# Patient Record
Sex: Male | Born: 1950 | Race: White | Hispanic: No | Marital: Married | Smoking: Never smoker
Health system: Southern US, Community
[De-identification: ages and names within clinical notes are randomized; demographics above are authoritative.]

## PROBLEM LIST (undated history)

## (undated) DIAGNOSIS — G894 Chronic pain syndrome: Secondary | ICD-10-CM

## (undated) DIAGNOSIS — M5430 Sciatica, unspecified side: Secondary | ICD-10-CM

## (undated) DIAGNOSIS — I10 Essential (primary) hypertension: Secondary | ICD-10-CM

## (undated) DIAGNOSIS — N509 Disorder of male genital organs, unspecified: Secondary | ICD-10-CM

## (undated) DIAGNOSIS — M545 Low back pain: Secondary | ICD-10-CM

## (undated) HISTORY — PX: CYSTOSCOPY WITH INSERTION OF UROLIFT: SHX6678

## (undated) HISTORY — DX: Sciatica, unspecified side: M54.30

## (undated) HISTORY — DX: Low back pain: M54.5

## (undated) HISTORY — DX: Chronic pain syndrome: G89.4

## (undated) HISTORY — DX: Disorder of male genital organs, unspecified: N50.9

---

## 2004-01-06 ENCOUNTER — Ambulatory Visit: Payer: Self-pay | Admitting: Pain Medicine

## 2005-01-04 ENCOUNTER — Ambulatory Visit: Payer: Self-pay | Admitting: Pain Medicine

## 2005-08-16 DIAGNOSIS — N509 Disorder of male genital organs, unspecified: Secondary | ICD-10-CM | POA: Insufficient documentation

## 2005-08-16 HISTORY — DX: Disorder of male genital organs, unspecified: N50.9

## 2006-01-03 ENCOUNTER — Ambulatory Visit: Payer: Self-pay | Admitting: Pain Medicine

## 2006-01-27 ENCOUNTER — Other Ambulatory Visit: Payer: Self-pay

## 2006-01-27 ENCOUNTER — Emergency Department: Payer: Self-pay | Admitting: Emergency Medicine

## 2006-10-21 ENCOUNTER — Emergency Department: Payer: Self-pay

## 2006-12-05 ENCOUNTER — Ambulatory Visit: Payer: Self-pay | Admitting: Pain Medicine

## 2007-11-13 ENCOUNTER — Ambulatory Visit: Payer: Self-pay | Admitting: Pain Medicine

## 2007-11-15 ENCOUNTER — Ambulatory Visit: Payer: Self-pay | Admitting: Pain Medicine

## 2008-03-23 ENCOUNTER — Inpatient Hospital Stay: Payer: Self-pay | Admitting: Internal Medicine

## 2008-03-24 ENCOUNTER — Encounter: Payer: Self-pay | Admitting: Internal Medicine

## 2008-11-18 ENCOUNTER — Ambulatory Visit: Payer: Self-pay | Admitting: Pain Medicine

## 2009-01-17 DIAGNOSIS — M545 Low back pain, unspecified: Secondary | ICD-10-CM

## 2009-01-17 DIAGNOSIS — G8929 Other chronic pain: Secondary | ICD-10-CM | POA: Insufficient documentation

## 2009-01-17 HISTORY — DX: Low back pain, unspecified: M54.50

## 2009-02-01 HISTORY — PX: BACK SURGERY: SHX140

## 2009-02-04 ENCOUNTER — Ambulatory Visit: Payer: Self-pay

## 2009-02-26 ENCOUNTER — Inpatient Hospital Stay (HOSPITAL_COMMUNITY): Admission: AD | Admit: 2009-02-26 | Discharge: 2009-03-01 | Payer: Self-pay | Admitting: Neurosurgery

## 2009-11-05 ENCOUNTER — Ambulatory Visit: Payer: Self-pay | Admitting: Pain Medicine

## 2009-11-11 ENCOUNTER — Other Ambulatory Visit: Payer: Self-pay | Admitting: Pain Medicine

## 2010-01-19 LAB — HM COLONOSCOPY

## 2010-04-20 LAB — COMPREHENSIVE METABOLIC PANEL
ALT: 85 U/L — ABNORMAL HIGH (ref 0–53)
AST: 31 U/L (ref 0–37)
AST: 63 U/L — ABNORMAL HIGH (ref 0–37)
BUN: 13 mg/dL (ref 6–23)
CO2: 24 mEq/L (ref 19–32)
CO2: 25 mEq/L (ref 19–32)
CO2: 25 mEq/L (ref 19–32)
Calcium: 8.2 mg/dL — ABNORMAL LOW (ref 8.4–10.5)
Calcium: 8.4 mg/dL (ref 8.4–10.5)
Chloride: 109 mEq/L (ref 96–112)
Creatinine, Ser: 0.68 mg/dL (ref 0.4–1.5)
Creatinine, Ser: 0.76 mg/dL (ref 0.4–1.5)
Creatinine, Ser: 0.9 mg/dL (ref 0.4–1.5)
GFR calc Af Amer: >60 mL/min (ref >60)
GFR calc non Af Amer: >60 mL/min (ref >60)
GFR calc non Af Amer: >60 mL/min (ref >60)
GFR calc non Af Amer: >60 mL/min (ref >60)
Glucose, Bld: 121 mg/dL — ABNORMAL HIGH (ref 70–99)
Total Bilirubin: 0.4 mg/dL (ref 0.3–1.2)
Total Protein: 6.4 g/dL (ref 6.0–8.3)

## 2010-04-20 LAB — CBC
HCT: 37.1 % — ABNORMAL LOW (ref 39.0–52.0)
Hemoglobin: 12.4 g/dL — ABNORMAL LOW (ref 13.0–17.0)
Hemoglobin: 13.5 g/dL (ref 13.0–17.0)
MCHC: 33.4 g/dL (ref 30.0–36.0)
MCHC: 34.1 g/dL (ref 30.0–36.0)
MCHC: 35.5 g/dL (ref 30.0–36.0)
MCV: 89.2 fL (ref 78.0–100.0)
MCV: 90.7 fL (ref 78.0–100.0)
Platelets: 263 10*3/uL (ref 150–400)
Platelets: 267 10*3/uL (ref 150–400)
RBC: 4.35 MIL/uL (ref 4.22–5.81)
RDW: 13.7 % (ref 11.5–15.5)
WBC: 12.8 10*3/uL — ABNORMAL HIGH (ref 4.0–10.5)
WBC: 5.2 10*3/uL (ref 4.0–10.5)

## 2010-04-20 LAB — CULTURE, BLOOD (ROUTINE X 2)

## 2010-04-20 LAB — URINALYSIS, ROUTINE W REFLEX MICROSCOPIC
Bilirubin Urine: NEGATIVE
Hgb urine dipstick: NEGATIVE
Ketones, ur: 15 mg/dL — AB
Nitrite: NEGATIVE
Protein, ur: NEGATIVE mg/dL
Specific Gravity, Urine: 1.014 (ref 1.005–1.030)
Urobilinogen, UA: 1 mg/dL (ref 0.0–1.0)

## 2010-04-20 LAB — DIFFERENTIAL
Eosinophils Absolute: 0 10*3/uL (ref 0.0–0.7)
Eosinophils Absolute: 0.1 10*3/uL (ref 0.0–0.7)
Eosinophils Relative: 0 % (ref 0–5)
Eosinophils Relative: 2 % (ref 0–5)
Lymphs Abs: 0.9 10*3/uL (ref 0.7–4.0)
Lymphs Abs: 1.5 10*3/uL (ref 0.7–4.0)
Monocytes Relative: 14 % — ABNORMAL HIGH (ref 3–12)
Monocytes Relative: 6 % (ref 3–12)

## 2010-04-20 LAB — HEPATITIS PANEL, ACUTE: Hepatitis B Surface Ag: NEGATIVE

## 2010-04-20 LAB — SEDIMENTATION RATE: Sed Rate: 32 mm/hr — ABNORMAL HIGH (ref 0–16)

## 2010-10-12 ENCOUNTER — Ambulatory Visit: Payer: Self-pay | Admitting: Pain Medicine

## 2010-10-13 ENCOUNTER — Other Ambulatory Visit: Payer: Self-pay | Admitting: Pain Medicine

## 2011-10-12 ENCOUNTER — Ambulatory Visit: Payer: Self-pay | Admitting: Pain Medicine

## 2011-10-14 ENCOUNTER — Other Ambulatory Visit: Payer: Self-pay | Admitting: Pain Medicine

## 2011-10-14 LAB — HEPATIC FUNCTION PANEL A (ARMC)
Alkaline Phosphatase: 61 U/L (ref 50–136)
SGOT(AST): 26 U/L (ref 15–37)
SGPT (ALT): 25 U/L (ref 12–78)
Total Protein: 7 g/dL (ref 6.4–8.2)

## 2011-10-14 LAB — RENAL FUNCTION PANEL
Albumin: 3.9 g/dL (ref 3.4–5.0)
BUN: 16 mg/dL (ref 7–18)
Chloride: 105 mmol/L (ref 98–107)
Co2: 27 mmol/L (ref 21–32)
Phosphorus: 3.3 mg/dL (ref 2.5–4.9)

## 2012-10-09 ENCOUNTER — Ambulatory Visit: Payer: Self-pay | Admitting: Pain Medicine

## 2013-04-06 ENCOUNTER — Ambulatory Visit: Payer: Self-pay | Admitting: Pain Medicine

## 2013-10-05 ENCOUNTER — Ambulatory Visit: Payer: Self-pay | Admitting: Pain Medicine

## 2014-08-26 ENCOUNTER — Ambulatory Visit (INDEPENDENT_AMBULATORY_CARE_PROVIDER_SITE_OTHER): Payer: Managed Care, Other (non HMO) | Admitting: Family Medicine

## 2014-08-26 ENCOUNTER — Encounter: Payer: Self-pay | Admitting: Family Medicine

## 2014-08-26 VITALS — BP 134/74 | HR 68 | Temp 98.9°F | Resp 16 | Wt 199.2 lb

## 2014-08-26 DIAGNOSIS — K648 Other hemorrhoids: Secondary | ICD-10-CM | POA: Diagnosis not present

## 2014-08-26 DIAGNOSIS — G894 Chronic pain syndrome: Secondary | ICD-10-CM | POA: Insufficient documentation

## 2014-08-26 DIAGNOSIS — Z7189 Other specified counseling: Secondary | ICD-10-CM

## 2014-08-26 DIAGNOSIS — M5430 Sciatica, unspecified side: Secondary | ICD-10-CM | POA: Insufficient documentation

## 2014-08-26 DIAGNOSIS — K644 Residual hemorrhoidal skin tags: Secondary | ICD-10-CM

## 2014-08-26 MED ORDER — HYDROCORTISONE ACETATE 25 MG RE SUPP: 25.0000 mg | Freq: Two times a day (BID) | RECTAL | Status: DC

## 2014-08-26 NOTE — Patient Instructions (Signed)

## 2014-08-26 NOTE — Progress Notes (Signed)
Patient ID: Dylan [name], male   DOB: July 22, 1950, 64 y.o.   MRN: [id number]       Patient: Dylan [name] Male    DOB: 1950/10/03   64 y.o.   MRN: [id number] Visit Date: 08/26/2014  Today's Provider: Dortha Kern, PA   Chief Complaint  Patient presents with  . Hemorrhoids    noticed one week ago. slight discomfort.  . Hypertension    last six months.   Subjective:    HPI  This 64 year old male noticed some stool soiling with passing gas and irritation to external hemorrhoids the past week. No bleeding. Also, worried about BP elevation to 140-145 systolic occasionally. Diastolic readings usually in the 70-80 range. No family history of hypertension. Does not smoke or use excessive salt or caffeine.  Past Medical History  Diagnosis Date  . 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 Pain syndrome, chronic   . Sciatica   . Lumbago 01/17/2009   Past Surgical History  Procedure Laterality Date  . Back surgery  02/2009    ruptured disk   No family history on file. History  Substance Use Topics  . Smoking status: Never Smoker   . Smokeless tobacco: Not on file  . Alcohol Use: Yes     Comment: rarely alcohol   No Known Allergies    Previous Medications   DUTASTERIDE-TAMSULOSIN HCL (JALYN) 0.5-0.4 MG CAPS    Take 1 capsule by mouth daily.   GABAPENTIN, ONCE-DAILY, 300 MG TABS    Take 1 tablet by mouth 3 (three) times daily.   LATANOPROST (XALATAN) 0.005 % OPHTHALMIC SOLUTION    Apply to eye.   TIMOLOL MALEATE OP    Apply to eye.   TRAMADOL (ULTRAM) 50 MG TABLET    Take 1 tablet by mouth QID.   Review of Systems  Constitutional: Negative.   Respiratory: Negative.   Cardiovascular: Negative.   Gastrointestinal: Positive for rectal pain. Negative for constipation and anal bleeding.   Objective:   BP 134/74 mmHg  Pulse 68  Temp(Src) 98.9 F (37.2 C) (Oral)  Resp 16  Wt 199 lb 3.2 oz (90.357 kg)  Physical Exam  Constitutional: He is oriented to person, place, and  time. He appears well-developed and well-nourished. No distress.  HENT:  Head: Normocephalic and atraumatic.  Right Ear: Hearing normal.  Left Ear: Hearing normal.  Nose: Nose normal.  Eyes: Conjunctivae and lids are normal. Right eye exhibits no discharge. Left eye exhibits no discharge. No scleral icterus.  Pulmonary/Chest: Effort normal. No respiratory distress.  Abdominal: Soft. Bowel sounds are normal.  Genitourinary: Rectal exam shows external hemorrhoid.     Musculoskeletal: Normal range of motion.  Neurological: He is alert and oriented to person, place, and time.  Skin: Skin is intact. No lesion and no rash noted.  Psychiatric: He has a normal mood and affect. His speech is normal and behavior is normal. Thought content normal.      Assessment & Plan:     1. External hemorrhoid Some irritation without bleeding to external hemorrhoids. Has been trying Tuck's Pads for cleaning area and it helps. No sign of clots and will treat with hot sitz baths and hydrocortisone suppositories at bedtime. Recheck in 5-7 days if no better. Should use Probiotic and Fiber to maintain normal soft stools. - hydrocortisone (ANUSOL-HC) 25 MG suppository; Place 1 suppository (25 mg total) rectally 2 (two) times daily.  Dispense: 12 suppository; Refill: 0  2. Counseling on health promotion and disease  prevention Has notice some variability to blood pressure at different offices (pain management, ophthalmologist, etc.) and concerned about possible hypertension. Normal reading in the office today. Counseled regarding sodium restriction, no tobacco use and no decongestant use. Monitor for further changes. Denies family history of hypertension. (Counseling 15-20 minutes).      Dortha Kern, PA  [facility]

## 2015-01-20 ENCOUNTER — Other Ambulatory Visit: Payer: Self-pay | Admitting: Pain Medicine

## 2015-01-20 ENCOUNTER — Encounter: Payer: Self-pay | Admitting: Pain Medicine

## 2015-01-20 ENCOUNTER — Ambulatory Visit: Payer: Managed Care, Other (non HMO) | Attending: Pain Medicine | Admitting: Pain Medicine

## 2015-01-20 VITALS — BP 140/76 | HR 77 | Temp 98.4°F | Resp 16 | Ht 70.0 in | Wt 200.0 lb

## 2015-01-20 DIAGNOSIS — N509 Disorder of male genital organs, unspecified: Secondary | ICD-10-CM | POA: Diagnosis not present

## 2015-01-20 DIAGNOSIS — R52 Pain, unspecified: Secondary | ICD-10-CM

## 2015-01-20 DIAGNOSIS — Z79899 Other long term (current) drug therapy: Secondary | ICD-10-CM

## 2015-01-20 DIAGNOSIS — R1032 Left lower quadrant pain: Secondary | ICD-10-CM

## 2015-01-20 DIAGNOSIS — N5082 Scrotal pain: Secondary | ICD-10-CM | POA: Diagnosis present

## 2015-01-20 DIAGNOSIS — Z79891 Long term (current) use of opiate analgesic: Secondary | ICD-10-CM | POA: Diagnosis not present

## 2015-01-20 DIAGNOSIS — M792 Neuralgia and neuritis, unspecified: Secondary | ICD-10-CM

## 2015-01-20 DIAGNOSIS — M545 Low back pain: Secondary | ICD-10-CM | POA: Insufficient documentation

## 2015-01-20 DIAGNOSIS — F1190 Opioid use, unspecified, uncomplicated: Secondary | ICD-10-CM | POA: Insufficient documentation

## 2015-01-20 DIAGNOSIS — G8929 Other chronic pain: Secondary | ICD-10-CM | POA: Diagnosis not present

## 2015-01-20 DIAGNOSIS — Z7189 Other specified counseling: Secondary | ICD-10-CM

## 2015-01-20 DIAGNOSIS — N50819 Testicular pain, unspecified: Secondary | ICD-10-CM | POA: Diagnosis not present

## 2015-01-20 DIAGNOSIS — R1030 Lower abdominal pain, unspecified: Secondary | ICD-10-CM

## 2015-01-20 MED ORDER — TRAMADOL HCL 50 MG PO TABS: 50.0000 mg | ORAL_TABLET | Freq: Four times a day (QID) | ORAL | Status: DC | PRN

## 2015-01-20 MED ORDER — GABAPENTIN (ONCE-DAILY) 300 MG PO TABS: 1.0000 | ORAL_TABLET | Freq: Three times a day (TID) | ORAL | Status: DC

## 2015-01-20 NOTE — Progress Notes (Signed)
Safety precautions to be maintained throughout the outpatient stay will include: orient to surroundings, keep bed in low position, maintain call bell within reach at all times, provide assistance with transfer out of bed and ambulation.  Tramadol tablets 50 mg  Count is 26 tablets.

## 2015-01-20 NOTE — Progress Notes (Signed)
Patient's Name: Dylan [name] MRN: [id number] DOB: 1950/02/18 DOS: 01/20/2015  Primary Reason(s) for Visit: Encounter for Medication Management CC: Testicle Pain   HPI:    Dylan [name] is a 64 y.o. year old, male patient, who returns today as an established patient. 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 His primarily concern today is the Testicle Pain     The patient returns to the clinic today for pharmacological management of his chronic pain. He indicates that therapy to be working well.  Today's Pain Score: 1  Reported level of pain is compatible with clinical observation Pain Type: Chronic pain Pain Location: Scrotum Pain Orientation: Left Pain Descriptors / Indicators: Aching, Sharp, Throbbing Pain Frequency: Intermittent  Last evaluation: 08/15/2014  Purpose: Pharmacological management of the pain.  Pharmacotherapy Review:   Side-effects or Adverse reactions: None reported Effectiveness: Described as relatively effective, allowing for increase in activities of daily living (ADL) Onset of action: Within expected pharmacological parameters Duration of action: Within normal limits for medication Peak effect: Timing and results are as within normal expected parameters Dylan [name] PMP: Compliant with practice rules and regulations UDS Results: No recent UDS. UDS Interpretation: No UDS available, at this time Medication Assessment Form: Reviewed. Patient indicates being compliant with therapy Treatment compliance: Compliant Substance Use Disorder (SUD) Risk Level:  Low Pharmacologic Plan: Continue therapy as is  Lab Work: Inflammation Markers Lab Results  Component Value Date   ESRSEDRATE * 02/26/2009    32 CORRECTED ON 01/27 AT 0134: PREVIOUSLY REPORTED AS 26    Renal Function Lab Results  Component Value Date   BUN 16 10/14/2011   CREATININE 0.80 10/14/2011   GFRAA >60 10/14/2011   GFRNONAA >60 10/14/2011    Hepatic Function Lab Results  Component Value Date   AST 26 10/14/2011   ALT 25 10/14/2011   ALBUMIN 3.9 10/14/2011    Electrolytes Lab Results  Component Value Date   NA 139 10/14/2011   K 4.1 10/14/2011   CL 105 10/14/2011   CALCIUM 8.5 10/14/2011    Illicit Drugs No results found for: THCU, COCAINSCRNUR, PCPSCRNUR, MDMA, AMPHETMU, METHADONE, ETOH   Allergies:  Dylan [name] has No Known Allergies.  Meds:  The patient has a current medication list which includes the following prescription(s): latanoprost, gabapentin (once-daily), and tramadol. Requested Prescriptions   Signed Prescriptions Disp Refills  . traMADol (ULTRAM) 50 MG tablet 120 tablet 5    Sig: Take 1 tablet (50 mg total) by mouth every 6 (six) hours as needed for moderate pain or severe pain.  . Gabapentin, Once-Daily, 300 MG TABS 90 tablet 5    Sig: Take 1 tablet by mouth 3 (three) times daily.    ROS:  Constitutional: Afebrile, no chills, well hydrated and well nourished Gastrointestinal: negative Musculoskeletal:negative Neurological: negative Behavioral/Psych: negative  PFSH:  Medical:  Dylan [name]  has a past medical history of Genital disorder, male (08/16/2005); Pain syndrome, chronic; Sciatica; and Lumbago (01/17/2009). Family: family history is not on file. Surgical:  has past surgical history that includes Back surgery (02/2009). Tobacco:  reports that he has never smoked. He does not have any smokeless tobacco history on file.  Alcohol:  reports that he drinks alcohol. Drug:  has no drug history on file.  Physical Exam:  Vitals:   Today's Vitals   01/20/15 1337 01/20/15 1340  BP: 140/76   Pulse: 77   Temp: 98.4 F (36.9 C)   TempSrc: Oral   Resp: 16   Height:  (1.778 m)   Weight: 200 lb (90.719 kg)   SpO2: 100%   PainSc: 1  1   PainLoc: Scrotum   Calculated BMI: Body mass index is 28.7 kg/(m^2). General appearance: alert, cooperative, appears stated age and no distress Eyes: PERLA Respiratory: No evidence respiratory distress, no audible rales or ronchi and no use of accessory muscles of respiration Neck: no adenopathy, no carotid bruit, no JVD, supple, symmetrical, trachea midline and thyroid not enlarged, symmetric, no tenderness/mass/nodules  Cervical Spine ROM: Adequate for flexion, extension, rotation, and lateral bending Palpation: No palpable trigger points  Upper Extremities ROM: Adequate bilaterally Strength: 5/5 for all flexors and extensors of the upper extremity, bilaterally Pulses: Palpable bilaterally Neurologic: No allodynia, No hyperesthesia, No hyperpathia and No sensory abnormalities  Lumbar Spine ROM: Adequate for flexion, extension, rotation, and lateral bending Palpation: No palpable trigger points Lumbar Hyperextension and rotation: Non-contributory Patrick's Maneuver: Non-contributory  Lower Extremities ROM: Adequate bilaterally Strength: 5/5 for all flexors and extensors of the lower extremity, bilaterally Pulses: Palpable bilaterally Neurologic: No allodynia, No hyperesthesia, No hyperpathia, No sensory abnormalities and No antalgic gait or posture  Assessment:  Encounter Diagnosis:  Primary Diagnosis: Chronic pain [G89.29]  Plan:  Interventional Therapies: None at this time.  Dylan [name] was seen today for testicle pain.  Diagnoses and all orders for this visit:  Chronic pain -     B12 and Folate Panel; Future -     COMPLETE METABOLIC PANEL WITH GFR; Future -     C-reactive protein; Future -     Magnesium; Future -     Sedimentation rate; Future -      Vitamin D2,D3 Panel; Future -     traMADol (ULTRAM) 50 MG tablet; Take 1 tablet (50 mg total) by mouth every 6 (six) hours as needed for moderate pain or severe pain. -     Gabapentin, Once-Daily, 300 MG TABS; Take 1 tablet by mouth 3 (three) times daily.  Orchialgia  Testicular pain  Disorder of male genital organs  Long term current use of opiate analgesic -     Drugs of abuse screen w/o alc, rtn urine-sln  Long term prescription opiate use  Opiate use  Encounter for long-term current use of medication -     Drugs of abuse screen w/o alc, rtn urine-sln  Encounter for chronic pain management  Neuropathic pain -     Gabapentin, Once-Daily, 300 MG TABS; Take 1 tablet by mouth 3 (three) times daily.  Neurogenic pain -     Gabapentin, Once-Daily, 300 MG TABS; Take 1 tablet by mouth 3 (three) times daily.  Visceral pain -     Gabapentin, Once-Daily, 300 MG TABS; Take 1 tablet by mouth 3 (three) times daily.  Chronic pain of left groin     There are no Patient Instructions on file for this visit. Medications discontinued today:  Medications Discontinued During This Encounter  Medication Reason  . traMADol (ULTRAM) 50 MG tablet Reorder  . Gabapentin, Once-Daily, 300 MG TABS Reorder  . Dutasteride-Tamsulosin HCl (JALYN) 0.5-0.4 MG CAPS Error  . hydrocortisone (ANUSOL-HC) 25 MG suppository Error  . TIMOLOL MALEATE OP Error   Medications  administered today:  Dylan [name] had no medications administered during this visit.  Primary Care Physician: Dortha Kern, PA Location: [facility] Note by: Sydnee Levans. Laban Emperor, M.D, DABA, DABAPM, DABPM, DABIPP, FIPP

## 2015-01-21 ENCOUNTER — Telehealth: Payer: Self-pay | Admitting: Pain Medicine

## 2015-01-21 NOTE — Telephone Encounter (Signed)
Script they received Gabapentin 1 day 300mg  tab extended release 3 times a day Did you mean Reg Gabapentin 300mg  3 times a day ? Please verify which med and instructions

## 2015-01-22 ENCOUNTER — Other Ambulatory Visit: Payer: Self-pay | Admitting: Pain Medicine

## 2015-01-22 DIAGNOSIS — M792 Neuralgia and neuritis, unspecified: Secondary | ICD-10-CM

## 2015-01-22 DIAGNOSIS — R52 Pain, unspecified: Secondary | ICD-10-CM

## 2015-01-22 DIAGNOSIS — G8929 Other chronic pain: Secondary | ICD-10-CM

## 2015-01-22 MED ORDER — GABAPENTIN 300 MG PO CAPS: 300.0000 mg | ORAL_CAPSULE | Freq: Three times a day (TID) | ORAL | Status: DC

## 2015-01-22 NOTE — Telephone Encounter (Signed)
Spoke with Dr. Laban EmperorNaveira, directions are 300mg  tid. Pharmacy notified.

## 2015-01-23 ENCOUNTER — Other Ambulatory Visit
Admission: RE | Admit: 2015-01-23 | Discharge: 2015-01-23 | Disposition: A | Discharge status: Home / Self Care | Payer: Managed Care, Other (non HMO) | Source: Ambulatory Visit | Attending: Pain Medicine | Admitting: Pain Medicine

## 2015-01-23 DIAGNOSIS — Z029 Encounter for administrative examinations, unspecified: Secondary | ICD-10-CM | POA: Insufficient documentation

## 2015-01-23 LAB — COMPREHENSIVE METABOLIC PANEL
ALBUMIN: 4.4 g/dL (ref 3.5–5.0)
ALT: 16 U/L — ABNORMAL LOW (ref 17–63)
ANION GAP: 5 (ref 5–15)
AST: 21 U/L (ref 15–41)
Alkaline Phosphatase: 66 U/L (ref 38–126)
BILIRUBIN TOTAL: 0.2 mg/dL — AB (ref 0.3–1.2)
BUN: 17 mg/dL (ref 6–20)
CHLORIDE: 105 mmol/L (ref 101–111)
CO2: 24 mmol/L (ref 22–32)
Calcium: 8.5 mg/dL — ABNORMAL LOW (ref 8.9–10.3)
Creatinine, Ser: 0.78 mg/dL (ref 0.61–1.24)
GFR calc Af Amer: >60 mL/min (ref >60)
GFR calc non Af Amer: >60 mL/min (ref >60)
GLUCOSE: 107 mg/dL — AB (ref 65–99)
POTASSIUM: 4 mmol/L (ref 3.5–5.1)
SODIUM: 134 mmol/L — AB (ref 135–145)
TOTAL PROTEIN: 7.1 g/dL (ref 6.5–8.1)

## 2015-01-23 LAB — MAGNESIUM: Magnesium: 2.2 mg/dL (ref 1.7–2.4)

## 2015-01-23 LAB — SEDIMENTATION RATE: Sed Rate: 9 mm/hr (ref 0–20)

## 2015-01-24 LAB — VITAMIN B12: VITAMIN B 12: 326 pg/mL (ref 180–914)

## 2015-01-24 LAB — VITAMIN D 25 HYDROXY (VIT D DEFICIENCY, FRACTURES): Vit D, 25-Hydroxy: 22.3 ng/mL — ABNORMAL LOW (ref 30.0–100.0)

## 2015-01-27 LAB — TOXASSURE SELECT 13 (MW), URINE: PDF: 0

## 2015-01-30 NOTE — Progress Notes (Signed)

## 2015-02-04 ENCOUNTER — Telehealth: Payer: Self-pay

## 2015-02-04 NOTE — Telephone Encounter (Signed)
LMTCB

## 2015-02-04 NOTE — Telephone Encounter (Signed)
-----   Message from Jodell CiproDennis E Council Hillhrismon, [place] sent at 01/31/2015  4:20 PM EST ----- Agree with recommendation by Dr. Laban EmperorNaveira regarding need for OTC Calcium 1200 mg plus Vitamin-D3 1000 IU daily and recheck levels as he suggests to check progress.

## 2015-02-06 NOTE — Telephone Encounter (Signed)
Pt returned call. Thanks TNP °

## 2015-02-07 NOTE — Telephone Encounter (Signed)
Patient advised as directed below. Patient verbalized understanding and agrees with plan of care.  

## 2015-02-07 NOTE — Telephone Encounter (Signed)
Pt returned your call. ° °Thanks, Teri  °

## 2015-07-21 ENCOUNTER — Ambulatory Visit: Payer: Managed Care, Other (non HMO) | Attending: Pain Medicine | Admitting: Pain Medicine

## 2015-07-21 ENCOUNTER — Encounter: Payer: Self-pay | Admitting: Pain Medicine

## 2015-07-21 VITALS — BP 147/78 | HR 66 | Temp 98.2°F | Resp 16 | Ht 71.0 in | Wt 200.0 lb

## 2015-07-21 DIAGNOSIS — M792 Neuralgia and neuritis, unspecified: Secondary | ICD-10-CM

## 2015-07-21 DIAGNOSIS — N50819 Testicular pain, unspecified: Secondary | ICD-10-CM

## 2015-07-21 DIAGNOSIS — Z6827 Body mass index (BMI) 27.0-27.9, adult: Secondary | ICD-10-CM | POA: Diagnosis not present

## 2015-07-21 DIAGNOSIS — M545 Low back pain: Secondary | ICD-10-CM | POA: Insufficient documentation

## 2015-07-21 DIAGNOSIS — Z79891 Long term (current) use of opiate analgesic: Secondary | ICD-10-CM | POA: Diagnosis not present

## 2015-07-21 DIAGNOSIS — R1032 Left lower quadrant pain: Secondary | ICD-10-CM | POA: Diagnosis not present

## 2015-07-21 DIAGNOSIS — R52 Pain, unspecified: Secondary | ICD-10-CM

## 2015-07-21 DIAGNOSIS — Z79899 Other long term (current) drug therapy: Secondary | ICD-10-CM | POA: Diagnosis not present

## 2015-07-21 DIAGNOSIS — G8929 Other chronic pain: Secondary | ICD-10-CM | POA: Diagnosis not present

## 2015-07-21 DIAGNOSIS — R1030 Lower abdominal pain, unspecified: Secondary | ICD-10-CM | POA: Diagnosis not present

## 2015-07-21 MED ORDER — TRAMADOL HCL 50 MG PO TABS: 50.0000 mg | ORAL_TABLET | Freq: Four times a day (QID) | ORAL | Status: DC | PRN

## 2015-07-21 MED ORDER — GABAPENTIN (ONCE-DAILY) 300 MG PO TABS: 1.0000 | ORAL_TABLET | Freq: Three times a day (TID) | ORAL | Status: DC

## 2015-07-21 NOTE — Progress Notes (Signed)
Medication count:   Tramadol   #55/120

## 2015-07-21 NOTE — Progress Notes (Signed)
Patient's Name: Dylan [name]  Patient type: Established  MRN: [id number]  Service setting: Ambulatory outpatient  DOB: Jul 07, 1950  Location: [facility]  DOS: 07/21/2015  Primary Care Physician: Dortha Kern, PA  Note by: Sydnee Levans. Laban Emperor, M.D, DABA, DABAPM, DABPM, DABIPP, FIPP  Referring Physician: Tamsen Roers, PA  Specialty: Board-Certified Interventional Pain Management  Last Visit to Pain Management: Visit date not found   Primary Reason(s) for Visit: Encounter for prescription drug management (Level of risk: moderate) CC: Testicle Pain   HPI  Dylan [name] is a 65 y.o. year old, male patient, who returns today as an established patient. 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 His primarily concern today is the Testicle Pain   Pain Assessment: Self-Reported Pain Score: 2  Reported level is compatible with observation       Pain Type: Chronic pain Pain Location: Scrotum Pain Orientation: Left Pain Descriptors / Indicators: Aching, Sharp, Sore Pain Frequency: Intermittent  The patient comes into the clinics today for pharmacological management of his chronic pain. I last saw this patient on Visit date not found. The patient  has no drug history on file. His body mass index is 27.91 kg/(m^2).  Date of Last Visit: 01/21/15 Service Provided on Last Visit: Med Refill  Controlled Substance Pharmacotherapy Assessment & REMS (Risk Evaluation and Mitigation Strategy)  Analgesic: Tramadol 50 mg every 6 hours (200 mg/day) Pill Count: Tramadol  #55/120 MME/day: 20 mg/day Pharmacokinetics: Onset of action (Liberation/Absorption): Within expected pharmacological parameters Time to Peak effect (Distribution): Timing and results are as within normal expected parameters Duration of action (Metabolism/Excretion): Within normal limits for medication Pharmacodynamics: Analgesic Effect: More than 50% Activity Facilitation: Medication(s) allow patient to sit, stand, walk, and do the basic ADLs Perceived Effectiveness: Described as relatively effective, allowing for increase in activities of daily living (ADL) Side-effects or Adverse reactions: None reported Monitoring: [place] PMP: Online review of the past 50-month period conducted. Compliant with practice rules and regulations Last UDS on record: TOXASSURE SELECT 13  Date Value Ref Range Status  01/20/2015 FINAL  Final    Comment:    ==================================================================== TOXASSURE SELECT 13 (MW) ==================================================================== Test                             Result       Flag       Units Drug Present and Declared for Prescription Verification   Tramadol                       PRESENT      EXPECTED   O-Desmethyltramadol            PRESENT      EXPECTED   N-Desmethyltramadol            PRESENT      EXPECTED    Source of tramadol is a prescription medication.    O-desmethyltramadol and N-desmethyltramadol are expected    metabolites of tramadol. ==================================================================== Test  Result    Flag   Units      Ref Range   Creatinine              76               mg/dL      >=16 ==================================================================== Declared Medications:  The flagging and interpretation on this report are based on the  following declared medications.  Unexpected results may arise from  inaccuracies in the declared medications.  **Note: The testing  scope of this panel includes these medications:  Tramadol  **Note: The testing scope of this panel does not include following  reported medications:  Gabapentin ==================================================================== For clinical consultation, please call [phone]. ====================================================================    UDS interpretation: Compliant          Medication Assessment Form: Reviewed. Patient indicates being compliant with therapy Treatment compliance: Compliant Risk Assessment: Aberrant Behavior: None observed today Substance Use Disorder (SUD) Risk Level: Low Risk of opioid abuse or dependence: 0.7-3.0% with doses ? 36 MME/day and 6.1-26% with doses ? 120 MME/day. Opioid Risk Tool (ORT) Score: Total Score: 0 Low Risk for SUD (Score <3) Depression Scale Score: PHQ-2: PHQ-2 Total Score: 0 No depression (0) PHQ-9: PHQ-9 Total Score: 0 No depression (0-4)  Pharmacologic Plan: No change in therapy, at this time  Laboratory Chemistry  Inflammation Markers Lab Results  Component Value Date   ESRSEDRATE 9 01/23/2015    Renal Function Lab Results  Component Value Date   BUN 17 01/23/2015   CREATININE 0.78 01/23/2015   GFRAA >60 01/23/2015   GFRNONAA >60 01/23/2015    Hepatic Function Lab Results  Component Value Date   AST 21 01/23/2015   ALT 16* 01/23/2015   ALBUMIN 4.4 01/23/2015    Electrolytes Lab Results  Component Value Date   NA 134* 01/23/2015   K 4.0 01/23/2015   CL 105 01/23/2015   CALCIUM 8.5* 01/23/2015   MG 2.2 01/23/2015    Pain Modulating Vitamins Lab Results  Component Value Date   VD25OH 22.3* 01/23/2015   VITAMINB12 326 01/23/2015    Coagulation Parameters Lab Results  Component Value Date   PLT 267 03/01/2009    Note: Labs Reviewed.  Recent Diagnostic Imaging  No results found.  Meds  The patient has a current medication list which includes the following prescription(s): gabapentin  (once-daily), latanoprost, and tramadol.  Current Outpatient Prescriptions on File Prior to Visit  Medication Sig  . latanoprost (XALATAN) 0.005 % ophthalmic solution Apply to eye.   No current facility-administered medications on file prior to visit.    ROS  Constitutional: Denies any fever or chills Gastrointestinal: No reported hemesis, hematochezia, vomiting, or acute GI distress Musculoskeletal: Denies any acute onset joint swelling, redness, loss of ROM, or weakness Neurological: No reported episodes of acute onset apraxia, aphasia, dysarthria, agnosia, amnesia, paralysis, loss of coordination, or loss of consciousness  Allergies  Mr. Germond has No Known Allergies.  PFSH  Medical:  Mr. Eastham  has a past medical history of Genital disorder, male (08/16/2005); Pain syndrome, chronic; Sciatica; and Lumbago (01/17/2009). Family: family history is not on file. Surgical:  has past surgical history that includes Back surgery (02/2009). Tobacco:  reports that he has never smoked. He does not have any smokeless tobacco history on file. Alcohol:  reports that he drinks alcohol. Drug:  has no drug history on file.  Constitutional Exam  Vitals: Blood pressure 147/78, pulse 66, temperature 98.2 F (36.8 C), temperature source  Oral, resp. rate 16, height 5\' 11"  (1.803 m), weight 200 lb (90.719 kg), SpO2 98 %. General appearance: Well nourished, well developed, and well hydrated. In no acute distress Calculated BMI/Body habitus: Body mass index is 27.91 kg/(m^2). (25-29.9 kg/m2) Overweight - 20% higher incidence of chronic pain Psych/Mental status: Alert and oriented x 3 (person, place, & time) Eyes: PERLA Respiratory: No evidence of acute respiratory distress  Cervical Spine Exam  Inspection: No masses, redness, or swelling Alignment: Symmetrical ROM: Functional: ROM is within functional limits [facility]) Stability: No instability detected Muscle strength & Tone: Functionally  intact Sensory: Unimpaired Palpation: No complaints of tenderness  Upper Extremity (UE) Exam    Side: Right upper extremity  Side: Left upper extremity  Inspection: No masses, redness, swelling, or asymmetry  Inspection: No masses, redness, swelling, or asymmetry  ROM:  ROM:  Functional: ROM is within functional limits [facility])  Functional: ROM is within functional limits [facility])  Muscle strength & Tone: Functionally intact  Muscle strength & Tone: Functionally intact  Sensory: Unimpaired  Sensory: Unimpaired  Palpation: Non-contributory  Palpation: Non-contributory   Thoracic Spine Exam  Inspection: No masses, redness, or swelling Alignment: Symmetrical ROM: Functional: ROM is within functional limits [facility]) Stability: No instability detected Sensory: Unimpaired Muscle strength & Tone: Functionally intact Palpation: No complaints of tenderness  Lumbar Spine Exam  Inspection: No masses, redness, or swelling Alignment: Symmetrical ROM: Functional: ROM is within functional limits [facility]) Stability: No instability detected Muscle strength & Tone: Functionally intact Sensory: Unimpaired Palpation: No complaints of tenderness Provocative Tests: Lumbar Hyperextension and rotation test: deferred Patrick's Maneuver: deferred  Gait & Posture Assessment  Ambulation: Unassisted Gait: Unaffected Posture: WNL  Lower Extremity Exam    Side: Right lower extremity  Side: Left lower extremity  Inspection: No masses, redness, swelling, or asymmetry ROM:  Inspection: No masses, redness, swelling, or asymmetry ROM:  Functional: ROM is within functional limits [facility])  Functional: ROM is within functional limits [facility])  Muscle strength & Tone: Functionally intact  Muscle strength & Tone: Functionally intact  Sensory: Unimpaired  Sensory: Unimpaired  Palpation: Non-contributory  Palpation: Non-contributory   Assessment & Plan  Primary Diagnosis & Pertinent Problem List: The primary encounter  diagnosis was Chronic pain. Diagnoses of Encounter for long-term current use of medication, Long term current use of opiate analgesic, Chronic groin pain (Left), Orchialgia (Left), Neuropathic pain, Neurogenic pain, and Visceral pain were also pertinent to this visit.  Visit Diagnosis: 1. Chronic pain   2. Encounter for long-term current use of medication   3. Long term current use of opiate analgesic   4. Chronic groin pain (Left)   5. Orchialgia (Left)   6. Neuropathic pain   7. Neurogenic pain   8. Visceral pain     Problems updated and reviewed during this visit: No problems updated.  Problem-specific Plan(s): No problem-specific assessment & plan notes found for this encounter.  No new assessment & plan notes have been filed under this hospital service since the last note was generated. Service: Pain Management   Plan of Care   Problem List Items Addressed This Visit      High   Chronic groin pain (Left) (Chronic)   Chronic pain - Primary (Chronic)   Relevant Medications   traMADol (ULTRAM) 50 MG tablet   Gabapentin, Once-Daily, 300 MG TABS   Neurogenic pain (Chronic)   Relevant Medications   Gabapentin, Once-Daily, 300 MG TABS   Neuropathic pain (Chronic)   Relevant Medications   Gabapentin,  Once-Daily, 300 MG TABS   Orchialgia (Left) (Chronic)   Visceral pain (Chronic)   Relevant Medications   Gabapentin, Once-Daily, 300 MG TABS     Medium   Encounter for long-term current use of medication (Chronic)   Long term current use of opiate analgesic (Chronic)       Pharmacotherapy (Medications Ordered): Meds ordered this encounter  Medications  . traMADol (ULTRAM) 50 MG tablet    Sig: Take 1 tablet (50 mg total) by mouth every 6 (six) hours as needed for moderate pain or severe pain.    Dispense:  120 tablet    Refill:  5    Do not place this medication, or any other prescription from our practice, on "Automatic Refill". Patient may have prescription filled  one day early if pharmacy is closed on scheduled refill date. Do not fill until: 07/21/15 To last until: 01/20/16  . Gabapentin, Once-Daily, 300 MG TABS    Sig: Take 1 tablet by mouth 3 (three) times daily.    Dispense:  90 tablet    Refill:  5    Do not place this medication, or any other prescription from our practice, on "Automatic Refill". Patient may have prescription filled one day early if pharmacy is closed on scheduled refill date.    Lab-work & Procedure Ordered: No orders of the defined types were placed in this encounter.    Imaging Ordered: None  Interventional Therapies: Scheduled:  None at this time.    Considering:  None at this time.    PRN Procedures:  None at this time.    Referral(s) or Consult(s): None at this time.  New Prescriptions   No medications on file    Medications administered during this visit: Mr. Mickeal Needymrick had no medications administered during this visit.  Requested PM Follow-up: Return in about 6 months (around 01/12/2016) for Medication Management (6-Mo).  Future Appointments Date Time Provider Department Center  01/12/2016 1:40 PM Delano MetzFrancisco Shanaia Sievers, MD [facility] None    Primary Care Physician: Dortha Kernennis Chrismon, PA [facility] Note by: Sydnee LevansFrancisco A. Laban EmperorNaveira, M.D, DABA, DABAPM, DABPM, DABIPP, FIPP  Pain Score Disclaimer: We use the NRS-11 scale. This is a self-reported, subjective measurement of pain severity with only modest accuracy. It is used primarily to identify changes within a particular patient. It must be understood that outpatient pain scales are significantly less accurate that those used for research, where they can be applied under ideal controlled circumstances with minimal exposure to variables. In reality, the score is likely to be a combination of pain intensity and pain affect, where pain affect describes the degree of emotional arousal or changes in action readiness caused by the  sensory experience of pain. Factors such as social and work situation, setting, emotional state, anxiety levels, expectation, and prior pain experience may influence pain perception and show large inter-individual differences that may also be affected by time variables.  Patient instructions provided during this appointment: There are no Patient Instructions on file for this visit.

## 2016-01-12 ENCOUNTER — Encounter: Payer: Managed Care, Other (non HMO) | Admitting: Pain Medicine

## 2016-01-15 ENCOUNTER — Ambulatory Visit: Payer: Managed Care, Other (non HMO) | Attending: Pain Medicine | Admitting: Pain Medicine

## 2016-01-15 ENCOUNTER — Encounter: Payer: Self-pay | Admitting: Pain Medicine

## 2016-01-15 VITALS — BP 137/66 | HR 74 | Temp 98.0°F | Resp 16 | Ht 71.0 in | Wt 200.0 lb

## 2016-01-15 DIAGNOSIS — Z9889 Other specified postprocedural states: Secondary | ICD-10-CM | POA: Diagnosis not present

## 2016-01-15 DIAGNOSIS — Z79899 Other long term (current) drug therapy: Secondary | ICD-10-CM | POA: Insufficient documentation

## 2016-01-15 DIAGNOSIS — R1032 Left lower quadrant pain: Secondary | ICD-10-CM | POA: Diagnosis not present

## 2016-01-15 DIAGNOSIS — R52 Pain, unspecified: Secondary | ICD-10-CM

## 2016-01-15 DIAGNOSIS — M545 Low back pain, unspecified: Secondary | ICD-10-CM

## 2016-01-15 DIAGNOSIS — G8929 Other chronic pain: Secondary | ICD-10-CM

## 2016-01-15 DIAGNOSIS — G894 Chronic pain syndrome: Secondary | ICD-10-CM

## 2016-01-15 DIAGNOSIS — Z79891 Long term (current) use of opiate analgesic: Secondary | ICD-10-CM

## 2016-01-15 DIAGNOSIS — M792 Neuralgia and neuritis, unspecified: Secondary | ICD-10-CM | POA: Diagnosis not present

## 2016-01-15 DIAGNOSIS — F1190 Opioid use, unspecified, uncomplicated: Secondary | ICD-10-CM | POA: Diagnosis not present

## 2016-01-15 MED ORDER — GABAPENTIN (ONCE-DAILY) 300 MG PO TABS: 1.0000 | ORAL_TABLET | Freq: Three times a day (TID) | ORAL | 5 refills | Status: DC

## 2016-01-15 MED ORDER — TRAMADOL HCL 50 MG PO TABS: 50.0000 mg | ORAL_TABLET | Freq: Four times a day (QID) | ORAL | 5 refills | Status: DC | PRN

## 2016-01-15 MED ORDER — GABAPENTIN 300 MG PO CAPS: 300.0000 mg | ORAL_CAPSULE | Freq: Three times a day (TID) | ORAL | 5 refills | Status: DC

## 2016-01-15 NOTE — Patient Instructions (Signed)

## 2016-01-15 NOTE — Progress Notes (Signed)
Nursing Pain Medication Assessment:  Safety precautions to be maintained throughout the outpatient stay will include: orient to surroundings, keep bed in low position, maintain call bell within reach at all times, provide assistance with transfer out of bed and ambulation.  Medication Inspection Compliance: Pill count conducted under aseptic conditions, in front of the patient. Neither the pills nor the bottle was removed from the patient's sight at any time. Once count was completed pills were immediately returned to the patient in their original bottle.  Medication: Tramadol (Ultram) Pill Count: 26 of 120 pills remain Bottle Appearance: Standard pharmacy container. Clearly labeled. Filled Date: 6211 / 15/ 2017 Medication last intake: 01/15/2016

## 2016-01-15 NOTE — Progress Notes (Signed)
Patient's Name: Dylan [name]  MRN: [id number]  Referring Provider: Margo Common, [place]  DOB: [birth year]  PCP: Margo Common, PA  DOS: 01/15/2016  Note by: Kathlen Brunswick. Dossie Arbour, MD  Service setting: Ambulatory outpatient  Specialty: Interventional Pain Management  Location: [facility] (AMB) Pain Management Facility    Patient type: Established   Primary Reason(s) for Visit: Encounter for prescription drug management (Level of risk: moderate) CC: Testicle Pain (left)  HPI  [name] is a 65 y.o. year old, male patient, who comes today for a medication management evaluation. 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 His primarily concern today is the Testicle Pain (left)  Pain Assessment: Self-Reported Pain Score: 1 /10             Reported level is compatible with observation.       Pain Type: Chronic pain Pain Location: Other (Comment) (left testicle) Pain Orientation: Left Pain Descriptors / Indicators: Aching, Sharp (sharp at times) Pain Frequency: Intermittent  Mr. Torosyan was last seen on 07/21/2015 for medication management. During today's appointment we reviewed [name]'s chronic pain status, as well as his outpatient medication regimen.  The patient  has no drug history on file. His body mass index is 27.89 kg/m.  Further details on both, my assessment(s), as well as the proposed treatment plan, please see below.  Controlled Substance Pharmacotherapy Assessment REMS (Risk Evaluation and Mitigation Strategy)  Analgesic: Tramadol 50 mg every 6 hours (200 mg/day) MME/day: 20  mg/day Evon Slack, RN  01/15/2016  2:09 PM  Sign at close encounter Nursing Pain Medication Assessment:  Safety precautions to be maintained throughout the outpatient stay will include: orient to surroundings, keep bed in low position, maintain call bell within reach at all times, provide assistance with transfer out of bed and ambulation.  Medication Inspection Compliance: Pill count conducted under aseptic conditions, in front of the patient. Neither the pills nor the bottle was removed from the patient's sight at any time. Once count was completed pills were immediately returned to the patient in their original bottle.  Medication: Tramadol (Ultram) Pill Count: 26 of 120 pills remain Bottle Appearance: Standard pharmacy container. Clearly labeled. Filled Date: 42 / 15/ 2017 Medication last intake: 01/15/2016   Pharmacokinetics: Liberation and absorption (onset of action): WNL Distribution (time to peak effect): WNL Metabolism and excretion (duration of action): WNL         Pharmacodynamics: Desired effects: Analgesia: [name] reports >50% benefit. Functional ability: Patient reports that medication allows him to accomplish basic ADLs Clinically meaningful improvement in function (CMIF): Sustained CMIF goals met Perceived effectiveness: Described as relatively effective, allowing for increase in activities of daily living (ADL) Undesirable effects: Side-effects or Adverse reactions: None reported Monitoring: [place] PMP: Online review of the past 72-monthperiod conducted. Compliant with practice rules and regulations List of all UDS test(s) done:  Lab Results  Component Value Date   TOXASSSELUR FINAL 01/20/2015   Last UDS on record: ToxAssure Select 13  Date Value Ref Range Status  01/20/2015 FINAL  Final    Comment:    ==================================================================== TOXASSURE SELECT 13  (MW) ==================================================================== Test  Result       Flag       Units Drug Present and Declared for Prescription Verification   Tramadol                       PRESENT      EXPECTED   O-Desmethyltramadol            PRESENT      EXPECTED   N-Desmethyltramadol            PRESENT      EXPECTED    Source of tramadol is a prescription medication.    O-desmethyltramadol and N-desmethyltramadol are expected    metabolites of tramadol. ==================================================================== Test                      Result    Flag   Units      Ref Range   Creatinine              76               mg/dL      >=20 ==================================================================== Declared Medications:  The flagging and interpretation on this report are based on the  following declared medications.  Unexpected results may arise from  inaccuracies in the declared medications.  **Note: The testing scope of this panel includes these medications:  Tramadol  **Note: The testing scope of this panel does not include following  reported medications:  Gabapentin ==================================================================== For clinical consultation, please call [phone]. ====================================================================    UDS interpretation: Compliant          Medication Assessment Form: Reviewed. Patient indicates being compliant with therapy Treatment compliance: Compliant Risk Assessment Profile: Aberrant behavior: See prior evaluations. None observed or detected today Comorbid factors increasing risk of overdose: See prior notes. No additional risks detected today Risk of substance use disorder (SUD): Low Opioid Risk Tool (ORT) Total Score: 0  Interpretation Table:  Score <3 = Low Risk for SUD  Score between 4-7 = Moderate Risk for SUD  Score >8 = High Risk for Opioid Abuse    Risk Mitigation Strategies:  Patient Counseling: Covered Patient-Prescriber Agreement (PPA): Present and active  Notification to other healthcare providers: Done  Pharmacologic Plan: No change in therapy, at this time  Laboratory Chemistry  Inflammation Markers Lab Results  Component Value Date   ESRSEDRATE 9 01/23/2015   Renal Function Lab Results  Component Value Date   BUN 17 01/23/2015   CREATININE 0.78 01/23/2015   GFRAA >60 01/23/2015   GFRNONAA >60 01/23/2015   Hepatic Function Lab Results  Component Value Date   AST 21 01/23/2015   ALT 16 (L) 01/23/2015   ALBUMIN 4.4 01/23/2015   Electrolytes Lab Results  Component Value Date   NA 134 (L) 01/23/2015   K 4.0 01/23/2015   CL 105 01/23/2015   CALCIUM 8.5 (L) 01/23/2015   MG 2.2 01/23/2015   Pain Modulating Vitamins Lab Results  Component Value Date   VD25OH 22.3 (L) 01/23/2015   VITAMINB12 326 01/23/2015   Coagulation Parameters Lab Results  Component Value Date   PLT 267 03/01/2009   Cardiovascular Lab Results  Component Value Date   HGB 12.1 (L) 03/01/2009   HCT 35.5 (L) 03/01/2009   Note: Lab results reviewed.  Recent Diagnostic Imaging Review  No results found. Note: Imaging results reviewed.          Meds  The patient has a current medication  list which includes the following prescription(s): gabapentin, latanoprost, and tramadol.  Current Outpatient Prescriptions on File Prior to Visit  Medication Sig  . latanoprost (XALATAN) 0.005 % ophthalmic solution Place into both eyes daily.    No current facility-administered medications on file prior to visit.    ROS  Constitutional: Denies any fever or chills Gastrointestinal: No reported hemesis, hematochezia, vomiting, or acute GI distress Musculoskeletal: Denies any acute onset joint swelling, redness, loss of ROM, or weakness Neurological: No reported episodes of acute onset apraxia, aphasia, dysarthria, agnosia, amnesia, paralysis,  loss of coordination, or loss of consciousness  Allergies  Mr. Creswell has No Known Allergies.  PFSH  Drug: Mr. Evilsizer  has no drug history on file. Alcohol:  reports that he drinks alcohol. Tobacco:  reports that he has never smoked. He does not have any smokeless tobacco history on file. Medical:  has a past medical history of Genital disorder, male (08/16/2005); Lumbago (01/17/2009); Pain syndrome, chronic; and Sciatica. Family: family history is not on file.  Past Surgical History:  Procedure Laterality Date  . BACK SURGERY  02/2009   ruptured disk   Constitutional Exam  General appearance: Well nourished, well developed, and well hydrated. In no apparent acute distress Vitals:   01/15/16 1357  BP: 137/66  Pulse: 74  Resp: 16  Temp: 98 F (36.7 C)  TempSrc: Oral  SpO2: 99%  Weight: 200 lb (90.7 kg)  Height: _0  (1.803 m)   BMI Assessment: Estimated body mass index is 27.89 kg/m as calculated from the following:   Height as of this encounter: _1  (1.803 m).   Weight as of this encounter: 200 lb (90.7 kg).  BMI interpretation table: BMI level Category Range association with higher incidence of chronic pain  <18 kg/m2 Underweight   18.5-24.9 kg/m2 Ideal body weight   25-29.9 kg/m2 Overweight Increased incidence by 20%  30-34.9 kg/m2 Obese (Class I) Increased incidence by 68%  35-39.9 kg/m2 Severe obesity (Class II) Increased incidence by 136%  >40 kg/m2 Extreme obesity (Class III) Increased incidence by 254%   BMI Readings from Last 4 Encounters:  01/15/16 27.89 kg/m  07/21/15 27.89 kg/m  01/20/15 28.70 kg/m  08/26/14 27.78 kg/m   Wt Readings from Last 4 Encounters:  01/15/16 200 lb (90.7 kg)  07/21/15 200 lb (90.7 kg)  01/20/15 200 lb (90.7 kg)  08/26/14 199 lb 3.2 oz (90.4 kg)  Psych/Mental status: Alert, oriented x 3 (person, place, & time) Eyes: PERLA Respiratory: No evidence of acute respiratory distress  Cervical Spine Exam  Inspection: No  masses, redness, or swelling Alignment: Symmetrical Functional ROM: Unrestricted ROM Stability: No instability detected Muscle strength & Tone: Functionally intact Sensory: Unimpaired Palpation: Non-contributory  Upper Extremity (UE) Exam    Side: Right upper extremity  Side: Left upper extremity  Inspection: No masses, redness, swelling, or asymmetry  Inspection: No masses, redness, swelling, or asymmetry  Functional ROM: Unrestricted ROM          Functional ROM: Unrestricted ROM          Muscle strength & Tone: Functionally intact  Muscle strength & Tone: Functionally intact  Sensory: Unimpaired  Sensory: Unimpaired  Palpation: Non-contributory  Palpation: Non-contributory   Thoracic Spine Exam  Inspection: No masses, redness, or swelling Alignment: Symmetrical Functional ROM: Unrestricted ROM Stability: No instability detected Sensory: Unimpaired Muscle strength & Tone: Functionally intact Palpation: Non-contributory  Lumbar Spine Exam  Inspection: No masses, redness, or swelling Alignment: Symmetrical Functional ROM: Unrestricted ROM Stability:  No instability detected Muscle strength & Tone: Functionally intact Sensory: Unimpaired Palpation: Non-contributory Provocative Tests: Lumbar Hyperextension and rotation test: evaluation deferred today       Patrick's Maneuver: evaluation deferred today              Gait & Posture Assessment  Ambulation: Unassisted Gait: Relatively normal for age and body habitus Posture: WNL   Lower Extremity Exam    Side: Right lower extremity  Side: Left lower extremity  Inspection: No masses, redness, swelling, or asymmetry  Inspection: No masses, redness, swelling, or asymmetry  Functional ROM: Unrestricted ROM          Functional ROM: Unrestricted ROM          Muscle strength & Tone: Functionally intact  Muscle strength & Tone: Functionally intact  Sensory: Unimpaired  Sensory: Unimpaired  Palpation: Non-contributory  Palpation:  Non-contributory   Assessment  Primary Diagnosis & Pertinent Problem List: The primary encounter diagnosis was Chronic pain syndrome. Diagnoses of Long term current use of opiate analgesic, Opiate use, Chronic groin pain (Left), Chronic low back pain, Neuropathic pain, Neurogenic pain, and Visceral pain were also pertinent to this visit.  Status Diagnosis   Stable  Stable  Stable 1. Chronic pain syndrome   2. Long term current use of opiate analgesic   3. Opiate use   4. Chronic groin pain (Left)   5. Chronic low back pain   6. Neuropathic pain   7. Neurogenic pain   8. Visceral pain      Plan of Care  Pharmacotherapy (Medications Ordered): Meds ordered this encounter  Medications  . traMADol (ULTRAM) 50 MG tablet    Sig: Take 1 tablet (50 mg total) by mouth every 6 (six) hours as needed for severe pain.    Dispense:  120 tablet    Refill:  5    Do not place this medication, or any other prescription from our practice, on "Automatic Refill". Patient may have prescription filled one day early if pharmacy is closed on scheduled refill date.  . gabapentin (NEURONTIN) 300 MG capsule    Sig: Take 1 capsule (300 mg total) by mouth every 8 (eight) hours.    Dispense:  90 capsule    Refill:  5    Do not place this medication, or any other prescription from our practice, on "Automatic Refill". Patient may have prescription filled one day early if pharmacy is closed on scheduled refill date.   New Prescriptions   GABAPENTIN (NEURONTIN) 300 MG CAPSULE    Take 1 capsule (300 mg total) by mouth every 8 (eight) hours.   Medications administered today: Mr. Minchey had no medications administered during this visit. Lab-work, procedure(s), and/or referral(s): Orders Placed This Encounter  Procedures  . ToxASSURE Select 13 (MW), Urine  . Comprehensive metabolic panel  . C-reactive protein  . Magnesium  . Sedimentation rate  . Vitamin B12  . 25-Hydroxyvitamin D Lcms D2+D3   Imaging  and/or referral(s): None  Interventional therapies: Planned, scheduled, and/or pending:   None at this time.    Considering:   None at this time.    Palliative PRN treatment(s):   Not at this time.   Provider-requested follow-up: Return in about 6 months (around 07/15/2016) for Med-Mgmt.  Future Appointments Date Time Provider [place]  07/01/2016 7:45 AM Milinda Pointer, MD [facility] None   Primary Care Physician: Margo Common, PA Location: [facility] Note by: Kathlen Brunswick. Dossie Arbour, M.D, DABA, DABAPM, DABPM,  DABIPP, FIPP Date: 01/15/16; Time: 2:36 PM  Pain Score Disclaimer: We use the NRS-11 scale. This is a self-reported, subjective measurement of pain severity with only modest accuracy. It is used primarily to identify changes within a particular patient. It must be understood that outpatient pain scales are significantly less accurate that those used for research, where they can be applied under ideal controlled circumstances with minimal exposure to variables. In reality, the score is likely to be a combination of pain intensity and pain affect, where pain affect describes the degree of emotional arousal or changes in action readiness caused by the sensory experience of pain. Factors such as social and work situation, setting, emotional state, anxiety levels, expectation, and prior pain experience may influence pain perception and show large inter-individual differences that may also be affected by time variables.  Patient instructions provided during this appointment: Patient Instructions  Pain Management Discharge Instructions  General Discharge Instructions :  If you need to reach your doctor call: Monday-Friday 8:00 am - 4:00 pm at [phone] or toll free [phone].  After clinic hours [phone] to have operator reach doctor.  Bring all of your medication bottles to all your appointments in the pain clinic.  To cancel  or reschedule your appointment with Pain Management please remember to call 24 hours in advance to avoid a fee.  Refer to the educational materials which you have been given on: General Risks, I had my Procedure. Discharge Instructions, Post Sedation.  Post Procedure Instructions:  The drugs you were given will stay in your system until tomorrow, so for the next 24 hours you should not drive, make any legal decisions or drink any alcoholic beverages.  You may eat anything you prefer, but it is better to start with liquids then soups and crackers, and gradually work up to solid foods.  Please notify your doctor immediately if you have any unusual bleeding, trouble breathing or pain that is not related to your normal pain.  Depending on the type of procedure that was done, some parts of your body may feel week and/or numb.  This usually clears up by tonight or the next day.  Walk with the use of an assistive device or accompanied by an adult for the 24 hours.  You may use ice on the affected area for the first 24 hours.  Put ice in a Ziploc bag and cover with a towel and place against area 15 minutes on 15 minutes off.  You may switch to heat after 24 hours.

## 2016-01-19 ENCOUNTER — Other Ambulatory Visit
Admission: RE | Admit: 2016-01-19 | Discharge: 2016-01-19 | Disposition: A | Discharge status: Home / Self Care | Payer: Managed Care, Other (non HMO) | Source: Ambulatory Visit | Attending: Pain Medicine | Admitting: Pain Medicine

## 2016-01-19 DIAGNOSIS — G894 Chronic pain syndrome: Secondary | ICD-10-CM | POA: Diagnosis present

## 2016-01-19 LAB — MAGNESIUM: MAGNESIUM: 2.3 mg/dL (ref 1.7–2.4)

## 2016-01-19 LAB — COMPREHENSIVE METABOLIC PANEL
ALBUMIN: 4.5 g/dL (ref 3.5–5.0)
ALT: 19 U/L (ref 17–63)
ANION GAP: 7 (ref 5–15)
AST: 26 U/L (ref 15–41)
Alkaline Phosphatase: 62 U/L (ref 38–126)
BUN: 15 mg/dL (ref 6–20)
CALCIUM: 8.9 mg/dL (ref 8.9–10.3)
CO2: 26 mmol/L (ref 22–32)
Chloride: 103 mmol/L (ref 101–111)
Creatinine, Ser: 0.85 mg/dL (ref 0.61–1.24)
GFR calc non Af Amer: >60 mL/min (ref >60)
GLUCOSE: 100 mg/dL — AB (ref 65–99)
POTASSIUM: 4.2 mmol/L (ref 3.5–5.1)
SODIUM: 136 mmol/L (ref 135–145)
Total Bilirubin: 0.5 mg/dL (ref 0.3–1.2)
Total Protein: 7.5 g/dL (ref 6.5–8.1)

## 2016-01-19 LAB — C-REACTIVE PROTEIN: CRP: 1.7 mg/dL — ABNORMAL HIGH (ref <1.0)

## 2016-01-19 LAB — SEDIMENTATION RATE: SED RATE: 13 mm/h (ref 0–20)

## 2016-01-19 LAB — VITAMIN B12: VITAMIN B 12: 339 pg/mL (ref 180–914)

## 2016-01-22 LAB — 25-HYDROXYVITAMIN D LCMS D2+D3
25-HYDROXY, VITAMIN D-2: 1.2 ng/mL
25-HYDROXY, VITAMIN D-3: 33 ng/mL

## 2016-01-22 LAB — 25-HYDROXY VITAMIN D LCMS D2+D3: 25-Hydroxy, Vitamin D: 34 ng/mL

## 2016-01-24 LAB — TOXASSURE SELECT 13 (MW), URINE

## 2016-02-24 ENCOUNTER — Ambulatory Visit (INDEPENDENT_AMBULATORY_CARE_PROVIDER_SITE_OTHER): Payer: Managed Care, Other (non HMO) | Admitting: Family Medicine

## 2016-02-24 ENCOUNTER — Encounter: Payer: Self-pay | Admitting: Family Medicine

## 2016-02-24 VITALS — BP 144/84 | HR 72 | Temp 98.4°F | Resp 16 | Wt 203.4 lb

## 2016-02-24 DIAGNOSIS — G8929 Other chronic pain: Secondary | ICD-10-CM

## 2016-02-24 DIAGNOSIS — N50819 Testicular pain, unspecified: Secondary | ICD-10-CM | POA: Diagnosis not present

## 2016-02-24 DIAGNOSIS — M545 Low back pain, unspecified: Secondary | ICD-10-CM

## 2016-02-24 DIAGNOSIS — R030 Elevated blood-pressure reading, without diagnosis of hypertension: Secondary | ICD-10-CM | POA: Diagnosis not present

## 2016-02-24 NOTE — Patient Instructions (Signed)
Hypertension Hypertension, commonly called high blood pressure, is when the force of blood pumping through your arteries is too strong. Your arteries are the blood vessels that carry blood from your heart throughout your body. A blood pressure reading consists of a higher number over a lower number, such as 110/72. The higher number (systolic) is the pressure inside your arteries when your heart pumps. The lower number (diastolic) is the pressure inside your arteries when your heart relaxes. Ideally you want your blood pressure below 120/80. Hypertension forces your heart to work harder to pump blood. Your arteries may become narrow or stiff. Having untreated or uncontrolled hypertension can cause heart attack, stroke, kidney disease, and other problems. What increases the risk? Some risk factors for high blood pressure are controllable. Others are not. Risk factors you cannot control include:  Race. You may be at higher risk if you are African American.  Age. Risk increases with age.  Gender. Men are at higher risk than women before age 45 years. After age 65, women are at higher risk than men. Risk factors you can control include:  Not getting enough exercise or physical activity.  Being overweight.  Getting too much fat, sugar, calories, or salt in your diet.  Drinking too much alcohol. What are the signs or symptoms? Hypertension does not usually cause signs or symptoms. Extremely high blood pressure (hypertensive crisis) may cause headache, anxiety, shortness of breath, and nosebleed. How is this diagnosed? To check if you have hypertension, your health care provider will measure your blood pressure while you are seated, with your arm held at the level of your heart. It should be measured at least twice using the same arm. Certain conditions can cause a difference in blood pressure between your right and left arms. A blood pressure reading that is higher than normal on one occasion does  not mean that you need treatment. If it is not clear whether you have high blood pressure, you may be asked to return on a different day to have your blood pressure checked again. Or, you may be asked to monitor your blood pressure at home for 1 or more weeks. How is this treated? Treating high blood pressure includes making lifestyle changes and possibly taking medicine. Living a healthy lifestyle can help lower high blood pressure. You may need to change some of your habits. Lifestyle changes may include:  Following the DASH diet. This diet is high in fruits, vegetables, and whole grains. It is low in salt, red meat, and added sugars.  Keep your sodium intake below 2,300 mg per day.  Getting at least 30-45 minutes of aerobic exercise at least 4 times per week.  Losing weight if necessary.  Not smoking.  Limiting alcoholic beverages.  Learning ways to reduce stress. Your health care provider may prescribe medicine if lifestyle changes are not enough to get your blood pressure under control, and if one of the following is true:  You are 18-59 years of age and your systolic blood pressure is above 140.  You are 60 years of age or older, and your systolic blood pressure is above 150.  Your diastolic blood pressure is above 90.  You have diabetes, and your systolic blood pressure is over 140 or your diastolic blood pressure is over 90.  You have kidney disease and your blood pressure is above 140/90.  You have heart disease and your blood pressure is above 140/90. Your personal target blood pressure may vary depending on your medical   conditions, your age, and other factors. Follow these instructions at home:  Have your blood pressure rechecked as directed by your health care provider.  Take medicines only as directed by your health care provider. Follow the directions carefully. Blood pressure medicines must be taken as prescribed. The medicine does not work as well when you skip  doses. Skipping doses also puts you at risk for problems.  Do not smoke.  Monitor your blood pressure at home as directed by your health care provider. Contact a health care provider if:  You think you are having a reaction to medicines taken.  You have recurrent headaches or feel dizzy.  You have swelling in your ankles.  You have trouble with your vision. Get help right away if:  You develop a severe headache or confusion.  You have unusual weakness, numbness, or feel faint.  You have severe chest or abdominal pain.  You vomit repeatedly.  You have trouble breathing. This information is not intended to replace advice given to you by your health care provider. Make sure you discuss any questions you have with your health care provider. Document Released: 01/18/2005 Document Revised: 06/26/2015 Document Reviewed: 11/10/2012 Elsevier Interactive Patient Education  2017 [employer]  

## 2016-02-24 NOTE — Progress Notes (Signed)
Patient: Dylan PaneRonald R [name] Male    DOB: [birth year]   66 y.o.   MRN: [id number] Visit Date: 02/24/2016  Today's Provider: Dortha Kernennis Jew[place], [place]   Chief Complaint  Patient presents with  . Hypertension   Subjective:    Hypertension  This is a new problem. Past treatments include nothing.  Patient reports going to [facility] on Monday for a appointment. His blood pressure was elevated at 160's/? Patient was unsure of diastolic reading. Patient denies headache, dizziness and blurred vision. He exercises lightly. Blood pressure is not being checked at home.   Patient Active Problem List   Diagnosis Date Noted  . 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 Testicular pain (Left) 01/20/2015  . Long term current use of opiate analgesic 01/20/2015  . Long term prescription opiate use 01/20/2015  . Opiate use 01/20/2015  . Encounter for long-term current use of medication 01/20/2015  . Encounter for chronic pain management 01/20/2015  . Neuropathic pain 01/20/2015  . Neurogenic pain 01/20/2015  . Visceral pain 01/20/2015  . Chronic groin pain (Left) 01/20/2015  . Chronic pain syndrome 08/26/2014  . Neuralgia neuritis, sciatic nerve 08/26/2014  . Chronic low back pain 01/17/2009  . Disorder of male genital organs 08/16/2005   Past Medical History:  Diagnosis Date  . 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 Lumbago 01/17/2009  . Pain syndrome, chronic   . Sciatica    Past Surgical History:  Procedure Laterality Date  . BACK SURGERY  02/2009   ruptured disk   No family history on file.   No Known Allergies   Previous Medications   GABAPENTIN (NEURONTIN) 300 MG CAPSULE    Take 1 capsule (300 mg total) by mouth every 8 (eight) hours.   LATANOPROST (XALATAN) 0.005 % OPHTHALMIC SOLUTION    Place into both eyes daily.    TRAMADOL (ULTRAM) 50 MG TABLET    Take 1 tablet (50 mg total) by mouth every 6 (six) hours as needed for severe pain.    Review of Systems  Constitutional: Negative.     Respiratory: Negative.   Cardiovascular: Negative.     Social History  Substance Use Topics  . Smoking status: Never Smoker  . Smokeless tobacco: Never Used  . Alcohol use Yes     Comment: rarely alcohol   Objective:   BP (!) 144/84 (BP Location: Right Arm, Patient Position: Sitting, Cuff Size: Normal)   Pulse 72   Temp 98.4 F (36.9 C) (Oral)   Resp 16   Wt 203 lb 6.4 oz (92.3 kg)   SpO2 96%   BMI 28.37 kg/m   Physical Exam  Constitutional: He is oriented to person, place, and time. He appears well-developed and well-nourished. No distress.  HENT:  Head: Normocephalic and atraumatic.  Right Ear: Hearing normal.  Left Ear: Hearing normal.  Nose: Nose normal.  Eyes: Conjunctivae and lids are normal. Right eye exhibits no discharge. Left eye exhibits no discharge. No scleral icterus.  Neck: Neck supple. No thyromegaly present.  Cardiovascular: Normal rate, regular rhythm and intact distal pulses.   Pulmonary/Chest: Effort normal and breath sounds normal. No respiratory distress.  Abdominal: Soft. Bowel sounds are normal.  Musculoskeletal: Normal range of motion.  Lymphadenopathy:    He has no cervical adenopathy.  Neurological: He is alert and oriented to person, place, and time.  Skin: Skin is intact. No lesion and no rash noted.  Psychiatric: He has a normal mood and affect. His speech is normal and behavior is  normal. Thought content normal.      Assessment & Plan:     1. Elevated BP without diagnosis of hypertension Was having ears washed out at [facility] and they found his BP up to 160's systolic. Recommend salt restriction, limit caffeine and ETOH. Exercise regularly and recheck BP in 6 weeks. BP 144/84 today. Remembers chronic pain was a little more intense yesterday at the ENT office.  2. Orchialgia (Left) Chronic pain and controlled by Neurontin with Tramadol. No dysuria. Essentially unchanged.  3. Chronic low back pain Neurogenic pain controlled by  Tramadol and Neurontin. Followed by Dr. Ambrose Finland (pain management clinic).

## 2016-04-06 ENCOUNTER — Ambulatory Visit (INDEPENDENT_AMBULATORY_CARE_PROVIDER_SITE_OTHER): Payer: Managed Care, Other (non HMO) | Admitting: Family Medicine

## 2016-04-06 ENCOUNTER — Encounter: Payer: Self-pay | Admitting: Family Medicine

## 2016-04-06 VITALS — BP 138/88 | HR 67 | Temp 98.2°F | Wt 197.8 lb

## 2016-04-06 DIAGNOSIS — R030 Elevated blood-pressure reading, without diagnosis of hypertension: Secondary | ICD-10-CM | POA: Diagnosis not present

## 2016-04-06 NOTE — Progress Notes (Addendum)
Patient: Dylan [name] Male    DOB: 09-14-1950   66 y.o.   MRN: [id number] Visit Date: 04/06/2016  Today's Provider: Dortha Kernennis Jemmie Rhinehart, PA   Chief Complaint  Patient presents with  . Hypertension  . Follow-up   Subjective:    HPI Elevated Blood Pressure: Patient is here for a 6 week follow up. Last OV on 02/24/2016 patient's BP was 144/84. Patient was advised to restrict salt in diet, exercise and limit caffeine and alcohol intake. Patient reports fairly good compliance with recommendations. BP today is 138/88. Patient has been going to the pharmacy to check blood pressure once weekly. He reports reading are between 125-130's systolic and unsure of diastolic readings.  Past Medical History:  Diagnosis Date  . 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 Lumbago 01/17/2009  . Pain syndrome, chronic   . Sciatica    Patient Active Problem List   Diagnosis Date Noted  . 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 Testicular pain (Left) 01/20/2015  . Long term current use of opiate analgesic 01/20/2015  . Long term prescription opiate use 01/20/2015  . Opiate use 01/20/2015  . Encounter for long-term current use of medication 01/20/2015  . Encounter for chronic pain management 01/20/2015  . Neuropathic pain 01/20/2015  . Neurogenic pain 01/20/2015  . Visceral pain 01/20/2015  . Chronic groin pain (Left) 01/20/2015  . Chronic pain syndrome 08/26/2014  . Neuralgia neuritis, sciatic nerve 08/26/2014  . Chronic low back pain 01/17/2009  . Disorder of male genital organs 08/16/2005   Past Surgical History:  Procedure Laterality Date  . BACK SURGERY  02/2009   ruptured disk   No Known Allergies     Previous Medications   GABAPENTIN (NEURONTIN) 300 MG CAPSULE    Take 1 capsule (300 mg total) by mouth every 8 (eight) hours.   LATANOPROST (XALATAN) 0.005 % OPHTHALMIC SOLUTION    Place into both eyes daily.    TRAMADOL (ULTRAM) 50 MG TABLET    Take 1 tablet (50 mg total) by mouth every 6 (six)  hours as needed for severe pain.    Review of Systems  Constitutional: Negative.   Respiratory: Negative.   Cardiovascular: Negative.     Social History  Substance Use Topics  . Smoking status: Never Smoker  . Smokeless tobacco: Never Used  . Alcohol use Yes     Comment: rarely alcohol   Objective:   BP 138/88 (BP Location: Right Arm, Patient Position: Sitting, Cuff Size: Normal)   Pulse 67   Temp 98.2 F (36.8 C) (Oral)   Wt 197 lb 12.8 oz (89.7 kg)   SpO2 99%   BMI 27.59 kg/m   Physical Exam  Constitutional: He is oriented to person, place, and time. He appears well-developed and well-nourished. No distress.  HENT:  Head: Normocephalic and atraumatic.  Right Ear: Hearing normal.  Left Ear: Hearing normal.  Nose: Nose normal.  Eyes: Conjunctivae and lids are normal. Right eye exhibits no discharge. Left eye exhibits no discharge. No scleral icterus.  Pulmonary/Chest: Effort normal. No respiratory distress.  Musculoskeletal: Normal range of motion.  Neurological: He is alert and oriented to person, place, and time.  Skin: Skin is intact. No lesion and no rash noted.  Psychiatric: He has a normal mood and affect. His speech is normal and behavior is normal. Thought content normal.   [facility] 04/06/2016 01/15/2016 07/21/2015 01/20/2015  Decreased Interest 0 0 0 0  Down, Depressed, Hopeless 0 0 0 0  PHQ -  2 Score 0 0 0 0  Altered sleeping 1 - - -  Tired, decreased energy 0 - - -  Change in appetite 0 - - -  Feeling bad or failure about yourself  0 - - -  Trouble concentrating 0 - - -  Moving slowly or fidgety/restless 0 - - -  Suicidal thoughts 0 - - -  PHQ-9 Score 1 - - -       Assessment & Plan:     1. Elevated BP without diagnosis of hypertension Improved BP readings. Away from this office BP in the 120-130/70-80 range. No chest pains, palpitations, edema or dyspnea. Continue with regular exercise and restricting sodium, caffeine and ETOH.  Recommend scheduling medicare AWE.

## 2016-05-03 NOTE — Progress Notes (Signed)
Normal CRP (C-reactive protein) Level(s): Less than <1.0 mg/dL. Elevated level(s): Levels above 1.0 mg/dL. Clinical significance: C-reactive protein (CRP) is produced by the liver. The level of CRP rises when there is inflammation throughout the body. CRP goes up in response to inflammation. High levels suggests the presence of chronic inflammation but do not identify its location or cause. Drops of previously elevated levels suggest that the inflammation or infection is subsiding and/or responding to treatment. Possible causes: High levels have been observed in obese patients, individuals with bacterial infections, chronic inflammation, or flare-ups of inflammatory conditions. Recommendations: - Consider the use of anti-inflammatory diet and medications. ___________________________________________________________________________________  - Normal fasting (NPO x 8 hours) glucose levels are between 65-99 mg/dl, with 2 hour fasting, levels are usually less than 140 mg/dl. Any random blood glucose level greater than 200 mg/dl is considered to be Diabetes. ___________________________________________________________________________________

## 2016-06-30 NOTE — Progress Notes (Signed)
Patient's Name: Dylan [name]  MRN: [id number]  Referring Provider: Margo Common, PA  DOB: 06/10/1950  PCP: Margo Common, PA  DOS: 07/01/2016  Note by: Kathlen Brunswick. Dossie Arbour, MD  Service setting: Ambulatory outpatient  Specialty: Interventional Pain Management  Location: [facility] (AMB) Pain Management Facility    Patient type: Established   Primary Reason(s) for Visit: Encounter for prescription drug management (Level of risk: moderate) CC: Testicle Pain (left)  HPI  Dylan [name] is a 66 y.o. year old, male patient, who comes today for a medication management evaluation. 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 His primarily concern today is the Testicle Pain (left)  Pain Assessment: Self-Reported Pain Score: 1 /10             Reported level is compatible with observation.       Pain Descriptors / Indicators: Evonnie Pat, Dull Pain Frequency: Intermittent  Dylan [name] was last scheduled for an appointment on 01/15/2016 for medication management. During today's appointment we reviewed Dylan [name]'s chronic pain status, as well as his outpatient medication regimen.  The patient  has no drug history on file. His body mass index is 27.12 kg/m.  Further details on both, my assessment(s), as well as the proposed treatment plan, please see below.  Controlled Substance Pharmacotherapy Assessment REMS (Risk Evaluation and Mitigation Strategy)  Analgesic:Tramadol 50 mg every 6 hours (200 mg/day) MME/day:20 mg/day Dylan Rochester, RN  07/01/2016  9:28 AM  Signed Nursing Pain Medication  Assessment:  Safety precautions to be maintained throughout the outpatient stay will include: orient to surroundings, keep bed in low position, maintain call bell within reach at all times, provide assistance with transfer out of bed and ambulation.  Medication Inspection Compliance: Pill count conducted under aseptic conditions, in front of the patient. Neither the pills nor the bottle was removed from the patient's sight at any time. Once count was completed pills were immediately returned to the patient in their original bottle.  Medication: Tramadol (Ultram) Pill/Patch Count: 49 of 120 pills remain Pill/Patch Appearance: Markings consistent with prescribed medication Bottle Appearance: Standard pharmacy container. Clearly labeled. Filled Date: 05 / 18 / 2018 Last Medication intake:  Today   Pharmacokinetics: Liberation and absorption (onset of action): WNL Distribution (time to peak effect): WNL Metabolism and excretion (duration of action): WNL         Pharmacodynamics: Desired effects: Analgesia: Dylan [name] reports >50% benefit. Functional ability: Patient reports that medication allows him to accomplish basic ADLs Clinically meaningful improvement in function (CMIF): Sustained CMIF goals met Perceived effectiveness: Described as relatively effective, allowing for increase in activities of daily living (ADL) Undesirable effects: Side-effects or Adverse reactions: None reported Monitoring: [place] PMP: Online review of the past 78-monthperiod conducted. Compliant with practice rules and regulations List of all UDS test(s) done:  Lab Results  Component Value Date   TOXASSSELUR FINAL 01/15/2016   TOXASSSELUR FINAL 01/20/2015   Last UDS on record: ToxAssure Select 13  Date Value Ref Range Status  01/15/2016 FINAL  Final    Comment:    ==================================================================== TOXASSURE SELECT 13  (MW) ==================================================================== Test  Result       Flag       Units Drug Present and Declared for Prescription Verification   Tramadol                       PRESENT      EXPECTED   O-Desmethyltramadol            PRESENT      EXPECTED   N-Desmethyltramadol            PRESENT      EXPECTED    Source of tramadol is a prescription medication.    O-desmethyltramadol and N-desmethyltramadol are expected    metabolites of tramadol. ==================================================================== Test                      Result    Flag   Units      Ref Range   Creatinine              146              mg/dL      >=20 ==================================================================== Declared Medications:  The flagging and interpretation on this report are based on the  following declared medications.  Unexpected results may arise from  inaccuracies in the declared medications.  **Note: The testing scope of this panel includes these medications:  Tramadol (Ultram)  **Note: The testing scope of this panel does not include following  reported medications:  Gabapentin (Neurontin)  Latanoprost (Xalatan) ==================================================================== For clinical consultation, please call [phone]. ====================================================================    UDS interpretation: Compliant          Medication Assessment Form: Reviewed. Patient indicates being compliant with therapy Treatment compliance: Compliant Risk Assessment Profile: Aberrant behavior: See prior evaluations. None observed or detected today Comorbid factors increasing risk of overdose: See prior notes. No additional risks detected today Risk of substance use disorder (SUD): Low Opioid Risk Tool (ORT) Total Score:    Interpretation Table:  Score <3 = Low Risk for SUD  Score between 4-7 = Moderate Risk for SUD   Score >8 = High Risk for Opioid Abuse   Risk Mitigation Strategies:  Patient Counseling: Covered Patient-Prescriber Agreement (PPA): Present and active  Notification to other healthcare providers: Done  Pharmacologic Plan: No change in therapy, at this time  Laboratory Chemistry  Inflammation Markers Lab Results  Component Value Date   CRP 1.7 (H) 01/19/2016   ESRSEDRATE 13 01/19/2016   (CRP: Acute Phase) (ESR: Chronic Phase) Renal Function Markers Lab Results  Component Value Date   BUN 15 01/19/2016   CREATININE 0.85 01/19/2016   GFRAA >60 01/19/2016   GFRNONAA >60 01/19/2016   Hepatic Function Markers Lab Results  Component Value Date   AST 26 01/19/2016   ALT 19 01/19/2016   ALBUMIN 4.5 01/19/2016   ALKPHOS 62 01/19/2016   HCVAB NEGATIVE 02/28/2009   Electrolytes Lab Results  Component Value Date   NA 136 01/19/2016   K 4.2 01/19/2016   CL 103 01/19/2016   CALCIUM 8.9 01/19/2016   MG 2.3 01/19/2016   Neuropathy Markers Lab Results  Component Value Date   VITAMINB12 339 01/19/2016   Bone Pathology Markers Lab Results  Component Value Date   ALKPHOS 62 01/19/2016   VD25OH 22.3 (L) 01/23/2015   25OHVITD1 34 01/19/2016   25OHVITD2 1.2 01/19/2016   25OHVITD3 33 01/19/2016   CALCIUM 8.9 01/19/2016   Coagulation Parameters Lab Results  Component Value Date  PLT 267 03/01/2009   Cardiovascular Markers Lab Results  Component Value Date   HGB 12.1 (L) 03/01/2009   HCT 35.5 (L) 03/01/2009   Note: Lab results reviewed.  Recent Diagnostic Imaging Review  No results found. Note: Imaging results reviewed.          Meds  The patient has a current medication list which includes the following prescription(s): gabapentin, latanoprost, and tramadol.  Current Outpatient Prescriptions on File Prior to Visit  Medication Sig  . latanoprost (XALATAN) 0.005 % ophthalmic solution Place into both eyes daily.    No current facility-administered medications  on file prior to visit.    ROS  Constitutional: Denies any fever or chills Gastrointestinal: No reported hemesis, hematochezia, vomiting, or acute GI distress Musculoskeletal: Denies any acute onset joint swelling, redness, loss of ROM, or weakness Neurological: No reported episodes of acute onset apraxia, aphasia, dysarthria, agnosia, amnesia, paralysis, loss of coordination, or loss of consciousness  Allergies  Mr. Cockerell has No Known Allergies.  PFSH  Drug: Mr. Davalos  has no drug history on file. Alcohol:  reports that he drinks alcohol. Tobacco:  reports that he has never smoked. He has never used smokeless tobacco. Medical:  has a past medical history of Genital disorder, male (08/16/2005); Lumbago (01/17/2009); Pain syndrome, chronic; and Sciatica. Family: family history is not on file.  Past Surgical History:  Procedure Laterality Date  . BACK SURGERY  02/2009   ruptured disk   Constitutional Exam  General appearance: Well nourished, well developed, and well hydrated. In no apparent acute distress Vitals:   07/01/16 0826  BP: (!) 143/90  Pulse: 64  Resp: 18  Temp: 98.3 F (36.8 C)  TempSrc: Oral  SpO2: 100%  Weight: 200 lb (90.7 kg)  Height: 6' (1.829 m)   BMI Assessment: Estimated body mass index is 27.12 kg/m as calculated from the following:   Height as of this encounter: 6' (1.829 m).   Weight as of this encounter: 200 lb (90.7 kg).  BMI interpretation table: BMI level Category Range association with higher incidence of chronic pain  <18 kg/m2 Underweight   18.5-24.9 kg/m2 Ideal body weight   25-29.9 kg/m2 Overweight Increased incidence by 20%  30-34.9 kg/m2 Obese (Class I) Increased incidence by 68%  35-39.9 kg/m2 Severe obesity (Class II) Increased incidence by 136%  >40 kg/m2 Extreme obesity (Class III) Increased incidence by 254%   BMI Readings from Last 4 Encounters:  07/01/16 27.12 kg/m  04/06/16 27.59 kg/m  02/24/16 28.37 kg/m  01/15/16  27.89 kg/m   Wt Readings from Last 4 Encounters:  07/01/16 200 lb (90.7 kg)  04/06/16 197 lb 12.8 oz (89.7 kg)  02/24/16 203 lb 6.4 oz (92.3 kg)  01/15/16 200 lb (90.7 kg)  Psych/Mental status: Alert, oriented x 3 (person, place, & time)       Eyes: PERLA Respiratory: No evidence of acute respiratory distress  Cervical Spine Exam  Inspection: No masses, redness, or swelling Alignment: Symmetrical Functional ROM: Unrestricted ROM      Stability: No instability detected Muscle strength & Tone: Functionally intact Sensory: Unimpaired Palpation: No palpable anomalies              Upper Extremity (UE) Exam    Side: Right upper extremity  Side: Left upper extremity  Inspection: No masses, redness, swelling, or asymmetry. No contractures  Inspection: No masses, redness, swelling, or asymmetry. No contractures  Functional ROM: Unrestricted ROM          Functional  ROM: Unrestricted ROM          Muscle strength & Tone: Functionally intact  Muscle strength & Tone: Functionally intact  Sensory: Unimpaired  Sensory: Unimpaired  Palpation: No palpable anomalies              Palpation: No palpable anomalies              Specialized Test(s): Deferred         Specialized Test(s): Deferred          Thoracic Spine Exam  Inspection: No masses, redness, or swelling Alignment: Symmetrical Functional ROM: Unrestricted ROM Stability: No instability detected Sensory: Unimpaired Muscle strength & Tone: No palpable anomalies  Lumbar Spine Exam  Inspection: No masses, redness, or swelling Alignment: Symmetrical Functional ROM: Unrestricted ROM      Stability: No instability detected Muscle strength & Tone: Functionally intact Sensory: Unimpaired Palpation: No palpable anomalies       Provocative Tests: Lumbar Hyperextension and rotation test: evaluation deferred today       Patrick's Maneuver: evaluation deferred today                    Gait & Posture Assessment  Ambulation: Unassisted Gait:  Relatively normal for age and body habitus Posture: WNL   Lower Extremity Exam    Side: Right lower extremity  Side: Left lower extremity  Inspection: No masses, redness, swelling, or asymmetry. No contractures  Inspection: No masses, redness, swelling, or asymmetry. No contractures  Functional ROM: Unrestricted ROM          Functional ROM: Unrestricted ROM          Muscle strength & Tone: Functionally intact  Muscle strength & Tone: Functionally intact  Sensory: Unimpaired  Sensory: Unimpaired  Palpation: No palpable anomalies  Palpation: No palpable anomalies   Assessment  Primary Diagnosis & Pertinent Problem List: The primary encounter diagnosis was Chronic groin pain (Left). Diagnoses of Orchialgia (Left), Testicular pain (Left), Neurogenic pain, Chronic pain syndrome, and Long term current use of opiate analgesic were also pertinent to this visit.  Status Diagnosis  Controlled Controlled Controlled 1. Chronic groin pain (Left)   2. Orchialgia (Left)   3. Testicular pain (Left)   4. Neurogenic pain   5. Chronic pain syndrome   6. Long term current use of opiate analgesic     Problems updated and reviewed during this visit: No problems updated. Plan of Care  Pharmacotherapy (Medications Ordered): Meds ordered this encounter  Medications  . traMADol (ULTRAM) 50 MG tablet    Sig: Take 1 tablet (50 mg total) by mouth every 6 (six) hours as needed for severe pain.    Dispense:  120 tablet    Refill:  5    Do not place this medication, or any other prescription from our practice, on "Automatic Refill". Patient may have prescription filled one day early if pharmacy is closed on scheduled refill date.  . gabapentin (NEURONTIN) 300 MG capsule    Sig: Take 1 capsule (300 mg total) by mouth every 8 (eight) hours.    Dispense:  90 capsule    Refill:  5    Do not place this medication, or any other prescription from our practice, on "Automatic Refill". Patient may have prescription  filled one day early if pharmacy is closed on scheduled refill date.   New Prescriptions   No medications on file   Medications administered today: Mr. Podolak had no medications administered during this visit. Lab-work,  procedure(s), and/or referral(s): No orders of the defined types were placed in this encounter.  Imaging and/or referral(s): None  Interventional therapies: Planned, scheduled, and/or pending:   Not at this time.   Considering:   None at this time.    Palliative PRN treatment(s):   None at this time.    Provider-requested follow-up: Return in about 6 months (around 12/31/2016) for Med-Mgmt, by NP.  Future Appointments Date Time Provider [place]  12/30/2016 8:15 AM Vevelyn Francois, NP [facility] None   Primary Care Physician: Margo Common, PA Location: [facility] Note by: Kathlen Brunswick Dossie Arbour, M.D, DABA, DABAPM, DABPM, DABIPP, FIPP Date: 07/01/2016; Time: 10:45 AM  Patient instructions provided during this appointment: Patient Instructions   ____________________________________________________________________________________________  Appointment Policy  It is our goal and responsibility to provide the medical community with assistance in the evaluation and management of patients with chronic pain. Unfortunately our resources are limited. Because we do not have an unlimited amount of time, or available appointments, we are required to closely monitor and manage their use. The following rules exist to maximize their use:  Patient's responsibilities: 1. Punctuality: You are required to be physically present and registered in our facility at least 30 minutes before your appointment. 2. Tardiness: The cutoff is your appointment time. If you have an appointment scheduled for 10:00 AM and you arrive at 10:01, you will be required to reschedule your appointment.  3. Plan ahead: Always assume that you will encounter  traffic on your way in. Plan for it. If you are dependent on a driver, make sure they understand these rules and the need to arrive early. 4. Other appointments and responsibilities: Avoid scheduling any other appointments before or after your pain clinic appointments.  5. Be prepared: Write down everything that you need to discuss with your healthcare provider and give this information to the admitting nurse. Write down the medications that you will need refilled. Bring your pills and bottles (even the empty ones), to all of your appointments, except for those where a procedure is scheduled. 6. No children or pets: Find someone to take care of them. It is not appropriate to bring them in. 7. Scheduling changes: We request "advanced notification" of any changes or cancellations. 8. Advanced notification: Defined as a time period of more than 24 hours prior to the originally scheduled appointment. This allows for the appointment to be offered to other patients. 9. Rescheduling: When a visit is rescheduled, it will require the cancellation of the original appointment. For this reason they both fall within the category of "Cancellations".  10. Cancellations: They require advanced notification. Any cancellation less than 24 hours before the  appointment will be recorded as a "No Show". 11. No Show: Defined as an unkept appointment where the patient failed to notify or declare to the practice their intention or inability to keep the appointment.  Corrective process for repeat offenders:  1. Tardiness: Three (3) episodes of rescheduling due to late arrivals will be recorded as one (1) "No Show". 2. Cancellation or reschedule: Three (3) cancellations or rescheduling will be recorded as one (1) "No Show". 3. "No Shows": Three (3) "No Shows" within a 12 month period will result in discharge from the  practice.  ____________________________________________________________________________________________  ____________________________________________________________________________________________  Medication Rules  Applies to: All patients receiving prescriptions (written or electronic).  Pharmacy of record: Pharmacy where electronic prescriptions will be sent. If written prescriptions are taken to a different pharmacy, please inform the  nursing staff. The pharmacy listed in the electronic medical record should be the one where you would like electronic prescriptions to be sent.  Prescription refills: Only during scheduled appointments. Applies to both, written and electronic prescriptions.  NOTE: The following applies primarily to controlled substances (Opioid Pain Medications)  Patient's responsibilities: 12. Pain Pills: Bring all pain pills to every appointment (except for procedure appointments). 13. Pill Bottles: Bring pills in original pharmacy bottle. Always bring newest bottle. Bring bottle, even if empty. 14. Medication refills: You are responsible for knowing and keeping track of what medications you need refilled. The day before your appointment, write a list of all prescriptions that need to be refilled. Bring that list to your appointment and give it to the admitting nurse. Prescriptions will be written only during appointments. If you forget a medication, it will not be "Called in", "Faxed", or "electronically sent". You will need to get another appointment to get these prescribed. 15. Prescription Accuracy: You are responsible for carefully inspecting your prescriptions before leaving our office. Have the discharge nurse carefully go over each prescription with you, before taking them home. Make sure that your name is accurately spelled, that your address is correct. Check the name and dose of your medication to make sure it is accurate. Check the number of pills, and the written  instructions to make sure they are clear and accurate. Make sure that you are given enough medication to last until your next medication refill appointment. 16. Taking Medication: Take medication as prescribed. Never take more pills than instructed. Never take medication more frequently than prescribed. Taking less pills or less frequently is permitted and encouraged, when it comes to controlled substances (written prescriptions).  76. Inform other Doctors: Always inform, all of your healthcare providers, of all the medications you take. 18. Pain Medication from other Providers: You are not allowed to accept any additional pain medication from any other Doctor or Healthcare provider. There are two exceptions to this rule. (see below) In the event that you require additional pain medication, you are responsible for notifying us, as stated below. 19. Medication Agreement: You are responsible for carefully reading and following our Medication Agreement. This must be signed before receiving any prescriptions from our practice. Safely store a copy of your signed Agreement. Violations to the Agreement will result in no further prescriptions. (Additional copies of our Medication Agreement are available upon request.) 20. Laws, Rules, & Regulations: All patients are expected to follow all Federal and [employer], Rules, [employer]. Ignorance of the Laws does not constitute a valid excuse.  Exceptions: There are only two exceptions to the rule of not receiving pain medications from other Healthcare Providers. 4. Exception #1 (Emergencies): In the event of an emergency (i.e.: accident requiring emergency care), you are allowed to receive additional pain medication. However, you are responsible for: As soon as you are able, call our office [phone], at any time of the day or night, and leave a message stating your name, the date and nature of the emergency, and the name and dose of the medication  prescribed. In the event that your call is answered by a member of our staff, make sure to document and save the date, time, and the name of the person that took your information.  5. Exception #2 (Planned Surgery): In the event that you are scheduled by another doctor or dentist to have any type of surgery or procedure, you are allowed (for a period no longer  than 30 days), to receive additional pain medication, for the acute post-op pain. However, in this case, you are responsible for picking up a copy of our "Post-op Pain Management for Surgeons" handout, and giving it to your surgeon or dentist. This document is available at our office, and does not require an appointment to obtain it. Simply go to our office during business hours (Monday-Thursday from 8:00 AM to 4:00 PM) (Friday 8:00 AM to 12:00 Noon) or if you have a scheduled appointment with [place], prior to your surgery, and ask for it by name. In addition, you will need to provide [place] with your name, name of your surgeon, type of surgery, and date of procedure or surgery.  ____________________________________________________________________________________________ ____________________________________________________________________________________________  Pain Scale  Introduction: The pain score used by this practice is the Verbal Numerical Rating Scale (VNRS-11). This is an 11-point scale. It is for adults and children 10 years or older. There are significant differences in how the pain score is reported, used, and applied. Forget everything you learned in the past and learn this scoring system.  General Information: The scale should reflect your current level of pain. Unless you are specifically asked for the level of your worst pain, or your average pain. If you are asked for one of these two, then it should be understood that it is over the past 24 hours.  Basic Activities of Daily Living (ADL): Personal hygiene, dressing, eating, transferring,  and using restroom.  Instructions: Most patients tend to report their level of pain as a combination of two factors, their physical pain and their psychosocial pain. This last one is also known as "suffering" and it is reflection of how physical pain affects you socially and psychologically. From now on, report them separately. From this point on, when asked to report your pain level, report only your physical pain. Use the following table for reference.  Pain Clinic Pain Levels (0-5/10)  Pain Level Score  Description  No Pain 0   Mild pain 1 Nagging, annoying, but does not interfere with basic activities of daily living (ADL). Patients are able to eat, bathe, get dressed, toileting (being able to get on and off the toilet and perform personal hygiene functions), transfer (move in and out of bed or a chair without assistance), and maintain continence (able to control bladder and bowel functions). Blood pressure and heart rate are unaffected. A normal heart rate for a healthy adult ranges from 60 to 100 bpm (beats per minute).   Mild to moderate pain 2 Noticeable and distracting. Impossible to hide from other people. More frequent flare-ups. Still possible to adapt and function close to normal. It can be very annoying and may have occasional stronger flare-ups. With discipline, patients may get used to it and adapt.   Moderate pain 3 Interferes significantly with activities of daily living (ADL). It becomes difficult to feed, bathe, get dressed, get on and off the toilet or to perform personal hygiene functions. Difficult to get in and out of bed or a chair without assistance. Very distracting. With effort, it can be ignored when deeply involved in activities.   Moderately severe pain 4 Impossible to ignore for more than a few minutes. With effort, patients may still be able to manage work or participate in some social activities. Very difficult to concentrate. Signs of autonomic nervous system  discharge are evident: dilated pupils (mydriasis); mild sweating (diaphoresis); sleep interference. Heart rate becomes elevated (>115 bpm). Diastolic blood pressure (lower number) rises above 100  mmHg. Patients find relief in laying down and not moving.   Severe pain 5 Intense and extremely unpleasant. Associated with frowning face and frequent crying. Pain overwhelms the senses.  Ability to do any activity or maintain social relationships becomes significantly limited. Conversation becomes difficult. Pacing back and forth is common, as getting into a comfortable position is nearly impossible. Pain wakes you up from deep sleep. Physical signs will be obvious: pupillary dilation; increased sweating; goosebumps; brisk reflexes; cold, clammy hands and feet; nausea, vomiting or dry heaves; loss of appetite; significant sleep disturbance with inability to fall asleep or to remain asleep. When persistent, significant weight loss is observed due to the complete loss of appetite and sleep deprivation.  Blood pressure and heart rate becomes significantly elevated. Caution: If elevated blood pressure triggers a pounding headache associated with blurred vision, then the patient should immediately seek attention at an urgent or emergency care unit, as these may be signs of an impending stroke.    Emergency Department Pain Levels (6-10/10)  Emergency Room Pain 6 Severely limiting. Requires emergency care and should not be seen or managed at an outpatient pain management facility. Communication becomes difficult and requires great effort. Assistance to reach the emergency department may be required. Facial flushing and profuse sweating along with potentially dangerous increases in heart rate and blood pressure will be evident.   Distressing pain 7 Self-care is very difficult. Assistance is required to transport, or use restroom. Assistance to reach the emergency department will be required. Tasks requiring coordination,  such as bathing and getting dressed become very difficult.   Disabling pain 8 Self-care is no longer possible. At this level, pain is disabling. The individual is unable to do even the most "basic" activities such as walking, eating, bathing, dressing, transferring to a bed, or toileting. Fine motor skills are lost. It is difficult to think clearly.   Incapacitating pain 9 Pain becomes incapacitating. Thought processing is no longer possible. Difficult to remember your own name. Control of movement and coordination are lost.   The worst pain imaginable 10 At this level, most patients pass out from pain. When this level is reached, collapse of the autonomic nervous system occurs, leading to a sudden drop in blood pressure and heart rate. This in turn results in a temporary and dramatic drop in blood flow to the brain, leading to a loss of consciousness. Fainting is one of the body's self defense mechanisms. Passing out puts the brain in a calmed state and causes it to shut down for a while, in order to begin the healing process.    Summary: 1. Refer to this scale when providing [place] with your pain level. 2. Be accurate and careful when reporting your pain level. This will help with your care. 3. Over-reporting your pain level will lead to loss of credibility. 4. Even a level of 1/10 means that there is pain and will be treated at our facility. 5. High, inaccurate reporting will be documented as "Symptom Exaggeration", leading to loss of credibility and suspicions of possible secondary gains such as obtaining more narcotics, or wanting to appear disabled, for fraudulent reasons. 6. Only pain levels of 5 or below will be seen at our facility. 7. Pain levels of 6 and above will be sent to the Emergency Department and the appointment cancelled.  You were given one prescription forTramadol to last 6 months ____________________________________________________________________________________________

## 2016-07-01 ENCOUNTER — Ambulatory Visit: Payer: Managed Care, Other (non HMO) | Attending: Pain Medicine | Admitting: Pain Medicine

## 2016-07-01 ENCOUNTER — Encounter: Payer: Self-pay | Admitting: Pain Medicine

## 2016-07-01 VITALS — BP 143/90 | HR 64 | Temp 98.3°F | Resp 18 | Ht 72.0 in | Wt 200.0 lb

## 2016-07-01 DIAGNOSIS — G629 Polyneuropathy, unspecified: Secondary | ICD-10-CM | POA: Insufficient documentation

## 2016-07-01 DIAGNOSIS — R1032 Left lower quadrant pain: Secondary | ICD-10-CM

## 2016-07-01 DIAGNOSIS — Z79891 Long term (current) use of opiate analgesic: Secondary | ICD-10-CM | POA: Insufficient documentation

## 2016-07-01 DIAGNOSIS — G894 Chronic pain syndrome: Secondary | ICD-10-CM | POA: Diagnosis not present

## 2016-07-01 DIAGNOSIS — N50819 Testicular pain, unspecified: Secondary | ICD-10-CM

## 2016-07-01 DIAGNOSIS — M792 Neuralgia and neuritis, unspecified: Secondary | ICD-10-CM | POA: Diagnosis not present

## 2016-07-01 DIAGNOSIS — N50812 Left testicular pain: Secondary | ICD-10-CM | POA: Diagnosis present

## 2016-07-01 DIAGNOSIS — G8929 Other chronic pain: Secondary | ICD-10-CM

## 2016-07-01 MED ORDER — TRAMADOL HCL 50 MG PO TABS: 50.0000 mg | ORAL_TABLET | Freq: Four times a day (QID) | ORAL | 5 refills | Status: DC | PRN

## 2016-07-01 MED ORDER — GABAPENTIN 300 MG PO CAPS: 300.0000 mg | ORAL_CAPSULE | Freq: Three times a day (TID) | ORAL | 5 refills | Status: DC

## 2016-07-01 NOTE — Progress Notes (Signed)
Nursing Pain Medication Assessment:  Safety precautions to be maintained throughout the outpatient stay will include: orient to surroundings, keep bed in low position, maintain call bell within reach at all times, provide assistance with transfer out of bed and ambulation.  Medication Inspection Compliance: Pill count conducted under aseptic conditions, in front of the patient. Neither the pills nor the bottle was removed from the patient's sight at any time. Once count was completed pills were immediately returned to the patient in their original bottle.  Medication: Tramadol (Ultram) Pill/Patch Count: 49 of 120 pills remain Pill/Patch Appearance: Markings consistent with prescribed medication Bottle Appearance: Standard pharmacy container. Clearly labeled. Filled Date: 05 / 18 / 2018 Last Medication intake:  Today

## 2016-07-01 NOTE — Progress Notes (Deleted)
Nursing Pain Medication Assessment:  On [name] regular appointment, he did not comply with our medication refill policy of bringing his pills and bottle(s) to be examined and counted. As a consequence, his prescriptions were written and held, until he could bring back the medications to be examined. He comes in now to comply with this requirement.  Medication #1: Tramadol (Ultram) Pill/Patch Count: 49 of 120 pills remain Bottle Appearance: Standard pharmacy container. Clearly labeled. Filled Date: 05 / 17 / 2018 Last Medication intake:  Today  Medication #2: *** Pill/Patch Count: *** of *** pills remain Bottle Appearance: Standard pharmacy container. Clearly labeled. Filled Date: *** / *** / 2018 Last Medication intake:  Today  Medication #3: *** Pill/Patch Count: *** of *** pills remain Bottle Appearance: Standard pharmacy container. Clearly labeled. Filled Date: *** / *** / 2018 Last Medication intake:  Today  Prescriptions: Written prescriptions to last for *** given to patient once pill count and bottle inspection was completed.  Date: 07/01/16; Time: 8:21 AM

## 2016-07-01 NOTE — Patient Instructions (Addendum)
____________________________________________________________________________________________  Appointment Policy  It is our goal and responsibility to provide the medical community with assistance in the evaluation and management of patients with chronic pain. Unfortunately our resources are limited. Because we do not have an unlimited amount of time, or available appointments, we are required to closely monitor and manage their use. The following rules exist to maximize their use:  Patient's responsibilities: 1. Punctuality: You are required to be physically present and registered in our facility at least 30 minutes before your appointment. 2. Tardiness: The cutoff is your appointment time. If you have an appointment scheduled for 10:00 AM and you arrive at 10:01, you will be required to reschedule your appointment.  3. Plan ahead: Always assume that you will encounter traffic on your way in. Plan for it. If you are dependent on a driver, make sure they understand these rules and the need to arrive early. 4. Other appointments and responsibilities: Avoid scheduling any other appointments before or after your pain clinic appointments.  5. Be prepared: Write down everything that you need to discuss with your healthcare provider and give this information to the admitting nurse. Write down the medications that you will need refilled. Bring your pills and bottles (even the empty ones), to all of your appointments, except for those where a procedure is scheduled. 6. No children or pets: Find someone to take care of them. It is not appropriate to bring them in. 7. Scheduling changes: We request "advanced notification" of any changes or cancellations. 8. Advanced notification: Defined as a time period of more than 24 hours prior to the originally scheduled appointment. This allows for the appointment to be offered to other patients. 9. Rescheduling: When a visit is rescheduled, it will require the  cancellation of the original appointment. For this reason they both fall within the category of "Cancellations".  10. Cancellations: They require advanced notification. Any cancellation less than 24 hours before the  appointment will be recorded as a "No Show". 11. No Show: Defined as an unkept appointment where the patient failed to notify or declare to the practice their intention or inability to keep the appointment.  Corrective process for repeat offenders:  1. Tardiness: Three (3) episodes of rescheduling due to late arrivals will be recorded as one (1) "No Show". 2. Cancellation or reschedule: Three (3) cancellations or rescheduling will be recorded as one (1) "No Show". 3. "No Shows": Three (3) "No Shows" within a 12 month period will result in discharge from the practice.  ____________________________________________________________________________________________  ____________________________________________________________________________________________  Medication Rules  Applies to: All patients receiving prescriptions (written or electronic).  Pharmacy of record: Pharmacy where electronic prescriptions will be sent. If written prescriptions are taken to a different pharmacy, please inform the nursing staff. The pharmacy listed in the electronic medical record should be the one where you would like electronic prescriptions to be sent.  Prescription refills: Only during scheduled appointments. Applies to both, written and electronic prescriptions.  NOTE: The following applies primarily to controlled substances (Opioid Pain Medications)  Patient's responsibilities: 12. Pain Pills: Bring all pain pills to every appointment (except for procedure appointments). 13. Pill Bottles: Bring pills in original pharmacy bottle. Always bring newest bottle. Bring bottle, even if empty. 14. Medication refills: You are responsible for knowing and keeping track of what medications you need  refilled. The day before your appointment, write a list of all prescriptions that need to be refilled. Bring that list to your appointment and give it to the admitting  nurse. Prescriptions will be written only during appointments. If you forget a medication, it will not be "Called in", "Faxed", or "electronically sent". You will need to get another appointment to get these prescribed. 15. Prescription Accuracy: You are responsible for carefully inspecting your prescriptions before leaving our office. Have the discharge nurse carefully go over each prescription with you, before taking them home. Make sure that your name is accurately spelled, that your address is correct. Check the name and dose of your medication to make sure it is accurate. Check the number of pills, and the written instructions to make sure they are clear and accurate. Make sure that you are given enough medication to last until your next medication refill appointment. 16. Taking Medication: Take medication as prescribed. Never take more pills than instructed. Never take medication more frequently than prescribed. Taking less pills or less frequently is permitted and encouraged, when it comes to controlled substances (written prescriptions).  109. Inform other Doctors: Always inform, all of your healthcare providers, of all the medications you take. 18. Pain Medication from other Providers: You are not allowed to accept any additional pain medication from any other Doctor or Healthcare provider. There are two exceptions to this rule. (see below) In the event that you require additional pain medication, you are responsible for notifying us, as stated below. 19. Medication Agreement: You are responsible for carefully reading and following our Medication Agreement. This must be signed before receiving any prescriptions from our practice. Safely store a copy of your signed Agreement. Violations to the Agreement will result in no further  prescriptions. (Additional copies of our Medication Agreement are available upon request.) 20. Laws, Rules, & Regulations: All patients are expected to follow all Federal and [employer], Rules, [employer]. Ignorance of the Laws does not constitute a valid excuse.  Exceptions: There are only two exceptions to the rule of not receiving pain medications from other Healthcare Providers. 4. Exception #1 (Emergencies): In the event of an emergency (i.e.: accident requiring emergency care), you are allowed to receive additional pain medication. However, you are responsible for: As soon as you are able, call our office [phone], at any time of the day or night, and leave a message stating your name, the date and nature of the emergency, and the name and dose of the medication prescribed. In the event that your call is answered by a member of our staff, make sure to document and save the date, time, and the name of the person that took your information.  5. Exception #2 (Planned Surgery): In the event that you are scheduled by another doctor or dentist to have any type of surgery or procedure, you are allowed (for a period no longer than 30 days), to receive additional pain medication, for the acute post-op pain. However, in this case, you are responsible for picking up a copy of our "Post-op Pain Management for Surgeons" handout, and giving it to your surgeon or dentist. This document is available at our office, and does not require an appointment to obtain it. Simply go to our office during business hours (Monday-Thursday from 8:00 AM to 4:00 PM) (Friday 8:00 AM to 12:00 Noon) or if you have a scheduled appointment with [place], prior to your surgery, and ask for it by name. In addition, you will need to provide [place] with your name, name of your surgeon, type of surgery, and date of procedure or  surgery.  ____________________________________________________________________________________________ ____________________________________________________________________________________________  Pain Scale  Introduction:  The pain score used by this practice is the Verbal Numerical Rating Scale (VNRS-11). This is an 11-point scale. It is for adults and children 10 years or older. There are significant differences in how the pain score is reported, used, and applied. Forget everything you learned in the past and learn this scoring system.  General Information: The scale should reflect your current level of pain. Unless you are specifically asked for the level of your worst pain, or your average pain. If you are asked for one of these two, then it should be understood that it is over the past 24 hours.  Basic Activities of Daily Living (ADL): Personal hygiene, dressing, eating, transferring, and using restroom.  Instructions: Most patients tend to report their level of pain as a combination of two factors, their physical pain and their psychosocial pain. This last one is also known as "suffering" and it is reflection of how physical pain affects you socially and psychologically. From now on, report them separately. From this point on, when asked to report your pain level, report only your physical pain. Use the following table for reference.  Pain Clinic Pain Levels (0-5/10)  Pain Level Score  Description  No Pain 0   Mild pain 1 Nagging, annoying, but does not interfere with basic activities of daily living (ADL). Patients are able to eat, bathe, get dressed, toileting (being able to get on and off the toilet and perform personal hygiene functions), transfer (move in and out of bed or a chair without assistance), and maintain continence (able to control bladder and bowel functions). Blood pressure and heart rate are unaffected. A normal heart rate for a healthy adult ranges from 60 to 100 bpm  (beats per minute).   Mild to moderate pain 2 Noticeable and distracting. Impossible to hide from other people. More frequent flare-ups. Still possible to adapt and function close to normal. It can be very annoying and may have occasional stronger flare-ups. With discipline, patients may get used to it and adapt.   Moderate pain 3 Interferes significantly with activities of daily living (ADL). It becomes difficult to feed, bathe, get dressed, get on and off the toilet or to perform personal hygiene functions. Difficult to get in and out of bed or a chair without assistance. Very distracting. With effort, it can be ignored when deeply involved in activities.   Moderately severe pain 4 Impossible to ignore for more than a few minutes. With effort, patients may still be able to manage work or participate in some social activities. Very difficult to concentrate. Signs of autonomic nervous system discharge are evident: dilated pupils (mydriasis); mild sweating (diaphoresis); sleep interference. Heart rate becomes elevated (>115 bpm). Diastolic blood pressure (lower number) rises above 100 mmHg. Patients find relief in laying down and not moving.   Severe pain 5 Intense and extremely unpleasant. Associated with frowning face and frequent crying. Pain overwhelms the senses.  Ability to do any activity or maintain social relationships becomes significantly limited. Conversation becomes difficult. Pacing back and forth is common, as getting into a comfortable position is nearly impossible. Pain wakes you up from deep sleep. Physical signs will be obvious: pupillary dilation; increased sweating; goosebumps; brisk reflexes; cold, clammy hands and feet; nausea, vomiting or dry heaves; loss of appetite; significant sleep disturbance with inability to fall asleep or to remain asleep. When persistent, significant weight loss is observed due to the complete loss of appetite and sleep deprivation.  Blood pressure and heart  rate becomes  significantly elevated. Caution: If elevated blood pressure triggers a pounding headache associated with blurred vision, then the patient should immediately seek attention at an urgent or emergency care unit, as these may be signs of an impending stroke.    Emergency Department Pain Levels (6-10/10)  Emergency Room Pain 6 Severely limiting. Requires emergency care and should not be seen or managed at an outpatient pain management facility. Communication becomes difficult and requires great effort. Assistance to reach the emergency department may be required. Facial flushing and profuse sweating along with potentially dangerous increases in heart rate and blood pressure will be evident.   Distressing pain 7 Self-care is very difficult. Assistance is required to transport, or use restroom. Assistance to reach the emergency department will be required. Tasks requiring coordination, such as bathing and getting dressed become very difficult.   Disabling pain 8 Self-care is no longer possible. At this level, pain is disabling. The individual is unable to do even the most "basic" activities such as walking, eating, bathing, dressing, transferring to a bed, or toileting. Fine motor skills are lost. It is difficult to think clearly.   Incapacitating pain 9 Pain becomes incapacitating. Thought processing is no longer possible. Difficult to remember your own name. Control of movement and coordination are lost.   The worst pain imaginable 10 At this level, most patients pass out from pain. When this level is reached, collapse of the autonomic nervous system occurs, leading to a sudden drop in blood pressure and heart rate. This in turn results in a temporary and dramatic drop in blood flow to the brain, leading to a loss of consciousness. Fainting is one of the body's self defense mechanisms. Passing out puts the brain in a calmed state and causes it to shut down for a while, in order to begin the  healing process.    Summary: 1. Refer to this scale when providing us with your pain level. 2. Be accurate and careful when reporting your pain level. This will help with your care. 3. Over-reporting your pain level will lead to loss of credibility. 4. Even a level of 1/10 means that there is pain and will be treated at our facility. 5. High, inaccurate reporting will be documented as "Symptom Exaggeration", leading to loss of credibility and suspicions of possible secondary gains such as obtaining more narcotics, or wanting to appear disabled, for fraudulent reasons. 6. Only pain levels of 5 or below will be seen at our facility. 7. Pain levels of 6 and above will be sent to the Emergency Department and the appointment cancelled.  You were given one prescription forTramadol to last 6 months ____________________________________________________________________________________________

## 2016-11-15 ENCOUNTER — Encounter: Payer: Self-pay | Admitting: Family Medicine

## 2016-11-19 ENCOUNTER — Other Ambulatory Visit: Payer: Self-pay | Admitting: Urology

## 2016-11-19 DIAGNOSIS — R310 Gross hematuria: Secondary | ICD-10-CM

## 2016-11-26 ENCOUNTER — Ambulatory Visit
Admission: RE | Admit: 2016-11-26 | Discharge: 2016-11-26 | Disposition: A | Discharge status: Home / Self Care | Payer: Managed Care, Other (non HMO) | Source: Ambulatory Visit | Attending: Urology | Admitting: Urology

## 2016-11-26 DIAGNOSIS — R310 Gross hematuria: Secondary | ICD-10-CM | POA: Insufficient documentation

## 2016-11-26 MED ORDER — IOPAMIDOL (ISOVUE-300) INJECTION 61%
125.0000 mL | Freq: Once | INTRAVENOUS | Status: AC | PRN
  Administered 2016-11-26: 125 mL via INTRAVENOUS

## 2016-12-30 ENCOUNTER — Encounter: Payer: Self-pay | Admitting: Nurse Practitioner

## 2016-12-30 ENCOUNTER — Ambulatory Visit: Payer: Managed Care, Other (non HMO) | Attending: Nurse Practitioner | Admitting: Nurse Practitioner

## 2016-12-30 ENCOUNTER — Other Ambulatory Visit: Payer: Self-pay

## 2016-12-30 VITALS — BP 155/78 | HR 68 | Temp 97.8°F | Resp 16 | Ht 71.0 in | Wt 200.0 lb

## 2016-12-30 DIAGNOSIS — Z5181 Encounter for therapeutic drug level monitoring: Secondary | ICD-10-CM | POA: Insufficient documentation

## 2016-12-30 DIAGNOSIS — Z79891 Long term (current) use of opiate analgesic: Secondary | ICD-10-CM | POA: Insufficient documentation

## 2016-12-30 DIAGNOSIS — R1032 Left lower quadrant pain: Secondary | ICD-10-CM | POA: Insufficient documentation

## 2016-12-30 DIAGNOSIS — G8929 Other chronic pain: Secondary | ICD-10-CM

## 2016-12-30 DIAGNOSIS — Z79899 Other long term (current) drug therapy: Secondary | ICD-10-CM | POA: Diagnosis not present

## 2016-12-30 DIAGNOSIS — N50812 Left testicular pain: Secondary | ICD-10-CM | POA: Insufficient documentation

## 2016-12-30 DIAGNOSIS — G894 Chronic pain syndrome: Secondary | ICD-10-CM | POA: Diagnosis present

## 2016-12-30 DIAGNOSIS — M545 Low back pain: Secondary | ICD-10-CM | POA: Insufficient documentation

## 2016-12-30 DIAGNOSIS — N50819 Testicular pain, unspecified: Secondary | ICD-10-CM

## 2016-12-30 DIAGNOSIS — M792 Neuralgia and neuritis, unspecified: Secondary | ICD-10-CM

## 2016-12-30 MED ORDER — GABAPENTIN 300 MG PO CAPS: 300.0000 mg | ORAL_CAPSULE | Freq: Three times a day (TID) | ORAL | 5 refills | Status: DC

## 2016-12-30 MED ORDER — TRAMADOL HCL 50 MG PO TABS: 50.0000 mg | ORAL_TABLET | Freq: Four times a day (QID) | ORAL | 5 refills | Status: DC | PRN

## 2016-12-30 NOTE — Progress Notes (Signed)
Nursing Pain Medication Assessment:  Safety precautions to be maintained throughout the outpatient stay will include: orient to surroundings, keep bed in low position, maintain call bell within reach at all times, provide assistance with transfer out of bed and ambulation.  Medication Inspection Compliance: Pill count conducted under aseptic conditions, in front of the patient. Neither the pills nor the bottle was removed from the patient's sight at any time. Once count was completed pills were immediately returned to the patient in their original bottle.  Medication: Tramadol (Ultram) Pill/Patch Count: 115 of 120 pills remain Pill/Patch Appearance: Markings consistent with prescribed medication Bottle Appearance: Standard pharmacy container. Clearly labeled. Filled Date:11/16 / 2018 Last Medication intake:  Today

## 2016-12-30 NOTE — Progress Notes (Signed)
Patient's Name: Dylan [name]  MRN: [id number]  Referring Provider: Margo Common, PA  DOB: 11-16-1950  PCP: Margo Common, PA  DOS: 12/30/2016  Note by: Vevelyn Francois NP  Service setting: Ambulatory outpatient  Specialty: Interventional Pain Management  Location: [facility] (AMB) Pain Management Facility    Patient type: Established    Primary Reason(s) for Visit: Encounter for prescription drug management. (Level of risk: moderate)  CC: Testicle Pain (left)  HPI  Dylan [name] is a 66 y.o. year old, male patient, who comes today for a medication management evaluation. 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 His primarily concern today is the Testicle Pain (left)  Pain Assessment: Location: Left Scrotum Radiating:   Onset: More than a month ago Duration: Chronic pain Quality: Aching, Dull, Sharp Severity: 1 /10 (self-reported pain score)  Note: Reported level is compatible with observation.                          Effect on ADL:   Timing:   Modifying factors: nothign  Dylan [name] was last scheduled for an appointment on Visit date not found for medication management. During today's appointment we reviewed Dylan [name]'s chronic pain status, as well as his outpatient medication regimen. He states that his pain is stable. He denies any other areas of pain. He denies any side effects of his medicatin. He admits that it is effective for treating his pain.   The patient  has no drug history on file. His body mass index is 27.89 kg/m.  Further details on both, my assessment(s), as well as  the proposed treatment plan, please see below.  Controlled Substance Pharmacotherapy Assessment REMS (Risk Evaluation and Mitigation Strategy)  Analgesic:Tramadol 50 mg every 6 hours (200 mg/day) MME/day:20 mg/day   Landis Martins, RN  12/30/2016  8:09 AM  Sign at close encounter Nursing Pain Medication Assessment:  Safety precautions to be maintained throughout the outpatient stay will include: orient to surroundings, keep bed in low position, maintain call bell within reach at all times, provide assistance with transfer out of bed and ambulation.  Medication Inspection Compliance: Pill count conducted under aseptic conditions, in front of the patient. Neither the pills nor the bottle was removed from the patient's sight at any time. Once count was completed pills were immediately returned to the patient in their original bottle.  Medication: Tramadol (Ultram) Pill/Patch Count: 115 of 120 pills remain Pill/Patch Appearance: Markings consistent with prescribed medication Bottle Appearance: Standard pharmacy container. Clearly labeled. Filled Date:11/16 / 2018 Last Medication intake:  Today   Pharmacokinetics: Liberation and absorption (onset of action): WNL Distribution (time to peak effect): WNL Metabolism and excretion (duration of action): WNL         Pharmacodynamics: Desired effects: Analgesia: Dylan [name] reports >50% benefit. Functional ability: Patient reports that medication allows him to accomplish basic ADLs Clinically meaningful improvement in function (CMIF): Sustained CMIF goals met Perceived effectiveness: Described as relatively effective, allowing for increase in activities of daily living (ADL) Undesirable effects: Side-effects or Adverse reactions: None reported Monitoring: [place] PMP: Online review of the past 72-monthperiod conducted.  Compliant with practice rules and regulations Last UDS on record: No results found for: SUMMARY UDS interpretation: Compliant           Medication Assessment Form: Reviewed. Patient indicates being compliant with therapy Treatment compliance: Compliant Risk Assessment Profile: Aberrant behavior: See prior evaluations. None observed or detected today Comorbid factors increasing risk of overdose: See prior notes. No additional risks detected today Risk of substance use disorder (SUD): Low  ORT Scoring interpretation table:  Score <3 = Low Risk for SUD  Score between 4-7 = Moderate Risk for SUD  Score >8 = High Risk for Opioid Abuse   Risk Mitigation Strategies:  Patient Counseling: Covered Patient-Prescriber Agreement (PPA): Present and active  Notification to other healthcare providers: Done  Pharmacologic Plan: No change in therapy, at this time  Laboratory Chemistry  Inflammation Markers (CRP: Acute Phase) (ESR: Chronic Phase) Lab Results  Component Value Date   CRP 1.7 (H) 01/19/2016   ESRSEDRATE 13 01/19/2016                 Rheumatology Markers No results found for: Elayne Guerin, [facility]              Renal Function Markers Lab Results  Component Value Date   BUN 15 01/19/2016   CREATININE 0.85 01/19/2016   GFRAA >60 01/19/2016   GFRNONAA >60 01/19/2016                 Hepatic Function Markers Lab Results  Component Value Date   AST 26 01/19/2016   ALT 19 01/19/2016   ALBUMIN 4.5 01/19/2016   ALKPHOS 62 01/19/2016   HCVAB NEGATIVE 02/28/2009                 Electrolytes Lab Results  Component Value Date   NA 136 01/19/2016   K 4.2 01/19/2016   CL 103 01/19/2016   CALCIUM 8.9 01/19/2016   MG 2.3 01/19/2016   PHOS 3.3 10/14/2011                 Neuropathy Markers Lab Results  Component Value Date   VITAMINB12 339 01/19/2016                 Bone Pathology Markers Lab Results  Component Value Date   VD25OH 22.3 (L) 01/23/2015   25OHVITD1 34 01/19/2016   25OHVITD2 1.2 01/19/2016   25OHVITD3 33 01/19/2016                 Coagulation  Parameters Lab Results  Component Value Date   PLT 267 03/01/2009                 Cardiovascular Markers Lab Results  Component Value Date   HGB 12.1 (L) 03/01/2009   HCT 35.5 (L) 03/01/2009                 CA Markers No results found for: CEA, CA125, LABCA2               Note: Lab results reviewed.  Recent Diagnostic Imaging Results  CT ABDOMEN PELVIS W WO CONTRAST CLINICAL DATA:  Gross hematuria  EXAM: CT ABDOMEN AND PELVIS WITHOUT AND WITH CONTRAST  TECHNIQUE: Multidetector CT imaging of the abdomen and pelvis was performed following the standard protocol before and following the bolus administration of intravenous contrast.  CONTRAST:  140m ISOVUE-300 IOPAMIDOL (ISOVUE-300) INJECTION 61%  COMPARISON:  CT 03/23/2008  FINDINGS: Lower chest: Lung bases are clear.  Hepatobiliary:  No focal hepatic lesion. No biliary duct dilatation. Gallbladder is normal. Common bile duct is normal.  Pancreas: Pancreas is normal. No ductal dilatation. No pancreatic inflammation.  Spleen: Normal spleen  Adrenals/urinary tract: Adrenal glands are normal. No nephrolithiasis ureterolithiasis. No enhancing renal cortical lesion. No filling defects within collecting systems or ureters. No bladder calculi, enhancing bladder lesions, or filling defect within the bladder.  Stomach/Bowel: Stomach, small bowel, appendix, and cecum are normal. The colon and rectosigmoid colon are normal.  Vascular/Lymphatic: Abdominal aorta is normal caliber with atherosclerotic calcification. There is no retroperitoneal or periportal lymphadenopathy. No pelvic lymphadenopathy.  Reproductive: Prostate normal  Other: No free fluid.  Musculoskeletal: No aggressive osseous lesion.  IMPRESSION: 1. No explanation for hematuria. No nephrolithiasis, ureterolithiasis, enhancing renal cortical lesion, or filling defects within the collecting systems. 2. No bladder stones or filling defects in the  bladder which does not excluded a bladder lesion.  Electronically Signed   By: Suzy Bouchard M.D.   On: 11/26/2016 09:22  Complexity Note: Imaging results reviewed. Results shared with Dylan [name], using Layman's terms.                         Meds   Current Outpatient Medications:  .  gabapentin (NEURONTIN) 300 MG capsule, Take 1 capsule (300 mg total) by mouth every 8 (eight) hours., Disp: 90 capsule, Rfl: 5 .  latanoprost (XALATAN) 0.005 % ophthalmic solution, Place into both eyes daily. , Disp: , Rfl:  .  [START ON 01/16/2017] traMADol (ULTRAM) 50 MG tablet, Take 1 tablet (50 mg total) by mouth every 6 (six) hours as needed for severe pain., Disp: 120 tablet, Rfl: 5  ROS  Constitutional: Denies any fever or chills Gastrointestinal: No reported hemesis, hematochezia, vomiting, or acute GI distress Musculoskeletal: Denies any acute onset joint swelling, redness, loss of ROM, or weakness Neurological: No reported episodes of acute onset apraxia, aphasia, dysarthria, agnosia, amnesia, paralysis, loss of coordination, or loss of consciousness  Allergies  Dylan [name] has No Known Allergies.  PFSH  Drug: Dylan [name]  has no drug history on file. Alcohol:  reports that he drinks alcohol. Tobacco:  reports that  has never smoked. he has never used smokeless tobacco. 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 Surgical: Dylan [name]  has a past surgical history that includes Back surgery (02/2009). Family: family history is not on file.  Constitutional Exam  General appearance: Well nourished, well developed, and well hydrated. In no apparent acute distress Vitals:   12/30/16 0806  BP: (!) 155/78  Pulse: 68  Resp: 16  Temp: 97.8 F (36.6 C)  TempSrc: Oral  SpO2: 100%  Weight: 200 lb (90.7 kg)  Height: 5' 11"  (1.803 m)   BMI Assessment: Estimated body mass index is 27.89 kg/m as  calculated from the following:   Height as of this encounter: 5' 11"  (1.803 m).   Weight as of this encounter: 200 lb (90.7 kg). Psych/Mental status: Alert, oriented x 3 (person, place, & time)       Eyes: PERLA Respiratory: No evidence of acute respiratory distress GU: deferred  Gait & Posture Assessment  Ambulation: Unassisted Gait: Relatively normal for age and body habitus Posture: WNL   Assessment  Primary Diagnosis & Pertinent Problem List: The primary encounter diagnosis was Chronic groin pain (Left). Diagnoses of Testicular pain (Left), Neurogenic pain, Chronic pain syndrome, and Long term current  use of opiate analgesic were also pertinent to this visit.  Status Diagnosis  Controlled Controlled Controlled 1. Chronic groin pain (Left)   2. Testicular pain (Left)   3. Neurogenic pain   4. Chronic pain syndrome   5. Long term current use of opiate analgesic     Problems updated and reviewed during this visit: No problems updated. Plan of Care  Pharmacotherapy (Medications Ordered): Meds ordered this encounter  Medications  . traMADol (ULTRAM) 50 MG tablet    Sig: Take 1 tablet (50 mg total) by mouth every 6 (six) hours as needed for severe pain.    Dispense:  120 tablet    Refill:  5    Do not place this medication, or any other prescription from our practice, on "Automatic Refill". Patient may have prescription filled one day early if pharmacy is closed on scheduled refill date.    Order Specific Question:   Supervising Provider    Answer:   Milinda Pointer [phone]  . gabapentin (NEURONTIN) 300 MG capsule    Sig: Take 1 capsule (300 mg total) by mouth every 8 (eight) hours.    Dispense:  90 capsule    Refill:  5    Do not place this medication, or any other prescription from our practice, on "Automatic Refill". Patient may have prescription filled one day early if pharmacy is closed on scheduled refill date.    Order Specific Question:   Supervising Provider     Answer:   Milinda Pointer [[id number]]  This SmartLink is deprecated. Use AVSMEDLIST instead to display the medication list for a patient. Medications administered today: Dylan [name]. Dylan "[name]" had no medications administered during this visit. Lab-work, procedure(s), and/or referral(s): Orders Placed This Encounter  Procedures  . ToxASSURE Select 13 (MW), Urine   Imaging and/or referral(s): None  Interventional therapies: Planned, scheduled, and/or pending:   Not at this time.   Considering:   Not at this time   Palliative PRN treatment(s):   Not at this time.    Provider-requested follow-up: Return in about 6 months (around 06/29/2017) for MedMgmt.  Future Appointments  Date Time Provider [place]  06/23/2017  8:30 AM Vevelyn Francois, NP [facility] None   Primary Care Physician: Margo Common, PA Location: [facility] Outpatient Pain Management Facility Note by: Vevelyn Francois NP Date: 12/30/2016; Time: 11:52 AM  Pain Score Disclaimer: We use the NRS-11 scale. This is a self-reported, subjective measurement of pain severity with only modest accuracy. It is used primarily to identify changes within a particular patient. It must be understood that outpatient pain scales are significantly less accurate that those used for research, where they can be applied under ideal controlled circumstances with minimal exposure to variables. In reality, the score is likely to be a combination of pain intensity and pain affect, where pain affect describes the degree of emotional arousal or changes in action readiness caused by the sensory experience of pain. Factors such as social and work situation, setting, emotional state, anxiety levels, expectation, and prior pain experience may influence pain perception and show large inter-individual differences that may also be affected by time variables.  Patient instructions provided during this appointment: Patient Instructions    ____________________________________________________________________________________________  Medication Rules  Applies to: All patients receiving prescriptions (written or electronic).  Pharmacy of record: Pharmacy where electronic prescriptions will be sent. If written prescriptions are taken to a different pharmacy, please inform the nursing staff. The pharmacy listed in the electronic medical record  should be the one where you would like electronic prescriptions to be sent.  Prescription refills: Only during scheduled appointments. Applies to both, written and electronic prescriptions.  NOTE: The following applies primarily to controlled substances (Opioid* Pain Medications).   Patient's responsibilities: 1. Pain Pills: Bring all pain pills to every appointment (except for procedure appointments). 2. Pill Bottles: Bring pills in original pharmacy bottle. Always bring newest bottle. Bring bottle, even if empty. 3. Medication refills: You are responsible for knowing and keeping track of what medications you need refilled. The day before your appointment, write a list of all prescriptions that need to be refilled. Bring that list to your appointment and give it to the admitting nurse. Prescriptions will be written only during appointments. If you forget a medication, it will not be "Called in", "Faxed", or "electronically sent". You will need to get another appointment to get these prescribed. 4. Prescription Accuracy: You are responsible for carefully inspecting your prescriptions before leaving our office. Have the discharge nurse carefully go over each prescription with you, before taking them home. Make sure that your name is accurately spelled, that your address is correct. Check the name and dose of your medication to make sure it is accurate. Check the number of pills, and the written instructions to make sure they are clear and accurate. Make sure that you are given enough medication to  last until your next medication refill appointment. 5. Taking Medication: Take medication as prescribed. Never take more pills than instructed. Never take medication more frequently than prescribed. Taking less pills or less frequently is permitted and encouraged, when it comes to controlled substances (written prescriptions).  6. Inform other Doctors: Always inform, all of your healthcare providers, of all the medications you take. 7. Pain Medication from other Providers: You are not allowed to accept any additional pain medication from any other Doctor or Healthcare provider. There are two exceptions to this rule. (see below) In the event that you require additional pain medication, you are responsible for notifying us, as stated below. 8. Medication Agreement: You are responsible for carefully reading and following our Medication Agreement. This must be signed before receiving any prescriptions from our practice. Safely store a copy of your signed Agreement. Violations to the Agreement will result in no further prescriptions. (Additional copies of our Medication Agreement are available upon request.) 9. Laws, Rules, & Regulations: All patients are expected to follow all Federal and [employer], Rules, [employer]. Ignorance of the Laws does not constitute a valid excuse. The use of any illegal substances is prohibited. 10. Adopted CDC guidelines & recommendations: Target dosing levels will be at or below 60 MME/day. Use of benzodiazepines** is not recommended.  Exceptions: There are only two exceptions to the rule of not receiving pain medications from other Healthcare Providers. 1. Exception #1 (Emergencies): In the event of an emergency (i.e.: accident requiring emergency care), you are allowed to receive additional pain medication. However, you are responsible for: As soon as you are able, call our office [phone], at any time of the day or night, and leave a message stating your  name, the date and nature of the emergency, and the name and dose of the medication prescribed. In the event that your call is answered by a member of our staff, make sure to document and save the date, time, and the name of the person that took your information.  2. Exception #2 (Planned Surgery): In the event that you are scheduled  by another doctor or dentist to have any type of surgery or procedure, you are allowed (for a period no longer than 30 days), to receive additional pain medication, for the acute post-op pain. However, in this case, you are responsible for picking up a copy of our "Post-op Pain Management for Surgeons" handout, and giving it to your surgeon or dentist. This document is available at our office, and does not require an appointment to obtain it. Simply go to our office during business hours (Monday-Thursday from 8:00 AM to 4:00 PM) (Friday 8:00 AM to 12:00 Noon) or if you have a scheduled appointment with [place], prior to your surgery, and ask for it by name. In addition, you will need to provide [place] with your name, name of your surgeon, type of surgery, and date of procedure or surgery.  *Opioid medications include: morphine, codeine, oxycodone, oxymorphone, hydrocodone, hydromorphone, meperidine, tramadol, tapentadol, buprenorphine, fentanyl, methadone. **Benzodiazepine medications include: diazepam (Valium), alprazolam (Xanax), clonazepam (Klonopine), lorazepam (Ativan), clorazepate (Tranxene), chlordiazepoxide (Librium), estazolam (Prosom), oxazepam (Serax), temazepam (Restoril), triazolam (Halcion)  ____________________________________________________________________________________________

## 2016-12-30 NOTE — Patient Instructions (Signed)

## 2017-01-04 ENCOUNTER — Encounter: Payer: Self-pay | Admitting: Nurse Practitioner

## 2017-01-04 LAB — TOXASSURE SELECT 13 (MW), URINE

## 2017-02-07 ENCOUNTER — Encounter: Payer: Self-pay | Admitting: Family Medicine

## 2017-02-07 ENCOUNTER — Ambulatory Visit: Payer: Managed Care, Other (non HMO) | Admitting: Family Medicine

## 2017-02-07 VITALS — BP 138/88 | HR 86 | Temp 98.1°F | Wt 206.6 lb

## 2017-02-07 DIAGNOSIS — J029 Acute pharyngitis, unspecified: Secondary | ICD-10-CM | POA: Diagnosis not present

## 2017-02-07 NOTE — Progress Notes (Signed)
       Patient: Dylan PaneRonald R Obryant Male    DOB: Jun 30, 1950   67 y.o.   MRN: [id number] Visit Date: 02/07/2017  Today's Provider: Dortha Kernennis Chrismon, PA   Chief Complaint  Patient presents with  . Tongue sore   Subjective:    HPI Tongue Sore Episode onset: 2 weeks ago. The onset was sudden and gradually worsening.Nothing relieves the symptoms or soreness. Nothing aggravates the symptoms.   Past Medical History:  Diagnosis Date  . 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 Lumbago 01/17/2009  . Pain syndrome, chronic   . Sciatica    Past Surgical History:  Procedure Laterality Date  . BACK SURGERY  02/2009   ruptured disk   No family history on file.  No Known Allergies  Current Outpatient Medications:  .  gabapentin (NEURONTIN) 300 MG capsule, Take 1 capsule (300 mg total) by mouth every 8 (eight) hours., Disp: 90 capsule, Rfl: 5 .  latanoprost (XALATAN) 0.005 % ophthalmic solution, Place into both eyes daily. , Disp: , Rfl:  .  traMADol (ULTRAM) 50 MG tablet, Take 1 tablet (50 mg total) by mouth every 6 (six) hours as needed for severe pain., Disp: 120 tablet, Rfl: 5  Review of Systems  Constitutional: Negative.   HENT:       Tongue soreness   Respiratory: Negative.   Cardiovascular: Negative.    Social History   Tobacco Use  . Smoking status: Never Smoker  . Smokeless tobacco: Never Used  Substance Use Topics  . Alcohol use: Yes    Comment: rarely alcohol   Objective:   BP 138/88 (BP Location: Right Arm, Patient Position: Sitting, Cuff Size: Normal)   Pulse 86   Temp 98.1 F (36.7 C) (Oral)   Wt 206 lb 9.6 oz (93.7 kg)   SpO2 99%   BMI 28.81 kg/m   Physical Exam  Constitutional: He is oriented to person, place, and time. He appears well-developed and well-nourished. No distress.  HENT:  Head: Normocephalic and atraumatic.  Right Ear: Hearing and external ear normal.  Left Ear: Hearing and external ear normal.  Nose: Nose normal.  Mouth/Throat: Oropharynx is  clear and moist.  No lesions or ulcerations identified.  Eyes: Conjunctivae and lids are normal. Right eye exhibits no discharge. Left eye exhibits no discharge. No scleral icterus.  Neck: Neck supple.  Cardiovascular: Normal rate and regular rhythm.  Pulmonary/Chest: Effort normal. No respiratory distress.  Musculoskeletal: Normal range of motion.  Lymphadenopathy:    He has no cervical adenopathy.  Neurological: He is alert and oriented to person, place, and time.  Skin: Skin is intact. No lesion and no rash noted.  Psychiatric: He has a normal mood and affect. His speech is normal and behavior is normal. Thought content normal.      Assessment & Plan:     1. Sore throat Has a tender/sore spot in the back of his throat, or behind the tongue, over the past 2 weeks. No known injury, fever, PND or congestion. No sign of ulcerations. Recommend saltwater gargles followed by Maalox with Benadryl gargle then swallow four times a day after meals and at bedtime. Recheck if no better in 4-6 days. May need ENT referral for endoscopic evaluation for lesion.      Dortha Kernennis Chrismon, PA  [facility]

## 2017-06-23 ENCOUNTER — Ambulatory Visit: Payer: Managed Care, Other (non HMO) | Attending: Nurse Practitioner | Admitting: Nurse Practitioner

## 2017-06-23 ENCOUNTER — Other Ambulatory Visit: Payer: Self-pay

## 2017-06-23 ENCOUNTER — Encounter: Payer: Self-pay | Admitting: Nurse Practitioner

## 2017-06-23 VITALS — BP 165/93 | HR 74 | Temp 97.8°F | Resp 16 | Ht 71.0 in | Wt 200.0 lb

## 2017-06-23 DIAGNOSIS — G629 Polyneuropathy, unspecified: Secondary | ICD-10-CM | POA: Insufficient documentation

## 2017-06-23 DIAGNOSIS — M545 Low back pain, unspecified: Secondary | ICD-10-CM

## 2017-06-23 DIAGNOSIS — R1032 Left lower quadrant pain: Secondary | ICD-10-CM | POA: Diagnosis not present

## 2017-06-23 DIAGNOSIS — Z79899 Other long term (current) drug therapy: Secondary | ICD-10-CM | POA: Insufficient documentation

## 2017-06-23 DIAGNOSIS — M792 Neuralgia and neuritis, unspecified: Secondary | ICD-10-CM | POA: Diagnosis not present

## 2017-06-23 DIAGNOSIS — G8929 Other chronic pain: Secondary | ICD-10-CM

## 2017-06-23 DIAGNOSIS — Z79891 Long term (current) use of opiate analgesic: Secondary | ICD-10-CM | POA: Insufficient documentation

## 2017-06-23 DIAGNOSIS — R52 Pain, unspecified: Secondary | ICD-10-CM

## 2017-06-23 DIAGNOSIS — N50812 Left testicular pain: Secondary | ICD-10-CM | POA: Diagnosis present

## 2017-06-23 DIAGNOSIS — R310 Gross hematuria: Secondary | ICD-10-CM | POA: Insufficient documentation

## 2017-06-23 DIAGNOSIS — G894 Chronic pain syndrome: Secondary | ICD-10-CM | POA: Diagnosis not present

## 2017-06-23 MED ORDER — GABAPENTIN 300 MG PO CAPS: 300.0000 mg | ORAL_CAPSULE | Freq: Three times a day (TID) | ORAL | 5 refills | Status: DC

## 2017-06-23 MED ORDER — TRAMADOL HCL 50 MG PO TABS: 50.0000 mg | ORAL_TABLET | Freq: Four times a day (QID) | ORAL | 5 refills | Status: DC | PRN

## 2017-06-23 NOTE — Patient Instructions (Addendum)
You have been given Rx for Tramadol to last for 6 months. Rx for gabapentin has been escribed to your pharmacy.____________________________________________________________________________________________  Medication Rules  Applies to: All patients receiving prescriptions (written or electronic).  Pharmacy of record: Pharmacy where electronic prescriptions will be sent. If written prescriptions are taken to a different pharmacy, please inform the nursing staff. The pharmacy listed in the electronic medical record should be the one where you would like electronic prescriptions to be sent.  Prescription refills: Only during scheduled appointments. Applies to both, written and electronic prescriptions.  NOTE: The following applies primarily to controlled substances (Opioid* Pain Medications).   Patient's responsibilities: 1. Pain Pills: Bring all pain pills to every appointment (except for procedure appointments). 2. Pill Bottles: Bring pills in original pharmacy bottle. Always bring newest bottle. Bring bottle, even if empty. 3. Medication refills: You are responsible for knowing and keeping track of what medications you need refilled. The day before your appointment, write a list of all prescriptions that need to be refilled. Bring that list to your appointment and give it to the admitting nurse. Prescriptions will be written only during appointments. If you forget a medication, it will not be "Called in", "Faxed", or "electronically sent". You will need to get another appointment to get these prescribed. 4. Prescription Accuracy: You are responsible for carefully inspecting your prescriptions before leaving our office. Have the discharge nurse carefully go over each prescription with you, before taking them home. Make sure that your name is accurately spelled, that your address is correct. Check the name and dose of your medication to make sure it is accurate. Check the number of pills, and the  written instructions to make sure they are clear and accurate. Make sure that you are given enough medication to last until your next medication refill appointment. 5. Taking Medication: Take medication as prescribed. Never take more pills than instructed. Never take medication more frequently than prescribed. Taking less pills or less frequently is permitted and encouraged, when it comes to controlled substances (written prescriptions).  6. Inform other Doctors: Always inform, all of your healthcare providers, of all the medications you take. 7. Pain Medication from other Providers: You are not allowed to accept any additional pain medication from any other Doctor or Healthcare provider. There are two exceptions to this rule. (see below) In the event that you require additional pain medication, you are responsible for notifying us, as stated below. 8. Medication Agreement: You are responsible for carefully reading and following our Medication Agreement. This must be signed before receiving any prescriptions from our practice. Safely store a copy of your signed Agreement. Violations to the Agreement will result in no further prescriptions. (Additional copies of our Medication Agreement are available upon request.) 9. Laws, Rules, & Regulations: All patients are expected to follow all [address] and [employer], Rules, [employer]. Ignorance of the Laws does not constitute a valid excuse. The use of any illegal substances is prohibited. 10. Adopted CDC guidelines & recommendations: Target dosing levels will be at or below 60 MME/day. Use of benzodiazepines** is not recommended.  Exceptions: There are only two exceptions to the rule of not receiving pain medications from other Healthcare Providers. 1. Exception #1 (Emergencies): In the event of an emergency (i.e.: accident requiring emergency care), you are allowed to receive additional pain medication. However, you are responsible for: As soon as you  are able, call our office [phone], at any time of the day or night, and leave  a message stating your name, the date and nature of the emergency, and the name and dose of the medication prescribed. In the event that your call is answered by a member of our staff, make sure to document and save the date, time, and the name of the person that took your information.  2. Exception #2 (Planned Surgery): In the event that you are scheduled by another doctor or dentist to have any type of surgery or procedure, you are allowed (for a period no longer than 30 days), to receive additional pain medication, for the acute post-op pain. However, in this case, you are responsible for picking up a copy of our "Post-op Pain Management for Surgeons" handout, and giving it to your surgeon or dentist. This document is available at our office, and does not require an appointment to obtain it. Simply go to our office during business hours (Monday-Thursday from 8:00 AM to 4:00 PM) (Friday 8:00 AM to 12:00 Noon) or if you have a scheduled appointment with [place], prior to your surgery, and ask for it by name. In addition, you will need to provide [place] with your name, name of your surgeon, type of surgery, and date of procedure or surgery.  *Opioid medications include: morphine, codeine, oxycodone, oxymorphone, hydrocodone, hydromorphone, meperidine, tramadol, tapentadol, buprenorphine, fentanyl, methadone. **Benzodiazepine medications include: diazepam (Valium), alprazolam (Xanax), clonazepam (Klonopine), lorazepam (Ativan), clorazepate (Tranxene), chlordiazepoxide (Librium), estazolam (Prosom), oxazepam (Serax), temazepam (Restoril), triazolam (Halcion) (Last updated: 03/31/2017) ____________________________________________________________________________________________

## 2017-06-23 NOTE — Progress Notes (Signed)
Patient's Name: Dylan [name]  MRN: [id number]  Referring Provider: Margo Common, PA  DOB: 16-Aug-1950  PCP: Margo Common, PA  DOS: 06/23/2017  Note by: Vevelyn Francois NP  Service setting: Ambulatory outpatient  Specialty: Interventional Pain Management  Location: [facility] (AMB) Pain Management Facility    Patient type: Established    Primary Reason(s) for Visit: Encounter for prescription drug management. (Level of risk: moderate)  CC: Testicle Pain (left side )  HPI  Dylan [name] is a 67 y.o. year old, male patient, who comes today for a medication management evaluation. 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 His primarily concern today is the Testicle Pain (left side )  Pain Assessment: Location: Left Scrotum Radiating: Denies  Onset: More than a month ago Duration: Chronic pain Quality: Aching, Dull, Tender Severity: 1 /10 (subjective, self-reported pain score)  Note: Reported level is compatible with observation.                          Effect on ADL: denies  Timing: Intermittent Modifying factors: Medications  BP: (!) 165/93  HR: 74  Dylan [name] was last scheduled for an appointment on 12/30/2016 for medication management. During today's appointment we reviewed Dylan [name]'s chronic pain status, as well as his outpatient medication regimen. He admits that his pain is stable. He admits that he is doing well. He denies any concerns today.   The patient  has no drug history on file. His body mass index is 27.89 kg/m.  Further details on both, my assessment(s), as well as the  proposed treatment plan, please see below.  Controlled Substance Pharmacotherapy Assessment REMS (Risk Evaluation and Mitigation Strategy)  Analgesic:Tramadol 50 mg every 6 hours (200 mg/day) MME/day:20 mg/day    Dylan Napoleon, RN  06/23/2017  8:33 AM  Sign at close encounter Safety precautions to be maintained throughout the outpatient stay will include: orient to surroundings, keep bed in low position, maintain call bell within reach at all times, provide assistance with transfer out of bed and ambulation.   Nursing Pain Medication Assessment:  Safety precautions to be maintained throughout the outpatient stay will include: orient to surroundings, keep bed in low position, maintain call bell within reach at all times, provide assistance with transfer out of bed and ambulation.   Medication Inspection Compliance: Pill count conducted under aseptic conditions, in front of the patient. Neither the pills nor the bottle was removed from the patient's sight at any time. Once count was completed pills were immediately returned to the patient in their original bottle.  Medication: Tramadol (Ultram) Pill/Patch Count: 16 of 120 pills remain Pill/Patch Appearance: Markings consistent with prescribed medication Bottle Appearance: Standard pharmacy container. Clearly labeled. Filled Date: 03 / 26 / 2019 Last Medication intake:  Today   Pharmacokinetics: Liberation and absorption (onset of action): WNL Distribution (time to peak effect): WNL Metabolism and excretion (duration of action): WNL         Pharmacodynamics: Desired effects: Analgesia: Dylan [name] reports >50% benefit. Functional ability: Patient reports that medication allows him to accomplish basic ADLs Clinically meaningful improvement in function (CMIF):  Sustained CMIF goals met Perceived effectiveness: Described as relatively effective, allowing for increase in activities of daily living (ADL) Undesirable  effects: Side-effects or Adverse reactions: None reported Monitoring: [place] PMP: Online review of the past 78-monthperiod conducted. Compliant with practice rules and regulations Last UDS on record: Summary  Date Value Ref Range Status  12/30/2016 FINAL  Final    Comment:    ==================================================================== TOXASSURE SELECT 13 (MW) ==================================================================== Test                             Result       Flag       Units Drug Present and Declared for Prescription Verification   Tramadol                       >8197        EXPECTED   ng/mg creat   O-Desmethyltramadol            8007         EXPECTED   ng/mg creat   N-Desmethyltramadol            782          EXPECTED   ng/mg creat    Source of tramadol is a prescription medication.    O-desmethyltramadol and N-desmethyltramadol are expected    metabolites of tramadol. ==================================================================== Test                      Result    Flag   Units      Ref Range   Creatinine              61               mg/dL      >=20 ==================================================================== Declared Medications:  The flagging and interpretation on this report are based on the  following declared medications.  Unexpected results may arise from  inaccuracies in the declared medications.  **Note: The testing scope of this panel includes these medications:  Tramadol  **Note: The testing scope of this panel does not include following  reported medications:  Gabapentin  Latanoprost ==================================================================== For clinical consultation, please call [phone] ====================================================================    UDS interpretation: Compliant          Medication Assessment Form: Reviewed. Patient indicates being compliant with therapy Treatment compliance:  Compliant Risk Assessment Profile: Aberrant behavior: See prior evaluations. None observed or detected today Comorbid factors increasing risk of overdose: See prior notes. No additional risks detected today Risk of substance use disorder (SUD): Low Opioid Risk Tool - 06/23/17 0831      Family History of Substance Abuse   Alcohol  Negative    Illegal Drugs  Negative    Rx Drugs  Negative      Personal History of Substance Abuse   Alcohol  Negative    Illegal Drugs  Negative    Rx Drugs  Negative      Age   Age between 154-45years   No      History of Preadolescent Sexual Abuse   History of Preadolescent Sexual Abuse  Negative or Male      Psychological Disease   Psychological Disease  Negative    Depression  Negative      Total Score   Opioid Risk Tool Scoring  0    Opioid Risk Interpretation  Low Risk  ORT Scoring interpretation table:  Score <3 = Low Risk for SUD  Score between 4-7 = Moderate Risk for SUD  Score >8 = High Risk for Opioid Abuse   Risk Mitigation Strategies:  Patient Counseling: Covered Patient-Prescriber Agreement (PPA): Present and active  Notification to other healthcare providers: Done  Pharmacologic Plan: No change in therapy, at this time.             Laboratory Chemistry  Inflammation Markers (CRP: Acute Phase) (ESR: Chronic Phase) Lab Results  Component Value Date   CRP 1.7 (H) 01/19/2016   ESRSEDRATE 13 01/19/2016                         Rheumatology Markers No results found for: RF, ANA, LABURIC, URICUR, LYMEIGGIGMAB, LYMEABIGMQN, HLAB27                      Renal Function Markers Lab Results  Component Value Date   BUN 15 01/19/2016   CREATININE 0.85 01/19/2016   GFRAA >60 01/19/2016   GFRNONAA >60 01/19/2016                              Hepatic Function Markers Lab Results  Component Value Date   AST 26 01/19/2016   ALT 19 01/19/2016   ALBUMIN 4.5 01/19/2016   ALKPHOS 62 01/19/2016   HCVAB NEGATIVE 02/28/2009                         Electrolytes Lab Results  Component Value Date   NA 136 01/19/2016   K 4.2 01/19/2016   CL 103 01/19/2016   CALCIUM 8.9 01/19/2016   MG 2.3 01/19/2016   PHOS 3.3 10/14/2011                        Neuropathy Markers Lab Results  Component Value Date   VITAMINB12 339 01/19/2016                        Bone Pathology Markers Lab Results  Component Value Date   VD25OH 22.3 (L) 01/23/2015   25OHVITD1 34 01/19/2016   25OHVITD2 1.2 01/19/2016   25OHVITD3 33 01/19/2016                         Coagulation Parameters Lab Results  Component Value Date   PLT 267 03/01/2009                        Cardiovascular Markers Lab Results  Component Value Date   HGB 12.1 (L) 03/01/2009   HCT 35.5 (L) 03/01/2009                         CA Markers No results found for: CEA, CA125, LABCA2                      Note: Lab results reviewed.  Recent Diagnostic Imaging Results  CT ABDOMEN PELVIS W WO CONTRAST CLINICAL DATA:  Gross hematuria  EXAM: CT ABDOMEN AND PELVIS WITHOUT AND WITH CONTRAST  TECHNIQUE: Multidetector CT imaging of the abdomen and pelvis was performed following the standard protocol before and following the bolus administration of intravenous contrast.  CONTRAST:  172m ISOVUE-300 IOPAMIDOL (ISOVUE-300) INJECTION  61%  COMPARISON:  CT 03/23/2008  FINDINGS: Lower chest: Lung bases are clear.  Hepatobiliary: No focal hepatic lesion. No biliary duct dilatation. Gallbladder is normal. Common bile duct is normal.  Pancreas: Pancreas is normal. No ductal dilatation. No pancreatic inflammation.  Spleen: Normal spleen  Adrenals/urinary tract: Adrenal glands are normal. No nephrolithiasis ureterolithiasis. No enhancing renal cortical lesion. No filling defects within collecting systems or ureters. No bladder calculi, enhancing bladder lesions, or filling defect within the bladder.  Stomach/Bowel: Stomach, small bowel, appendix, and cecum are  normal. The colon and rectosigmoid colon are normal.  Vascular/Lymphatic: Abdominal aorta is normal caliber with atherosclerotic calcification. There is no retroperitoneal or periportal lymphadenopathy. No pelvic lymphadenopathy.  Reproductive: Prostate normal  Other: No free fluid.  Musculoskeletal: No aggressive osseous lesion.  IMPRESSION: 1. No explanation for hematuria. No nephrolithiasis, ureterolithiasis, enhancing renal cortical lesion, or filling defects within the collecting systems. 2. No bladder stones or filling defects in the bladder which does not excluded a bladder lesion.  Electronically Signed   By: Suzy Bouchard M.D.   On: 11/26/2016 09:22  Complexity Note: Imaging results reviewed. Results shared with Mr. Wymer, using Layman's terms.                         Meds   Current Outpatient Medications:  .  gabapentin (NEURONTIN) 300 MG capsule, Take 1 capsule (300 mg total) by mouth every 8 (eight) hours., Disp: 90 capsule, Rfl: 5 .  latanoprost (XALATAN) 0.005 % ophthalmic solution, Place into both eyes daily. , Disp: , Rfl:  .  traMADol (ULTRAM) 50 MG tablet, Take 1 tablet (50 mg total) by mouth every 6 (six) hours as needed for severe pain., Disp: 120 tablet, Rfl: 5  ROS  Constitutional: Denies any fever or chills Gastrointestinal: No reported hemesis, hematochezia, vomiting, or acute GI distress Musculoskeletal: Denies any acute onset joint swelling, redness, loss of ROM, or weakness Neurological: No reported episodes of acute onset apraxia, aphasia, dysarthria, agnosia, amnesia, paralysis, loss of coordination, or loss of consciousness  Allergies  Mr. Goldner has No Known Allergies.  PFSH  Drug: Mr. Galicia  has no drug history on file. Alcohol:  reports that he drinks alcohol. Tobacco:  reports that he has never smoked. He has never used smokeless tobacco. 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 Surgical: Mr. Melroy  has a past surgical history that includes Back surgery (02/2009). Family: family history is not on file.  Constitutional Exam  General appearance: Well nourished, well developed, and well hydrated. In no apparent acute distress Vitals:   06/23/17 0824 06/23/17 0825  BP: (!) 173/96 (!) 165/93  Pulse: 74   Resp: 16   Temp: 97.8 F (36.6 C)   TempSrc: Oral   SpO2: 100%   Weight: 200 lb (90.7 kg)   Height: _0  (1.803 m)    BMI Assessment: Estimated body mass index is 27.89 kg/m as calculated from the following:   Height as of this encounter: _1  (1.803 m).   Weight as of this encounter: 200 lb (90.7 kg). Psych/Mental status: Alert, oriented x 3 (person, place, & time)       Eyes: PERLA Respiratory: No evidence of acute respiratory distress  Gait & Posture Assessment  Ambulation: Unassisted Gait: Relatively normal for age and body habitus Posture: WNL    Assessment  Primary Diagnosis & Pertinent Problem List:  The primary encounter diagnosis was Chronic groin pain (Left). Diagnoses of Chronic low back pain, Visceral pain, Chronic pain syndrome, and Neurogenic pain were also pertinent to this visit.  Status Diagnosis  Controlled Controlled Controlled 1. Chronic groin pain (Left)   2. Chronic low back pain   3. Visceral pain   4. Chronic pain syndrome   5. Neurogenic pain     Problems updated and reviewed during this visit: No problems updated. Plan of Care  Pharmacotherapy (Medications Ordered): Meds ordered this encounter  Medications  . traMADol (ULTRAM) 50 MG tablet    Sig: Take 1 tablet (50 mg total) by mouth every 6 (six) hours as needed for severe pain.    Dispense:  120 tablet    Refill:  5    Do not place this medication, or any other prescription from our practice, on "Automatic Refill". Patient may have prescription filled one day early if pharmacy is closed on scheduled refill  date. Refills to last for 30days    Order Specific Question:   Supervising Provider    Answer:   Milinda Pointer [phone]  . gabapentin (NEURONTIN) 300 MG capsule    Sig: Take 1 capsule (300 mg total) by mouth every 8 (eight) hours.    Dispense:  90 capsule    Refill:  5    Do not place this medication, or any other prescription from our practice, on "Automatic Refill". Patient may have prescription filled one day early if pharmacy is closed on scheduled refill date.    Order Specific Question:   Supervising Provider    Answer:   Milinda Pointer [[id number]]   New Prescriptions   No medications on file   Medications administered today: Gerritt Galentine. [place] "[name]" had no medications administered during this visit. Lab-work, procedure(s), and/or referral(s): No orders of the defined types were placed in this encounter.  Imaging and/or referral(s): None  Interventional therapies: Planned, scheduled, and/or pending:   Not at this time.   Considering:   Not at this time   Palliative PRN treatment(s):   Not at this time.       Provider-requested follow-up: Return in about 6 months (around 12/24/2017) for MedMgmt with Me Donella Stade [facility] Pace).  Future Appointments  Date Time Provider [place]  12/22/2017  8:30 AM Vevelyn Francois, NP [facility] None   Primary Care Physician: Margo Common, PA Location: [facility] Note by: Vevelyn Francois NP Date: 06/23/2017; Time: 9:12 AM  Pain Score Disclaimer: We use the NRS-11 scale. This is a self-reported, subjective measurement of pain severity with only modest accuracy. It is used primarily to identify changes within a particular patient. It must be understood that outpatient pain scales are significantly less accurate that those used for research, where they can be applied under ideal controlled circumstances with minimal exposure to variables. In reality, the score is likely to be a combination of  pain intensity and pain affect, where pain affect describes the degree of emotional arousal or changes in action readiness caused by the sensory experience of pain. Factors such as social and work situation, setting, emotional state, anxiety levels, expectation, and prior pain experience may influence pain perception and show large inter-individual differences that may also be affected by time variables.  Patient instructions provided during this appointment: Patient Instructions  You have been given Rx for Tramadol to last for 6 months. Rx for gabapentin has been escribed to your pharmacy.____________________________________________________________________________________________  Medication Rules  Applies to: All  patients receiving prescriptions (written or electronic).  Pharmacy of record: Pharmacy where electronic prescriptions will be sent. If written prescriptions are taken to a different pharmacy, please inform the nursing staff. The pharmacy listed in the electronic medical record should be the one where you would like electronic prescriptions to be sent.  Prescription refills: Only during scheduled appointments. Applies to both, written and electronic prescriptions.  NOTE: The following applies primarily to controlled substances (Opioid* Pain Medications).   Patient's responsibilities: 1. Pain Pills: Bring all pain pills to every appointment (except for procedure appointments). 2. Pill Bottles: Bring pills in original pharmacy bottle. Always bring newest bottle. Bring bottle, even if empty. 3. Medication refills: You are responsible for knowing and keeping track of what medications you need refilled. The day before your appointment, write a list of all prescriptions that need to be refilled. Bring that list to your appointment and give it to the admitting nurse. Prescriptions will be written only during appointments. If you forget a medication, it will not be "Called in", "Faxed", or  "electronically sent". You will need to get another appointment to get these prescribed. 4. Prescription Accuracy: You are responsible for carefully inspecting your prescriptions before leaving our office. Have the discharge nurse carefully go over each prescription with you, before taking them home. Make sure that your name is accurately spelled, that your address is correct. Check the name and dose of your medication to make sure it is accurate. Check the number of pills, and the written instructions to make sure they are clear and accurate. Make sure that you are given enough medication to last until your next medication refill appointment. 5. Taking Medication: Take medication as prescribed. Never take more pills than instructed. Never take medication more frequently than prescribed. Taking less pills or less frequently is permitted and encouraged, when it comes to controlled substances (written prescriptions).  6. Inform other Doctors: Always inform, all of your healthcare providers, of all the medications you take. 7. Pain Medication from other Providers: You are not allowed to accept any additional pain medication from any other Doctor or Healthcare provider. There are two exceptions to this rule. (see below) In the event that you require additional pain medication, you are responsible for notifying us, as stated below. 8. Medication Agreement: You are responsible for carefully reading and following our Medication Agreement. This must be signed before receiving any prescriptions from our practice. Safely store a copy of your signed Agreement. Violations to the Agreement will result in no further prescriptions. (Additional copies of our Medication Agreement are available upon request.) 9. Laws, Rules, & Regulations: All patients are expected to follow all Federal and [employer], [employer], Rules, [employer]. Ignorance of the Laws does not constitute a valid excuse. The use of any illegal substances is  prohibited. 10. Adopted CDC guidelines & recommendations: Target dosing levels will be at or below 60 MME/day. Use of benzodiazepines** is not recommended.  Exceptions: There are only two exceptions to the rule of not receiving pain medications from other Healthcare Providers. 1. Exception #1 (Emergencies): In the event of an emergency (i.e.: accident requiring emergency care), you are allowed to receive additional pain medication. However, you are responsible for: As soon as you are able, call our office [phone], at any time of the day or night, and leave a message stating your name, the date and nature of the emergency, and the name and dose of the medication prescribed. In the event that your call is  answered by a member of our staff, make sure to document and save the date, time, and the name of the person that took your information.  2. Exception #2 (Planned Surgery): In the event that you are scheduled by another doctor or dentist to have any type of surgery or procedure, you are allowed (for a period no longer than 30 days), to receive additional pain medication, for the acute post-op pain. However, in this case, you are responsible for picking up a copy of our "Post-op Pain Management for Surgeons" handout, and giving it to your surgeon or dentist. This document is available at our office, and does not require an appointment to obtain it. Simply go to our office during business hours (Monday-Thursday from 8:00 AM to 4:00 PM) (Friday 8:00 AM to 12:00 Noon) or if you have a scheduled appointment with [place], prior to your surgery, and ask for it by name. In addition, you will need to provide [place] with your name, name of your surgeon, type of surgery, and date of procedure or surgery.  *Opioid medications include: morphine, codeine, oxycodone, oxymorphone, hydrocodone, hydromorphone, meperidine, tramadol, tapentadol, buprenorphine, fentanyl, methadone. **Benzodiazepine medications include: diazepam  (Valium), alprazolam (Xanax), clonazepam (Klonopine), lorazepam (Ativan), clorazepate (Tranxene), chlordiazepoxide (Librium), estazolam (Prosom), oxazepam (Serax), temazepam (Restoril), triazolam (Halcion) (Last updated: 03/31/2017) ____________________________________________________________________________________________

## 2017-06-23 NOTE — Progress Notes (Signed)
Safety precautions to be maintained throughout the outpatient stay will include: orient to surroundings, keep bed in low position, maintain call bell within reach at all times, provide assistance with transfer out of bed and ambulation.   Nursing Pain Medication Assessment:  Safety precautions to be maintained throughout the outpatient stay will include: orient to surroundings, keep bed in low position, maintain call bell within reach at all times, provide assistance with transfer out of bed and ambulation.   Medication Inspection Compliance: Pill count conducted under aseptic conditions, in front of the patient. Neither the pills nor the bottle was removed from the patient's sight at any time. Once count was completed pills were immediately returned to the patient in their original bottle.  Medication: Tramadol (Ultram) Pill/Patch Count: 16 of 120 pills remain Pill/Patch Appearance: Markings consistent with prescribed medication Bottle Appearance: Standard pharmacy container. Clearly labeled. Filled Date: 03 / 26 / 2019 Last Medication intake:  Today

## 2017-12-22 ENCOUNTER — Other Ambulatory Visit: Payer: Self-pay

## 2017-12-22 ENCOUNTER — Encounter: Payer: Self-pay | Admitting: Nurse Practitioner

## 2017-12-22 ENCOUNTER — Ambulatory Visit: Payer: Managed Care, Other (non HMO) | Attending: Nurse Practitioner | Admitting: Nurse Practitioner

## 2017-12-22 VITALS — BP 148/101 | HR 61 | Temp 98.0°F | Ht 71.0 in | Wt 200.0 lb

## 2017-12-22 DIAGNOSIS — M545 Low back pain: Secondary | ICD-10-CM

## 2017-12-22 DIAGNOSIS — G894 Chronic pain syndrome: Secondary | ICD-10-CM | POA: Insufficient documentation

## 2017-12-22 DIAGNOSIS — N50819 Testicular pain, unspecified: Secondary | ICD-10-CM | POA: Diagnosis not present

## 2017-12-22 DIAGNOSIS — M792 Neuralgia and neuritis, unspecified: Secondary | ICD-10-CM | POA: Diagnosis not present

## 2017-12-22 DIAGNOSIS — N50812 Left testicular pain: Secondary | ICD-10-CM | POA: Diagnosis not present

## 2017-12-22 DIAGNOSIS — Z79891 Long term (current) use of opiate analgesic: Secondary | ICD-10-CM | POA: Insufficient documentation

## 2017-12-22 DIAGNOSIS — Z5181 Encounter for therapeutic drug level monitoring: Secondary | ICD-10-CM | POA: Insufficient documentation

## 2017-12-22 DIAGNOSIS — G8929 Other chronic pain: Secondary | ICD-10-CM

## 2017-12-22 MED ORDER — GABAPENTIN 300 MG PO CAPS: 300.0000 mg | ORAL_CAPSULE | Freq: Three times a day (TID) | ORAL | 5 refills | Status: DC

## 2017-12-22 MED ORDER — TRAMADOL HCL 50 MG PO TABS: 50.0000 mg | ORAL_TABLET | Freq: Four times a day (QID) | ORAL | 5 refills | Status: DC | PRN

## 2017-12-22 NOTE — Progress Notes (Signed)
Nursing Pain Medication Assessment:  Safety precautions to be maintained throughout the outpatient stay will include: orient to surroundings, keep bed in low position, maintain call bell within reach at all times, provide assistance with transfer out of bed and ambulation.  Medication Inspection Compliance: Pill count conducted under aseptic conditions, in front of the patient. Neither the pills nor the bottle was removed from the patient's sight at any time. Once count was completed pills were immediately returned to the patient in their original bottle.  Medication: Tramadol (Ultram) Pill/Patch Count: 98 of 120 pills remain Pill/Patch Appearance: Markings consistent with prescribed medication Bottle Appearance: Standard pharmacy container. Clearly labeled. Filled Date: 6 / 12 / 2019 Last Medication intake:  Today

## 2017-12-22 NOTE — Progress Notes (Signed)
Patient's Name: Dylan [name]  MRN: [id number]  Referring Provider: Margo Common, PA  DOB: 1950-07-18  PCP: Margo Common, PA  DOS: 12/22/2017  Note by: Vevelyn Francois NP  Service setting: Ambulatory outpatient  Specialty: Interventional Pain Management  Location: [facility] (AMB) Pain Management Facility    Patient type: Established    Primary Reason(s) for Visit: Encounter for prescription drug management. (Level of risk: moderate)  CC: Testicle Pain  HPI  Mr. Mogan is a 67 y.o. year old, male patient, who comes today for a medication management evaluation. 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 His primarily concern today is the Testicle Pain  Pain Assessment: Location: Left Scrotum Radiating: denies Onset: More than a month ago Duration: Chronic pain Quality: Dull, Aching, Sharp Severity: 1 /10 (subjective, self-reported pain score)  Note: Reported level is compatible with observation.                          Effect on ADL: none Timing: Intermittent Modifying factors: medications BP: (!) 148/101  HR: 61  Mr. Magel was last scheduled for an appointment on Visit date not found for medication management. During today's appointment we reviewed Mr. Sachdev's chronic pain status, as well as his outpatient medication regimen.  The patient  has no drug history on file. His body mass index is 27.89 kg/m.  Further details on both, my assessment(s), as well as the proposed treatment plan, please see below.  Controlled Substance Pharmacotherapy Assessment REMS (Risk Evaluation  and Mitigation Strategy)  Analgesic:Tramadol 50 mg every 6 hours (200 mg/day) MME/day:20 mg/day Chauncey Fischer, RN  12/22/2017  8:36 AM  Sign at close encounter Nursing Pain Medication Assessment:  Safety precautions to be maintained throughout the outpatient stay will include: orient to surroundings, keep bed in low position, maintain call bell within reach at all times, provide assistance with transfer out of bed and ambulation.  Medication Inspection Compliance: Pill count conducted under aseptic conditions, in front of the patient. Neither the pills nor the bottle was removed from the patient's sight at any time. Once count was completed pills were immediately returned to the patient in their original bottle.  Medication: Tramadol (Ultram) Pill/Patch Count: 98 of 120 pills remain Pill/Patch Appearance: Markings consistent with prescribed medication Bottle Appearance: Standard pharmacy container. Clearly labeled. Filled Date: 6 / 12 / 2019 Last Medication intake:  Today   Pharmacokinetics: Liberation and absorption (onset of action): WNL Distribution (time to peak effect): WNL Metabolism and excretion (duration of action): WNL         Pharmacodynamics: Desired effects: Analgesia: Mr. Marone reports >50% benefit. Functional ability: Patient reports that medication allows him to accomplish basic ADLs Clinically meaningful improvement in function (CMIF): Sustained CMIF goals met Perceived effectiveness: Described as relatively effective, allowing for increase in activities of daily living (ADL) Undesirable effects: Side-effects or Adverse reactions: None reported Monitoring: [place] PMP: Online review of the past 71-monthperiod conducted. Compliant with practice rules and regulations Last UDS on record: Summary  Date Value Ref Range Status  12/30/2016 FINAL  Final    Comment:    ====================================================================  TOXASSURE SELECT 13  (MW) ==================================================================== Test                             Result       Flag       Units Drug Present and Declared for Prescription Verification   Tramadol                       >8197        EXPECTED   ng/mg creat   O-Desmethyltramadol            8007         EXPECTED   ng/mg creat   N-Desmethyltramadol            782          EXPECTED   ng/mg creat    Source of tramadol is a prescription medication.    O-desmethyltramadol and N-desmethyltramadol are expected    metabolites of tramadol. ==================================================================== Test                      Result    Flag   Units      Ref Range   Creatinine              61               mg/dL      >=20 ==================================================================== Declared Medications:  The flagging and interpretation on this report are based on the  following declared medications.  Unexpected results may arise from  inaccuracies in the declared medications.  **Note: The testing scope of this panel includes these medications:  Tramadol  **Note: The testing scope of this panel does not include following  reported medications:  Gabapentin  Latanoprost ==================================================================== For clinical consultation, please call [phone]. ====================================================================    UDS interpretation: Compliant          Medication Assessment Form: Reviewed. Patient indicates being compliant with therapy Treatment compliance: Compliant Risk Assessment Profile: Aberrant behavior: See prior evaluations. None observed or detected today Comorbid factors increasing risk of overdose: See prior notes. No additional risks detected today Opioid risk tool (ORT) (Total Score): 0 Personal History of Substance Abuse (SUD-Substance use disorder):  Alcohol: Negative  Illegal Drugs: Negative  Rx Drugs:  Negative  ORT Risk Level calculation: Low Risk Risk of substance use disorder (SUD): Low Opioid Risk Tool - 12/22/17 0843      Family History of Substance Abuse   Alcohol  Negative    Illegal Drugs  Negative    Rx Drugs  Negative      Personal History of Substance Abuse   Alcohol  Negative    Illegal Drugs  Negative    Rx Drugs  Negative      Age   Age between 68-45 years   No      History of Preadolescent Sexual Abuse   History of Preadolescent Sexual Abuse  Negative or Male      Psychological Disease   Psychological Disease  Negative    Depression  Negative      Total Score   Opioid Risk Tool Scoring  0    Opioid Risk Interpretation  Low Risk      ORT Scoring interpretation table:  Score <3 = Low Risk for SUD  Score between 4-7 = Moderate Risk for SUD  Score >8 = High Risk for Opioid Abuse  Risk Mitigation Strategies:  Patient Counseling: Covered Patient-Prescriber Agreement (PPA): Present and active  Notification to other healthcare providers: Done  Pharmacologic Plan: No change in therapy, at this time.             Laboratory Chemistry  Inflammation Markers (CRP: Acute Phase) (ESR: Chronic Phase) Lab Results  Component Value Date   CRP 1.7 (H) 01/19/2016   ESRSEDRATE 13 01/19/2016                         Rheumatology Markers No results found for: RF, ANA, LABURIC, URICUR, LYMEIGGIGMAB, LYMEABIGMQN, HLAB27                      Renal Function Markers Lab Results  Component Value Date   BUN 15 01/19/2016   CREATININE 0.85 01/19/2016   GFRAA >60 01/19/2016   GFRNONAA >60 01/19/2016                             Hepatic Function Markers Lab Results  Component Value Date   AST 26 01/19/2016   ALT 19 01/19/2016   ALBUMIN 4.5 01/19/2016   ALKPHOS 62 01/19/2016   HCVAB NEGATIVE 02/28/2009                        Electrolytes Lab Results  Component Value Date   NA 136 01/19/2016   K 4.2 01/19/2016   CL 103 01/19/2016   CALCIUM 8.9 01/19/2016    MG 2.3 01/19/2016   PHOS 3.3 10/14/2011                        Neuropathy Markers Lab Results  Component Value Date   VITAMINB12 339 01/19/2016                        CNS Tests No results found for: COLORCSF, APPEARCSF, RBCCOUNTCSF, WBCCSF, POLYSCSF, LYMPHSCSF, EOSCSF, PROTEINCSF, GLUCCSF, JCVIRUS, CSFOLI, IGGCSF                      Bone Pathology Markers Lab Results  Component Value Date   VD25OH 22.3 (L) 01/23/2015   25OHVITD1 34 01/19/2016   25OHVITD2 1.2 01/19/2016   25OHVITD3 33 01/19/2016                         Coagulation Parameters Lab Results  Component Value Date   PLT 267 03/01/2009                        Cardiovascular Markers Lab Results  Component Value Date   HGB 12.1 (L) 03/01/2009   HCT 35.5 (L) 03/01/2009                         CA Markers No results found for: CEA, CA125, LABCA2                      Note: Lab results reviewed.  Recent Diagnostic Imaging Results  CT ABDOMEN PELVIS W WO CONTRAST CLINICAL DATA:  Gross hematuria  EXAM: CT ABDOMEN AND PELVIS WITHOUT AND WITH CONTRAST  TECHNIQUE: Multidetector CT imaging of the abdomen and pelvis was performed following the standard protocol before and following the bolus administration of intravenous contrast.  CONTRAST:  159m ISOVUE-300 IOPAMIDOL (ISOVUE-300) INJECTION 61%  COMPARISON:  CT 03/23/2008  FINDINGS: Lower chest: Lung bases are clear.  Hepatobiliary: No focal hepatic lesion. No biliary duct dilatation. Gallbladder is normal. Common bile duct is normal.  Pancreas: Pancreas is normal. No ductal dilatation. No pancreatic inflammation.  Spleen: Normal spleen  Adrenals/urinary tract: Adrenal glands are normal. No nephrolithiasis ureterolithiasis. No enhancing renal cortical lesion. No filling defects within collecting systems or ureters. No bladder calculi, enhancing bladder lesions, or filling defect within the bladder.  Stomach/Bowel: Stomach, small bowel, appendix,  and cecum are normal. The colon and rectosigmoid colon are normal.  Vascular/Lymphatic: Abdominal aorta is normal caliber with atherosclerotic calcification. There is no retroperitoneal or periportal lymphadenopathy. No pelvic lymphadenopathy.  Reproductive: Prostate normal  Other: No free fluid.  Musculoskeletal: No aggressive osseous lesion.  IMPRESSION: 1. No explanation for hematuria. No nephrolithiasis, ureterolithiasis, enhancing renal cortical lesion, or filling defects within the collecting systems. 2. No bladder stones or filling defects in the bladder which does not excluded a bladder lesion.  Electronically Signed   By: SSuzy BouchardM.D.   On: 11/26/2016 09:22  Complexity Note: Imaging results reviewed. Results shared with Mr. ICuevas using Layman's terms.                         Meds   Current Outpatient Medications:  .  gabapentin (NEURONTIN) 300 MG capsule, Take 1 capsule (300 mg total) by mouth every 8 (eight) hours., Disp: 90 capsule, Rfl: 5 .  latanoprost (XALATAN) 0.005 % ophthalmic solution, Place into both eyes daily. , Disp: , Rfl:  .  traMADol (ULTRAM) 50 MG tablet, Take 1 tablet (50 mg total) by mouth every 6 (six) hours as needed for severe pain., Disp: 120 tablet, Rfl: 5  ROS  Constitutional: Denies any fever or chills Gastrointestinal: No reported hemesis, hematochezia, vomiting, or acute GI distress Musculoskeletal: Denies any acute onset joint swelling, redness, loss of ROM, or weakness Neurological: No reported episodes of acute onset apraxia, aphasia, dysarthria, agnosia, amnesia, paralysis, loss of coordination, or loss of consciousness  Allergies  Mr. IParcellhas No Known Allergies.  PFSH  Drug: Mr. IMustin has no drug history on file. Alcohol:  reports that he drinks alcohol. Tobacco:  reports that he has never smoked. He has never used smokeless tobacco. 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 Surgical: Mr. IDepuy has a past surgical history that includes Back surgery (02/2009). Family: family history is not on file.  Constitutional Exam  General appearance: Well nourished, well developed, and well hydrated. In no apparent acute distress Vitals:   12/22/17 0836  BP: (!) 148/101  Pulse: 61  Temp: 98 F (36.7 C)  SpO2: 100%  Weight: 200 lb (90.7 kg)  Height: 5' 11"  (1.803 m)  Psych/Mental status: Alert, oriented x 3 (person, place, & time)       Eyes: PERLA Respiratory: No evidence of acute respiratory distress  Lumbar Spine Area Exam  Skin & Axial Inspection: No masses, redness, or swelling Alignment: Symmetrical Functional ROM: Unrestricted ROM       Stability: No instability detected Muscle Tone/Strength: Functionally intact. No obvious neuro-muscular anomalies detected. Sensory (Neurological): Unimpaired Palpation: No palpable anomalies         Gait & Posture Assessment  Ambulation: Unassisted Gait: Relatively normal for age and body habitus Posture: WNL   Lower Extremity Exam  Side: Right lower extremity  Side: Left lower extremity  Stability: No instability observed          Stability: No instability observed          Skin & Extremity Inspection: Skin color, temperature, and hair growth are WNL. No peripheral edema or cyanosis. No masses, redness, swelling, asymmetry, or associated skin lesions. No contractures.  Skin & Extremity Inspection: Skin color, temperature, and hair growth are WNL. No peripheral edema or cyanosis. No masses, redness, swelling, asymmetry, or associated skin lesions. No contractures.  Functional ROM: Unrestricted ROM                  Functional ROM: Unrestricted ROM                  Muscle Tone/Strength: Functionally intact. No obvious neuro-muscular anomalies detected.  Muscle Tone/Strength: Functionally intact. No obvious neuro-muscular anomalies detected.  Sensory  (Neurological): Unimpaired        Sensory (Neurological): Unimpaired                 Assessment  Primary Diagnosis & Pertinent Problem List: The primary encounter diagnosis was Testicular pain (Left). Diagnoses of Neurogenic pain, Chronic low back pain, Long term current use of opiate analgesic, and Chronic pain syndrome were also pertinent to this visit.  Status Diagnosis  Controlled Controlled Controlled 1. Testicular pain (Left)   2. Neurogenic pain   3. Chronic low back pain   4. Long term current use of opiate analgesic   5. Chronic pain syndrome     Problems updated and reviewed during this visit: No problems updated. Plan of Care  Pharmacotherapy (Medications Ordered): Meds ordered this encounter  Medications  . gabapentin (NEURONTIN) 300 MG capsule    Sig: Take 1 capsule (300 mg total) by mouth every 8 (eight) hours.    Dispense:  90 capsule    Refill:  5    Do not place this medication, or any other prescription from our practice, on "Automatic Refill". Patient may have prescription filled one day early if pharmacy is closed on scheduled refill date.    Order Specific Question:   Supervising Provider    Answer:   Milinda Pointer [phone]  . traMADol (ULTRAM) 50 MG tablet    Sig: Take 1 tablet (50 mg total) by mouth every 6 (six) hours as needed for severe pain.    Dispense:  120 tablet    Refill:  5    Do not place this medication, or any other prescription from our practice, on "Automatic Refill". Patient may have prescription filled one day early if pharmacy is closed on scheduled refill date.    Order Specific Question:   Supervising Provider    Answer:   Milinda Pointer [[id number]]   New Prescriptions   No medications on file   Medications administered today: Danthony Kendrix. Sokol "[name]" had no medications administered during this visit. Lab-work, procedure(s), and/or referral(s): No orders of the defined types were placed in this encounter.  Imaging and/or  referral(s): None  Interventional therapies: Planned, scheduled, and/or pending:   Not at this time.   Considering:   Not at this time.   Palliative PRN treatment(s):   Not at this time.    Provider-requested follow-up: Return in about 6 months (around 06/22/2018) for MedMgmt.  Future Appointments  Date Time Provider [place]  06/21/2018  8:30 AM Vevelyn Francois, NP [facility] None   Primary Care Physician: Margo Common, [place] Location:  [facility] Note by: Vevelyn Francois NP Date: 12/22/2017; Time: 9:00 AM  Pain Score Disclaimer: We use the NRS-11 scale. This is a self-reported, subjective measurement of pain severity with only modest accuracy. It is used primarily to identify changes within a particular patient. It must be understood that outpatient pain scales are significantly less accurate that those used for research, where they can be applied under ideal controlled circumstances with minimal exposure to variables. In reality, the score is likely to be a combination of pain intensity and pain affect, where pain affect describes the degree of emotional arousal or changes in action readiness caused by the sensory experience of pain. Factors such as social and work situation, setting, emotional state, anxiety levels, expectation, and prior pain experience may influence pain perception and show large inter-individual differences that may also be affected by time variables.  Patient instructions provided during this appointment: Patient Instructions   ____________________________________________________________________________________________  Medication Rules  Purpose: To inform patients, and their family members, of our rules and regulations.  Applies to: All patients receiving prescriptions (written or electronic).  Pharmacy of record: Pharmacy where electronic prescriptions will be sent. If written prescriptions are taken to a different  pharmacy, please inform the nursing staff. The pharmacy listed in the electronic medical record should be the one where you would like electronic prescriptions to be sent.  Electronic prescriptions: In compliance with the [place] (STOP) Act of 2017 (Session Lanny Cramp [phone]), effective February 01, 2018, all controlled substances must be electronically prescribed. Calling prescriptions to the pharmacy will cease to exist.  Prescription refills: Only during scheduled appointments. Applies to all prescriptions.  NOTE: The following applies primarily to controlled substances (Opioid* Pain Medications).   Patient's responsibilities: 1. Pain Pills: Bring all pain pills to every appointment (except for procedure appointments). 2. Pill Bottles: Bring pills in original pharmacy bottle. Always bring the newest bottle. Bring bottle, even if empty. 3. Medication refills: You are responsible for knowing and keeping track of what medications you take and those you need refilled. The day before your appointment: write a list of all prescriptions that need to be refilled. The day of the appointment: give the list to the admitting nurse. Prescriptions will be written only during appointments. If you forget a medication: it will not be "Called in", "Faxed", or "electronically sent". You will need to get another appointment to get these prescribed. No early refills. Do not call asking to have your prescription filled early. 4. Prescription Accuracy: You are responsible for carefully inspecting your prescriptions before leaving our office. Have the discharge nurse carefully go over each prescription with you, before taking them home. Make sure that your name is accurately spelled, that your address is correct. Check the name and dose of your medication to make sure it is accurate. Check the number of pills, and the written instructions to make sure they are clear and accurate.  Make sure that you are given enough medication to last until your next medication refill appointment. 5. Taking Medication: Take medication as prescribed. When it comes to controlled substances, taking less pills or less frequently than prescribed is permitted and encouraged. Never take more pills than instructed. Never take medication more frequently than prescribed.  6. Inform other Doctors: Always inform, all of your healthcare providers, of all the medications you take. 7. Pain Medication from other Providers: You are not allowed to accept any additional pain medication from any other Doctor or  Healthcare provider. There are two exceptions to this rule. (see below) In the event that you require additional pain medication, you are responsible for notifying us, as stated below. 8. Medication Agreement: You are responsible for carefully reading and following our Medication Agreement. This must be signed before receiving any prescriptions from our practice. Safely store a copy of your signed Agreement. Violations to the Agreement will result in no further prescriptions. (Additional copies of our Medication Agreement are available upon request.) 9. Laws, Rules, & Regulations: All patients are expected to follow all Federal and [employer], Rules, [employer]. Ignorance of the Laws does not constitute a valid excuse. The use of any illegal substances is prohibited. 10. Adopted CDC guidelines & recommendations: Target dosing levels will be at or below 60 MME/day. Use of benzodiazepines** is not recommended.  Exceptions: There are only two exceptions to the rule of not receiving pain medications from other Healthcare Providers. 1. Exception #1 (Emergencies): In the event of an emergency (i.e.: accident requiring emergency care), you are allowed to receive additional pain medication. However, you are responsible for: As soon as you are able, call our office [phone], at any time of the day or  night, and leave a message stating your name, the date and nature of the emergency, and the name and dose of the medication prescribed. In the event that your call is answered by a member of our staff, make sure to document and save the date, time, and the name of the person that took your information.  2. Exception #2 (Planned Surgery): In the event that you are scheduled by another doctor or dentist to have any type of surgery or procedure, you are allowed (for a period no longer than 30 days), to receive additional pain medication, for the acute post-op pain. However, in this case, you are responsible for picking up a copy of our "Post-op Pain Management for Surgeons" handout, and giving it to your surgeon or dentist. This document is available at our office, and does not require an appointment to obtain it. Simply go to our office during business hours (Monday-Thursday from 8:00 AM to 4:00 PM) (Friday 8:00 AM to 12:00 Noon) or if you have a scheduled appointment with [place], prior to your surgery, and ask for it by name. In addition, you will need to provide [place] with your name, name of your surgeon, type of surgery, and date of procedure or surgery.  *Opioid medications include: morphine, codeine, oxycodone, oxymorphone, hydrocodone, hydromorphone, meperidine, tramadol, tapentadol, buprenorphine, fentanyl, methadone. **Benzodiazepine medications include: diazepam (Valium), alprazolam (Xanax), clonazepam (Klonopine), lorazepam (Ativan), clorazepate (Tranxene), chlordiazepoxide (Librium), estazolam (Prosom), oxazepam (Serax), temazepam (Restoril), triazolam (Halcion) (Last updated: 03/31/2017) ____________________________________________________________________________________________   BMI Assessment: Estimated body mass index is 27.89 kg/m as calculated from the following:   Height as of this encounter: 5' 11"  (1.803 m).   Weight as of this encounter: 200 lb (90.7 kg).  BMI interpretation table: BMI  level Category Range association with higher incidence of chronic pain  <18 kg/m2 Underweight   18.5-24.9 kg/m2 Ideal body weight   25-29.9 kg/m2 Overweight Increased incidence by 20%  30-34.9 kg/m2 Obese (Class I) Increased incidence by 68%  35-39.9 kg/m2 Severe obesity (Class II) Increased incidence by 136%  >40 kg/m2 Extreme obesity (Class III) Increased incidence by 254%   Patient's current BMI Ideal Body weight  Body mass index is 27.89 kg/m. Ideal body weight: 75.3 kg (166 lb 0.1 oz) Adjusted ideal body weight: 81.5 kg (179  lb 9.7 oz)   BMI Readings from Last 4 Encounters:  12/22/17 27.89 kg/m  06/23/17 27.89 kg/m  02/07/17 28.81 kg/m  12/30/16 27.89 kg/m   Wt Readings from Last 4 Encounters:  12/22/17 200 lb (90.7 kg)  06/23/17 200 lb (90.7 kg)  02/07/17 206 lb 9.6 oz (93.7 kg)  12/30/16 200 lb (90.7 kg)

## 2017-12-22 NOTE — Patient Instructions (Addendum)
____________________________________________________________________________________________  Medication Rules  Purpose: To inform patients, and their family members, of our rules and regulations.  Applies to: All patients receiving prescriptions (written or electronic).  Pharmacy of record: Pharmacy where electronic prescriptions will be sent. If written prescriptions are taken to a different pharmacy, please inform the nursing staff. The pharmacy listed in the electronic medical record should be the one where you would like electronic prescriptions to be sent.  Electronic prescriptions: In compliance with the [place] Strengthen Opioid Misuse Prevention (STOP) Act of 2017 (Session Law 2017-74/H243), effective February 01, 2018, all controlled substances must be electronically prescribed. Calling prescriptions to the pharmacy will cease to exist.  Prescription refills: Only during scheduled appointments. Applies to all prescriptions.  NOTE: The following applies primarily to controlled substances (Opioid* Pain Medications).   Patient's responsibilities: 1. Pain Pills: Bring all pain pills to every appointment (except for procedure appointments). 2. Pill Bottles: Bring pills in original pharmacy bottle. Always bring the newest bottle. Bring bottle, even if empty. 3. Medication refills: You are responsible for knowing and keeping track of what medications you take and those you need refilled. The day before your appointment: write a list of all prescriptions that need to be refilled. The day of the appointment: give the list to the admitting nurse. Prescriptions will be written only during appointments. If you forget a medication: it will not be "Called in", "Faxed", or "electronically sent". You will need to get another appointment to get these prescribed. No early refills. Do not call asking to have your prescription filled early. 4. Prescription Accuracy: You are responsible for  carefully inspecting your prescriptions before leaving our office. Have the discharge nurse carefully go over each prescription with you, before taking them home. Make sure that your name is accurately spelled, that your address is correct. Check the name and dose of your medication to make sure it is accurate. Check the number of pills, and the written instructions to make sure they are clear and accurate. Make sure that you are given enough medication to last until your next medication refill appointment. 5. Taking Medication: Take medication as prescribed. When it comes to controlled substances, taking less pills or less frequently than prescribed is permitted and encouraged. Never take more pills than instructed. Never take medication more frequently than prescribed.  6. Inform other Doctors: Always inform, all of your healthcare providers, of all the medications you take. 7. Pain Medication from other Providers: You are not allowed to accept any additional pain medication from any other Doctor or Healthcare provider. There are two exceptions to this rule. (see below) In the event that you require additional pain medication, you are responsible for notifying us, as stated below. 8. Medication Agreement: You are responsible for carefully reading and following our Medication Agreement. This must be signed before receiving any prescriptions from our practice. Safely store a copy of your signed Agreement. Violations to the Agreement will result in no further prescriptions. (Additional copies of our Medication Agreement are available upon request.) 9. Laws, Rules, & Regulations: All patients are expected to follow all Federal and State Laws, Statutes, Rules, & Regulations. Ignorance of the Laws does not constitute a valid excuse. The use of any illegal substances is prohibited. 10. Adopted CDC guidelines & recommendations: Target dosing levels will be at or below 60 MME/day. Use of benzodiazepines** is not  recommended.  Exceptions: There are only two exceptions to the rule of not receiving pain medications from other Healthcare Providers. 1.   Exception #1 (Emergencies): In the event of an emergency (i.e.: accident requiring emergency care), you are allowed to receive additional pain medication. However, you are responsible for: As soon as you are able, call our office [phone], at any time of the day or night, and leave a message stating your name, the date and nature of the emergency, and the name and dose of the medication prescribed. In the event that your call is answered by a member of our staff, make sure to document and save the date, time, and the name of the person that took your information.  2. Exception #2 (Planned Surgery): In the event that you are scheduled by another doctor or dentist to have any type of surgery or procedure, you are allowed (for a period no longer than 30 days), to receive additional pain medication, for the acute post-op pain. However, in this case, you are responsible for picking up a copy of our "Post-op Pain Management for Surgeons" handout, and giving it to your surgeon or dentist. This document is available at our office, and does not require an appointment to obtain it. Simply go to our office during business hours (Monday-Thursday from 8:00 AM to 4:00 PM) (Friday 8:00 AM to 12:00 Noon) or if you have a scheduled appointment with us, prior to your surgery, and ask for it by name. In addition, you will need to provide us with your name, name of your surgeon, type of surgery, and date of procedure or surgery.  *Opioid medications include: morphine, codeine, oxycodone, oxymorphone, hydrocodone, hydromorphone, meperidine, tramadol, tapentadol, buprenorphine, fentanyl, methadone. **Benzodiazepine medications include: diazepam (Valium), alprazolam (Xanax), clonazepam (Klonopine), lorazepam (Ativan), clorazepate (Tranxene), chlordiazepoxide (Librium), estazolam (Prosom),  oxazepam (Serax), temazepam (Restoril), triazolam (Halcion) (Last updated: 03/31/2017) ____________________________________________________________________________________________   BMI Assessment: Estimated body mass index is 27.89 kg/m as calculated from the following:   Height as of this encounter: 5\' 11"  (1.803 m).   Weight as of this encounter: 200 lb (90.7 kg).  BMI interpretation table: BMI level Category Range association with higher incidence of chronic pain  <18 kg/m2 Underweight   18.5-24.9 kg/m2 Ideal body weight   25-29.9 kg/m2 Overweight Increased incidence by 20%  30-34.9 kg/m2 Obese (Class I) Increased incidence by 68%  35-39.9 kg/m2 Severe obesity (Class II) Increased incidence by 136%  >40 kg/m2 Extreme obesity (Class III) Increased incidence by 254%   Patient's current BMI Ideal Body weight  Body mass index is 27.89 kg/m. Ideal body weight: 75.3 kg (166 lb 0.1 oz) Adjusted ideal body weight: 81.5 kg (179 lb 9.7 oz)   BMI Readings from Last 4 Encounters:  12/22/17 27.89 kg/m  06/23/17 27.89 kg/m  02/07/17 28.81 kg/m  12/30/16 27.89 kg/m   Wt Readings from Last 4 Encounters:  12/22/17 200 lb (90.7 kg)  06/23/17 200 lb (90.7 kg)  02/07/17 206 lb 9.6 oz (93.7 kg)  12/30/16 200 lb (90.7 kg)

## 2018-06-21 ENCOUNTER — Ambulatory Visit: Payer: Managed Care, Other (non HMO) | Attending: Nurse Practitioner | Admitting: Nurse Practitioner

## 2018-06-21 ENCOUNTER — Other Ambulatory Visit: Payer: Self-pay

## 2018-06-21 DIAGNOSIS — G894 Chronic pain syndrome: Secondary | ICD-10-CM | POA: Diagnosis not present

## 2018-06-21 DIAGNOSIS — M792 Neuralgia and neuritis, unspecified: Secondary | ICD-10-CM

## 2018-06-21 DIAGNOSIS — R1032 Left lower quadrant pain: Secondary | ICD-10-CM

## 2018-06-21 DIAGNOSIS — R52 Pain, unspecified: Secondary | ICD-10-CM

## 2018-06-21 DIAGNOSIS — G8929 Other chronic pain: Secondary | ICD-10-CM

## 2018-06-21 DIAGNOSIS — N50819 Testicular pain, unspecified: Secondary | ICD-10-CM | POA: Diagnosis not present

## 2018-06-21 MED ORDER — TRAMADOL HCL 50 MG PO TABS: 50.0000 mg | ORAL_TABLET | Freq: Four times a day (QID) | ORAL | 5 refills | Status: DC | PRN

## 2018-06-21 MED ORDER — GABAPENTIN 300 MG PO CAPS
300.0000 mg | ORAL_CAPSULE | Freq: Three times a day (TID) | ORAL | 5 refills | Status: DC
Start: 2018-07-01 — End: 2018-12-06

## 2018-06-21 NOTE — Patient Instructions (Signed)
____________________________________________________________________________________________  Medication Rules  Purpose: To inform patients, and their family members, of our rules and regulations.  Applies to: All patients receiving prescriptions (written or electronic).  Pharmacy of record: Pharmacy where electronic prescriptions will be sent. If written prescriptions are taken to a different pharmacy, please inform the nursing staff. The pharmacy listed in the electronic medical record should be the one where you would like electronic prescriptions to be sent.  Electronic prescriptions: In compliance with the [place] Strengthen Opioid Misuse Prevention (STOP) Act of 2017 (Session Law 2017-74/H243), effective February 01, 2018, all controlled substances must be electronically prescribed. Calling prescriptions to the pharmacy will cease to exist.  Prescription refills: Only during scheduled appointments. Applies to all prescriptions.  NOTE: The following applies primarily to controlled substances (Opioid* Pain Medications).   Patient's responsibilities: 1. Pain Pills: Bring all pain pills to every appointment (except for procedure appointments). 2. Pill Bottles: Bring pills in original pharmacy bottle. Always bring the newest bottle. Bring bottle, even if empty. 3. Medication refills: You are responsible for knowing and keeping track of what medications you take and those you need refilled. The day before your appointment: write a list of all prescriptions that need to be refilled. The day of the appointment: give the list to the admitting nurse. Prescriptions will be written only during appointments. No prescriptions will be written on procedure days. If you forget a medication: it will not be "Called in", "Faxed", or "electronically sent". You will need to get another appointment to get these prescribed. No early refills. Do not call asking to have your prescription filled  early. 4. Prescription Accuracy: You are responsible for carefully inspecting your prescriptions before leaving our office. Have the discharge nurse carefully go over each prescription with you, before taking them home. Make sure that your name is accurately spelled, that your address is correct. Check the name and dose of your medication to make sure it is accurate. Check the number of pills, and the written instructions to make sure they are clear and accurate. Make sure that you are given enough medication to last until your next medication refill appointment. 5. Taking Medication: Take medication as prescribed. When it comes to controlled substances, taking less pills or less frequently than prescribed is permitted and encouraged. Never take more pills than instructed. Never take medication more frequently than prescribed.  6. Inform other Doctors: Always inform, all of your healthcare providers, of all the medications you take. 7. Pain Medication from other Providers: You are not allowed to accept any additional pain medication from any other Doctor or Healthcare provider. There are two exceptions to this rule. (see below) In the event that you require additional pain medication, you are responsible for notifying us, as stated below. 8. Medication Agreement: You are responsible for carefully reading and following our Medication Agreement. This must be signed before receiving any prescriptions from our practice. Safely store a copy of your signed Agreement. Violations to the Agreement will result in no further prescriptions. (Additional copies of our Medication Agreement are available upon request.) 9. Laws, Rules, & Regulations: All patients are expected to follow all Federal and State Laws, Statutes, Rules, & Regulations. Ignorance of the Laws does not constitute a valid excuse. The use of any illegal substances is prohibited. 10. Adopted CDC guidelines & recommendations: Target dosing levels will be  at or below 60 MME/day. Use of benzodiazepines** is not recommended.  Exceptions: There are only two exceptions to the rule of not   receiving pain medications from other Healthcare Providers. 1. Exception #1 (Emergencies): In the event of an emergency (i.e.: accident requiring emergency care), you are allowed to receive additional pain medication. However, you are responsible for: As soon as you are able, call our office [phone], at any time of the day or night, and leave a message stating your name, the date and nature of the emergency, and the name and dose of the medication prescribed. In the event that your call is answered by a member of our staff, make sure to document and save the date, time, and the name of the person that took your information.  2. Exception #2 (Planned Surgery): In the event that you are scheduled by another doctor or dentist to have any type of surgery or procedure, you are allowed (for a period no longer than 30 days), to receive additional pain medication, for the acute post-op pain. However, in this case, you are responsible for picking up a copy of our "Post-op Pain Management for Surgeons" handout, and giving it to your surgeon or dentist. This document is available at our office, and does not require an appointment to obtain it. Simply go to our office during business hours (Monday-Thursday from 8:00 AM to 4:00 PM) (Friday 8:00 AM to 12:00 Noon) or if you have a scheduled appointment with us, prior to your surgery, and ask for it by name. In addition, you will need to provide us with your name, name of your surgeon, type of surgery, and date of procedure or surgery.  *Opioid medications include: morphine, codeine, oxycodone, oxymorphone, hydrocodone, hydromorphone, meperidine, tramadol, tapentadol, buprenorphine, fentanyl, methadone. **Benzodiazepine medications include: diazepam (Valium), alprazolam (Xanax), clonazepam (Klonopine), lorazepam (Ativan), clorazepate  (Tranxene), chlordiazepoxide (Librium), estazolam (Prosom), oxazepam (Serax), temazepam (Restoril), triazolam (Halcion) (Last updated: 03/31/2017) ____________________________________________________________________________________________    

## 2018-06-21 NOTE — Progress Notes (Signed)
Pain Management Encounter Note - Virtual Visit via Telephone Telehealth (real-time audio visits between healthcare provider and patient).  Patient's Phone No. & Preferred Pharmacy:  [phone] (home); There is no such number on file (mobile).; (Preferred) [phone]  [facility] [place], [place] - [address] [place] [address] Phone: [phone] Fax: [phone]  [facility] [place], [place] - [address] [place] [address] Phone: [phone] Fax: [phone]   Pre-screening note:  Our staff contacted [name] and offered him an "in person", "face-to-face" appointment versus a telephone encounter. He indicated preferring the telephone encounter, at this time.  Reason for Virtual Visit: COVID-19*  Social distancing based on CDC and AMA recommendations.   I contacted Dylan [name] on 06/21/2018 at 8:30 AM by telephone and clearly identified myself as Thad Rangerrystal Oluwatobi Visser, NP. I verified that I was speaking with the correct person using two identifiers (Name and date of birth: Nov 09, 1950). There was an initial call made to his home phone a message was left. I then called his office/work phone and was able to make contact.   Advanced Informed Consent I sought verbal advanced consent from Dylan [name] for telemedicine interactions and virtual visit. I informed [name] of the security and privacy concerns, risks, and limitations associated with performing an evaluation and management service by telephone. I also informed [name] of the availability of "in person" appointments and I informed him of the possibility of a patient responsible charge related to this service. [name] expressed understanding and agreed to proceed.   Historic Elements   Dylan [name] is a 68 y.o. year old, male patient evaluated today after his last encounter by our practice on Visit date not found. 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 He also  has a past surgical history that includes Back surgery (02/2009). [name] has a current medication list which includes the following prescription(s): gabapentin, latanoprost, and tramadol. He  reports that he has never smoked. He has never used smokeless tobacco. He reports current alcohol use. No history on file for drug. [name] has No Known Allergies.   HPI  I last saw him on Visit date not found. He is being evaluated for medication management. He has 2/10 scrotum pain . He denies changes with is pain. He denies any  new concerns . He denies any side effects of his current medication . He denies any changes in any medical conditions. He did have a change insurance.   Pharmacotherapy Assessment  Analgesic:Tramadol 50 mg every 6 hours (200 mg/day) MME/day:20 mg/day  Monitoring: Pharmacotherapy: No side-effects or adverse reactions reported. [place] PMP: PDMP reviewed during this encounter.       Compliance: No problems identified. Plan: Refer to "POC".  Review of recent tests  CT ABDOMEN PELVIS W WO CONTRAST CLINICAL DATA:  Gross hematuria  EXAM: CT ABDOMEN AND PELVIS WITHOUT AND WITH CONTRAST  TECHNIQUE: Multidetector CT imaging of the abdomen and pelvis was performed following the standard protocol before and following the bolus administration of intravenous contrast.  CONTRAST:  125mL ISOVUE-300 IOPAMIDOL (ISOVUE-300) INJECTION 61%  COMPARISON:  CT 03/23/2008  FINDINGS: Lower chest: Lung bases are clear.  Hepatobiliary: No focal hepatic lesion. No biliary duct dilatation. Gallbladder is normal. Common bile duct is normal.  Pancreas: Pancreas is normal. No ductal dilatation. No pancreatic inflammation.  Spleen: Normal  spleen  Adrenals/urinary tract: Adrenal glands are normal. No nephrolithiasis ureterolithiasis. No enhancing renal cortical lesion. No filling  defects within collecting systems or ureters. No bladder calculi, enhancing bladder lesions, or filling defect within the bladder.  Stomach/Bowel: Stomach, small bowel, appendix, and cecum are normal. The colon and rectosigmoid colon are normal.  Vascular/Lymphatic: Abdominal aorta is normal caliber with atherosclerotic calcification. There is no retroperitoneal or periportal lymphadenopathy. No pelvic lymphadenopathy.  Reproductive: Prostate normal  Other: No free fluid.  Musculoskeletal: No aggressive osseous lesion.  IMPRESSION: 1. No explanation for hematuria. No nephrolithiasis, ureterolithiasis, enhancing renal cortical lesion, or filling defects within the collecting systems. 2. No bladder stones or filling defects in the bladder which does not excluded a bladder lesion.  Electronically Signed   By: Genevive Bi M.D.   On: 11/26/2016 09:22   Clinical Support on 12/30/2016  Component Date Value Ref Range Status  . Summary 12/30/2016 FINAL   Final   Comment: ==================================================================== TOXASSURE SELECT 13 (MW) ==================================================================== Test                             Result       Flag       Units Drug Present and Declared for Prescription Verification   Tramadol                       >8197        EXPECTED   ng/mg creat   O-Desmethyltramadol            8007         EXPECTED   ng/mg creat   N-Desmethyltramadol            782          EXPECTED   ng/mg creat    Source of tramadol is a prescription medication.    O-desmethyltramadol and N-desmethyltramadol are expected    metabolites of tramadol. ==================================================================== Test                      Result    Flag   Units      Ref Range   Creatinine              61               mg/dL      >=04 ==================================================================== Declared Medications:  The  flagging and interpretation on this report are based on the  follo                          wing declared medications.  Unexpected results may arise from  inaccuracies in the declared medications.  **Note: The testing scope of this panel includes these medications:  Tramadol  **Note: The testing scope of this panel does not include following  reported medications:  Gabapentin  Latanoprost ==================================================================== For clinical consultation, please call [phone]. ====================================================================    Assessment  The primary encounter diagnosis was Testicular pain (Left). Diagnoses of Neurogenic pain, Chronic pain syndrome, Chronic groin pain (Left), and Visceral pain were also pertinent to this visit.  Plan of Care  I am having Dylan Ramsay. Cervantez "Dylan [name]" maintain his latanoprost, gabapentin, and traMADol.  Pharmacotherapy (Medications Ordered): Meds ordered this encounter  Medications  . gabapentin (NEURONTIN) 300 MG capsule    Sig: Take 1 capsule (300 mg total) by mouth every 8 (eight)  hours.    Dispense:  90 capsule    Refill:  5    Do not place this medication, or any other prescription from our practice, on "Automatic Refill". Patient may have prescription filled one day early if pharmacy is closed on scheduled refill date.    Order Specific Question:   Supervising Provider    Answer:   Delano Metz [phone]  . traMADol (ULTRAM) 50 MG tablet    Sig: Take 1 tablet (50 mg total) by mouth every 6 (six) hours as needed for severe pain.    Dispense:  120 tablet    Refill:  5    Do not place this medication, or any other prescription from our practice, on "Automatic Refill". Patient may have prescription filled one day early if pharmacy is closed on scheduled refill date.    Order Specific Question:   Supervising Provider    Answer:   Delano Metz [phone]   Orders:  No orders of the  defined types were placed in this encounter.  Follow-up plan:   Return in about 6 months (around 12/22/2018) for MedMgmt.   I discussed the assessment and treatment plan with the patient. The patient was provided an opportunity to ask questions and all were answered. The patient agreed with the plan and demonstrated an understanding of the instructions.  Patient advised to call back or seek an in-person evaluation if the symptoms or condition worsens.  Total duration of non-face-to-face encounter: 12 minutes.  Note by: Thad Ranger, NP Date: 06/21/2018; Time: 8:46 AM  Disclaimer:  * Given the special circumstances of the COVID-19 pandemic, the federal government has announced that the Office for Civil Rights (OCR) will exercise its enforcement discretion and will not impose penalties on physicians using telehealth in the event of noncompliance with regulatory requirements under the [employer] Portability and Accountability Act (HIPAA) in connection with the good faith provision of telehealth during the COVID-19 national public health emergency. (AMA)

## 2018-07-07 IMAGING — CT CT ABD-PEL WO/W CM
3 of 6 series · 14 of 32 positions shown, 19 images · IV contrast (APPLIED)
Comparison: CT 03/23/2008

CLINICAL DATA: Gross hematuria

EXAM:
CT ABDOMEN AND PELVIS WITHOUT AND WITH CONTRAST
TECHNIQUE: Multidetector CT imaging of the abdomen and pelvis was performed
following the standard protocol before and following the bolus
administration of intravenous contrast.
CONTRAST:  125mL 8IDJV4-X11 IOPAMIDOL (8IDJV4-X11) INJECTION 61%

[Series 2: axial pre · axial · non-contrast · 0.74mm/px · z∈[-936,-851]mm · 2 of 105 slices shown]
[im 18/105  soft-tissue]
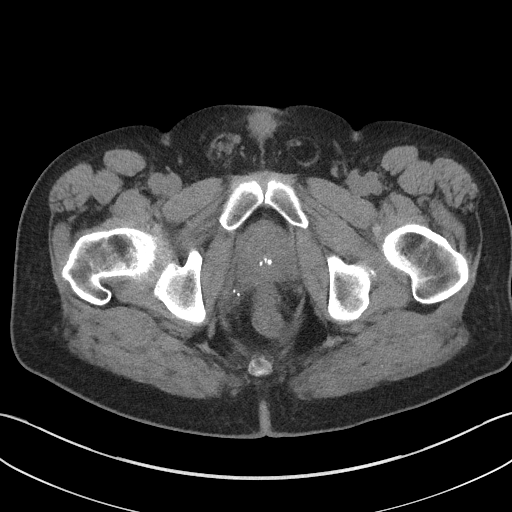
[im 35/105  soft-tissue]
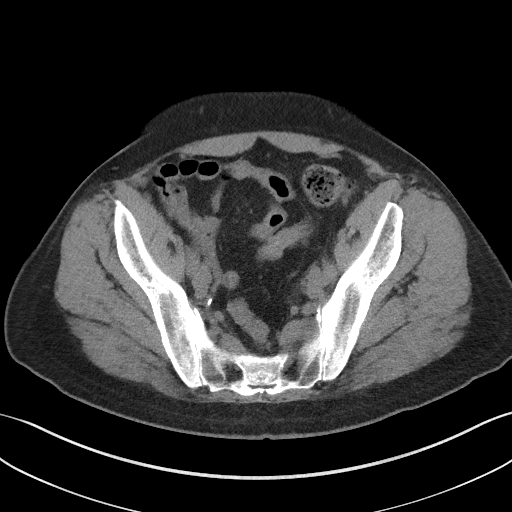

[Series 4: axial post · axial · 0.74mm/px · z∈[-951,-576]mm · 6 of 105 slices shown]
[im 15/105  soft-tissue]
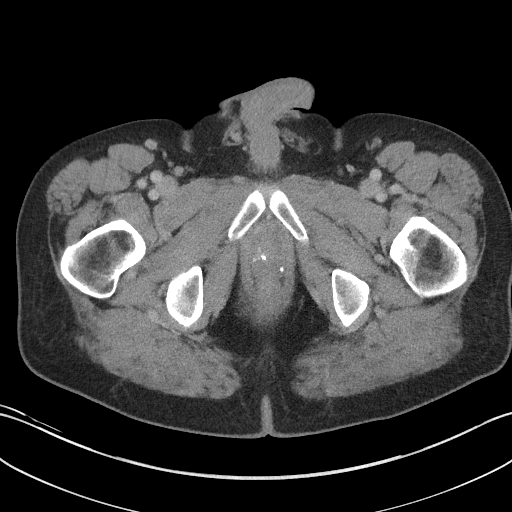
[im 30/105  soft-tissue]
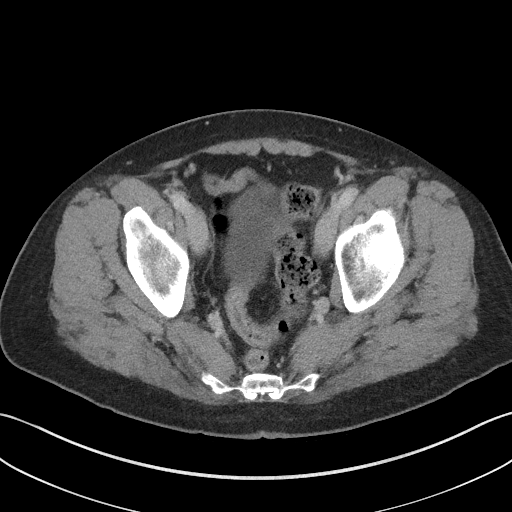
[im 45/105  soft-tissue]
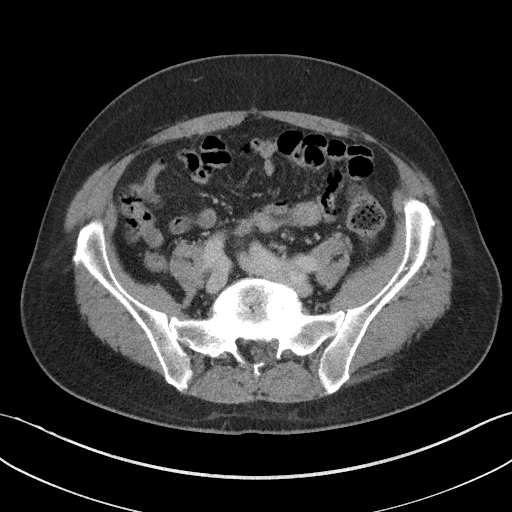
[im 60/105  soft-tissue]
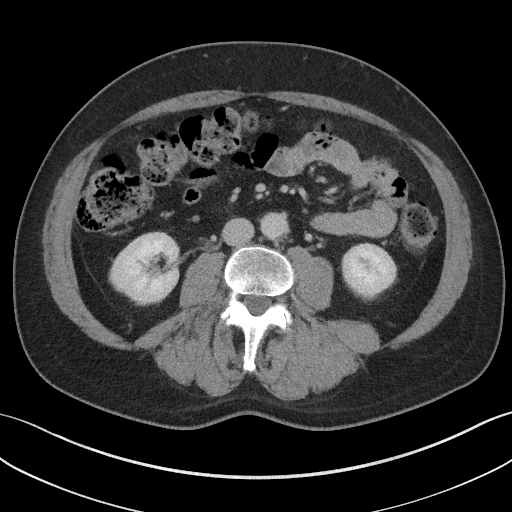
[im 75/105  soft-tissue]
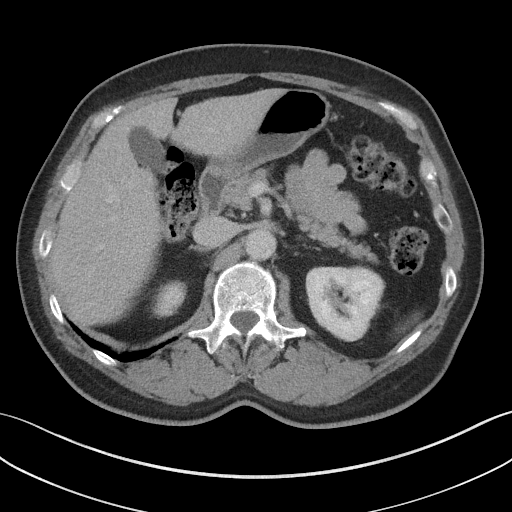
[im 90/105  soft-tissue]
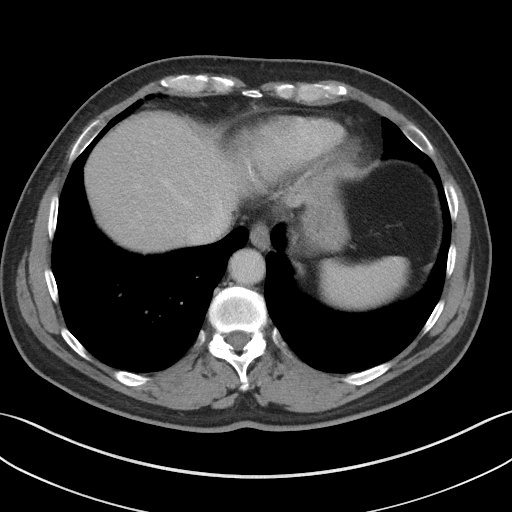

[Series 13: axial delay · axial · delayed · 0.74mm/px · z∈[-956,-566]mm · 6 of 110 slices shown, 11 images]
[im 16/110  soft-tissue]
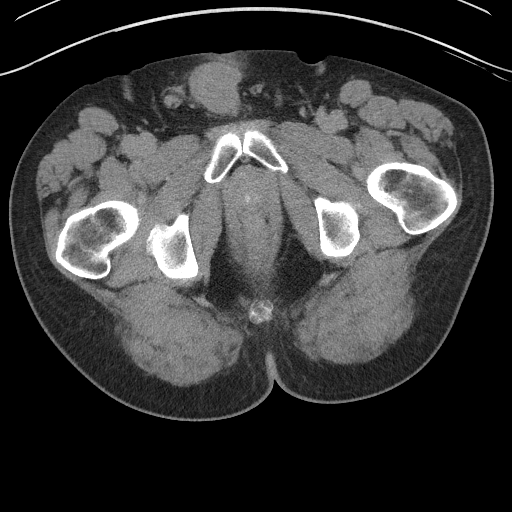
[im 16/110  bone]
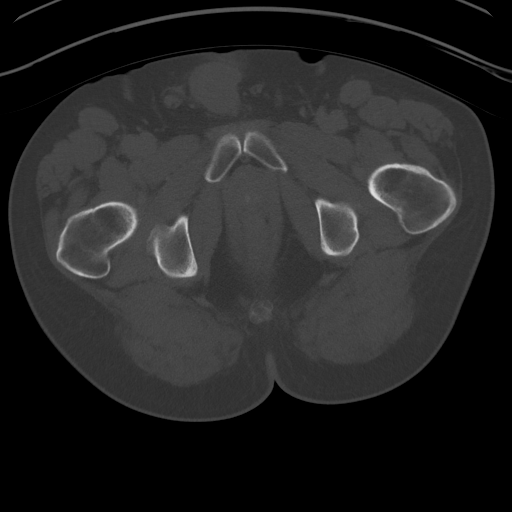
[im 32/110  soft-tissue]
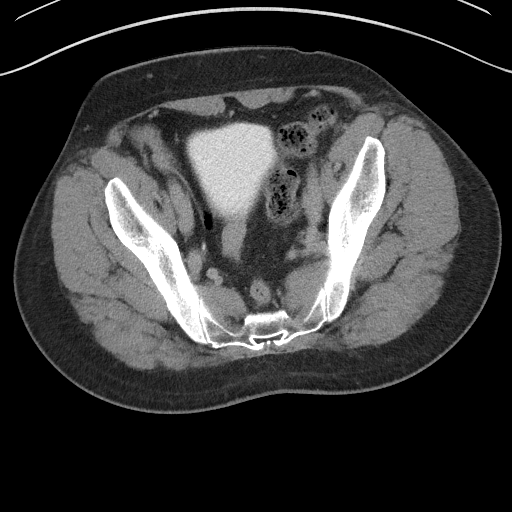
[im 47/110  soft-tissue]
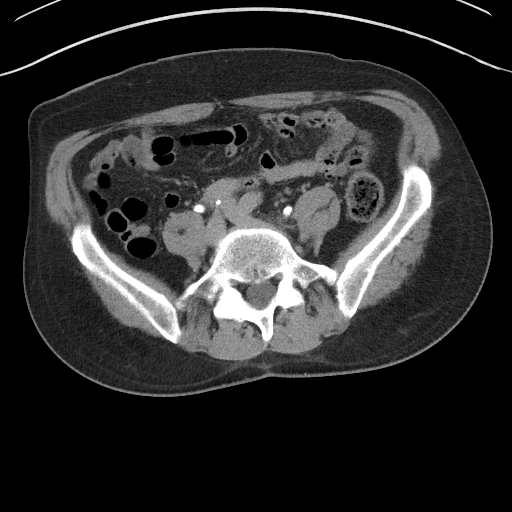
[im 47/110  lung]
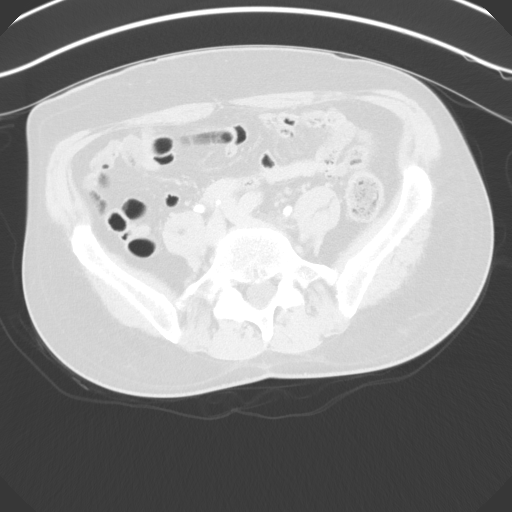
[im 63/110  soft-tissue]
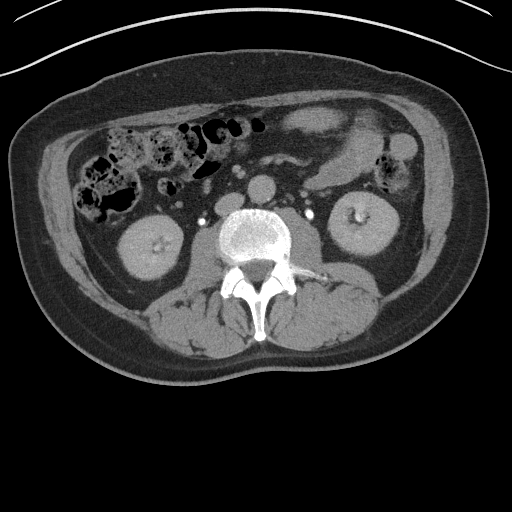
[im 63/110  lung]
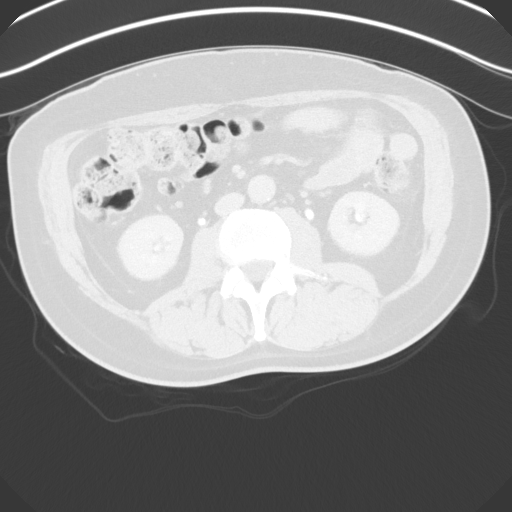
[im 78/110  soft-tissue]
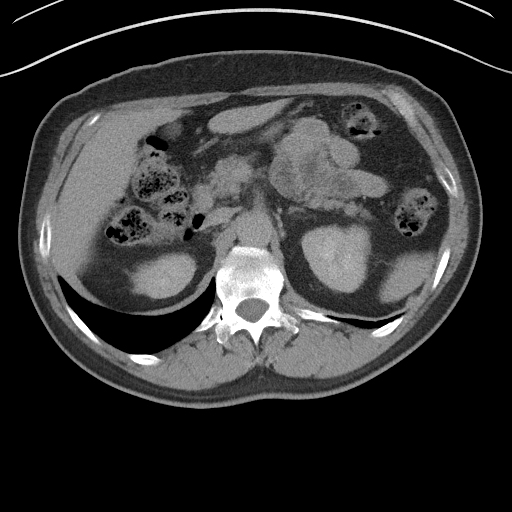
[im 78/110  lung]
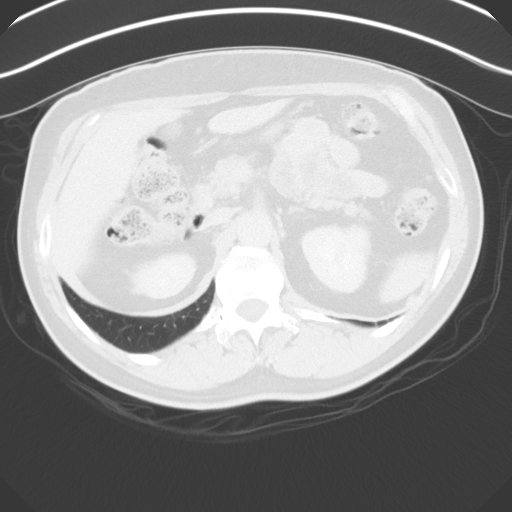
[im 94/110  soft-tissue]
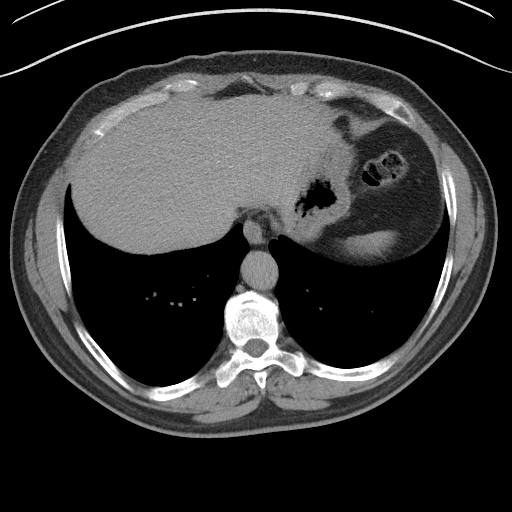
[im 94/110  lung]
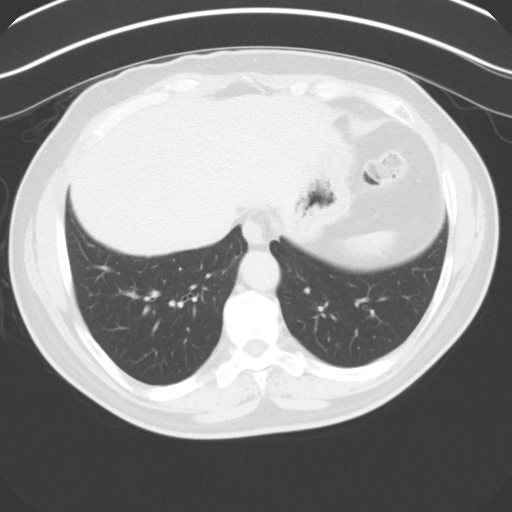

[14 of 32 positions shown; findings below may reference images not displayed]

FINDINGS: Lower chest: Lung bases are clear.

Hepatobiliary: No focal hepatic lesion. No biliary duct dilatation.
Gallbladder is normal. Common bile duct is normal.

Pancreas: Pancreas is normal. No ductal dilatation. No pancreatic
inflammation.

Spleen: Normal spleen

Adrenals/urinary tract: Adrenal glands are normal. No
nephrolithiasis ureterolithiasis. No enhancing renal cortical
lesion. No filling defects within collecting systems or ureters. No
bladder calculi, enhancing bladder lesions, or filling defect within
the bladder.

Stomach/Bowel: Stomach, small bowel, appendix, and cecum are normal.
The colon and rectosigmoid colon are normal.

Vascular/Lymphatic: Abdominal aorta is normal caliber with
atherosclerotic calcification. There is no retroperitoneal or
periportal lymphadenopathy. No pelvic lymphadenopathy.

Reproductive: Prostate normal

Other: No free fluid.

Musculoskeletal: No aggressive osseous lesion.
IMPRESSION: 1. No explanation for hematuria. No nephrolithiasis,
ureterolithiasis, enhancing renal cortical lesion, or filling
defects within the collecting systems.
2. No bladder stones or filling defects in the bladder which does
not excluded a bladder lesion.

## 2018-12-05 ENCOUNTER — Encounter: Payer: Self-pay | Admitting: Pain Medicine

## 2018-12-05 ENCOUNTER — Telehealth: Payer: Self-pay

## 2018-12-05 NOTE — Telephone Encounter (Signed)
Pt is returning Lori's call for visit tomorrow, he ask for her to call him back between1 and 1:15 he is running errands.

## 2018-12-06 ENCOUNTER — Telehealth: Payer: Self-pay | Admitting: *Deleted

## 2018-12-06 ENCOUNTER — Ambulatory Visit: Payer: PRIVATE HEALTH INSURANCE | Attending: Pain Medicine | Admitting: Pain Medicine

## 2018-12-06 ENCOUNTER — Other Ambulatory Visit: Payer: Self-pay

## 2018-12-06 DIAGNOSIS — N50819 Testicular pain, unspecified: Secondary | ICD-10-CM

## 2018-12-06 DIAGNOSIS — Z79899 Other long term (current) drug therapy: Secondary | ICD-10-CM

## 2018-12-06 DIAGNOSIS — M899 Disorder of bone, unspecified: Secondary | ICD-10-CM | POA: Insufficient documentation

## 2018-12-06 DIAGNOSIS — G894 Chronic pain syndrome: Secondary | ICD-10-CM

## 2018-12-06 DIAGNOSIS — M792 Neuralgia and neuritis, unspecified: Secondary | ICD-10-CM

## 2018-12-06 DIAGNOSIS — N50812 Left testicular pain: Secondary | ICD-10-CM | POA: Insufficient documentation

## 2018-12-06 DIAGNOSIS — G8929 Other chronic pain: Secondary | ICD-10-CM

## 2018-12-06 DIAGNOSIS — N509 Disorder of male genital organs, unspecified: Secondary | ICD-10-CM | POA: Insufficient documentation

## 2018-12-06 DIAGNOSIS — Z789 Other specified health status: Secondary | ICD-10-CM

## 2018-12-06 MED ORDER — TRAMADOL HCL 50 MG PO TABS: 50.0000 mg | ORAL_TABLET | Freq: Four times a day (QID) | ORAL | 5 refills | Status: DC | PRN

## 2018-12-06 MED ORDER — GABAPENTIN 300 MG PO CAPS: 300.0000 mg | ORAL_CAPSULE | Freq: Four times a day (QID) | ORAL | 5 refills | Status: DC

## 2018-12-06 NOTE — Progress Notes (Signed)
Pain Management Virtual Encounter Note - Virtual Visit via Telephone Telehealth (real-time audio visits between healthcare provider and patient).   Patient's Phone No. & Preferred Pharmacy:  [phone] (home); [phone] (mobile); (Preferred) [phone] [email or web address]  [facility] [place] [address] [place]enee Harder[address] [place] [address] Phone: [phone] Fax: [phone]    Pre-screening note:  Our staff contacted Dylan [name] and offered him an "in person", "face-to-face" appointment versus a telephone encounter. He indicated preferring the telephone encounter, at this time.   Reason for Virtual Visit: COVID-19*  Social distancing based on CDC and AMA recommendations.   I contacted Dylan [name] on 12/06/2018 via telephone.      I clearly identified myself as Oswaldo DoneFrancisco A Myna Freimark, MD. I verified that I was speaking with the correct person using two identifiers (Name: Dylan [name], and date of birth: 05/05/1950).  Advanced Informed Consent I sought verbal advanced consent from Dylan [name] for virtual visit interactions. I informed Dylan [name] of possible security and privacy concerns, risks, and limitations associated with providing "not-in-person" medical evaluation and management services. I also informed Dylan [name] of the availability of "in-person" appointments. Finally, I informed him that there would be a charge for the virtual visit and that he could be  personally, fully or partially, financially responsible for it. Dylan [name] expressed understanding and agreed to proceed.   Historic Elements   [name] is a 68 y.o. year old, male patient evaluated today after his last encounter by our practice on 12/05/2018. Dylan [name]  has a past medical history of Genital disorder, male (08/16/2005), Lumbago (01/17/2009), Pain syndrome, chronic, and Sciatica. He also  has a past surgical history that includes Back surgery (02/2009). Mr.  Dylan [name] has a current medication list which includes the following prescription(s): gabapentin, latanoprost, and tramadol. He  reports that he has never smoked. He has never used smokeless tobacco. He reports current alcohol use. No history on file for drug. Dylan [name] has No Known Allergies.   HPI  Today, he is being contacted for medication management.  Lab updates needed.  The patient indicates doing well with the current medication regimen. No adverse reactions or side effects reported to the medications.   Pharmacotherapy Assessment  Analgesic: Tramadol 50 mg, 1 tab PO q6 hrs (200 mg/day) MME/day:20 mg/day.   Monitoring: Pharmacotherapy: No side-effects or adverse reactions reported. [place] PMP: PDMP reviewed during this encounter.       Compliance: No problems identified. Effectiveness: Clinically acceptable. Plan: Refer to "POC".  UDS:  Summary  Date Value Ref Range Status  12/30/2016 FINAL  Final    Comment:    ==================================================================== TOXASSURE SELECT 13 (MW) ==================================================================== Test                             Result       Flag       Units Drug Present and Declared for Prescription Verification   Tramadol                       >8197        EXPECTED   ng/mg creat   O-Desmethyltramadol            8007         EXPECTED   ng/mg creat   N-Desmethyltramadol            782  EXPECTED   ng/mg creat    Source of tramadol is a prescription medication.    O-desmethyltramadol and N-desmethyltramadol are expected    metabolites of tramadol. ==================================================================== Test                      Result    Flag   Units      Ref Range   Creatinine              61               mg/dL      >=49 ==================================================================== Declared Medications:  The flagging and interpretation on this report are based on the   following declared medications.  Unexpected results may arise from  inaccuracies in the declared medications.  **Note: The testing scope of this panel includes these medications:  Tramadol  **Note: The testing scope of this panel does not include following  reported medications:  Gabapentin  Latanoprost ==================================================================== For clinical consultation, please call [phone]. ====================================================================    Laboratory Chemistry Profile (12 mo)  Renal: No results found for requested labs within last 8760 hours.  Lab Results  Component Value Date   GFRAA >60 01/19/2016   GFRNONAA >60 01/19/2016   Hepatic: No results found for requested labs within last 8760 hours. Lab Results  Component Value Date   AST 26 01/19/2016   ALT 19 01/19/2016   Other: No results found for requested labs within last 8760 hours. Note: Above Lab results reviewed.  Imaging  Last 90 days:  No results found.  Assessment  The primary encounter diagnosis was Chronic pain syndrome. 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  Plan of Care  I have changed Dylan Ramsay. Berhe "Dylan [name]"'s gabapentin. I am also having him maintain his latanoprost and traMADol.  Pharmacotherapy (Medications Ordered): Meds ordered this encounter  Medications  . traMADol (ULTRAM) 50 MG tablet    Sig: Take 1 tablet (50 mg total) by mouth every 6 (six) hours as needed for severe pain.    Dispense:  120 tablet    Refill:  5    Chronic Pain: STOP Act (Not applicable) Fill 1 day early if closed on refill date. Do not fill until: 12/28/2018. To last until: 06/26/2019. Avoid benzodiazepines within 8 hours of opioids   . gabapentin (NEURONTIN) 300 MG capsule    Sig: Take 1 capsule (300 mg total) by mouth 4 (four) times daily.    Dispense:  120 capsule    Refill:  5    Fill one day early if pharmacy is closed on scheduled refill date. May substitute for generic if available.   Orders:  Orders Placed This Encounter  Procedures  . ToxASSURE Select 13 (MW), Urine    Volume: 30 ml(s). Minimum 3 ml of urine is needed. Document temperature of fresh sample. Indications: Long term (current) use of opiate analgesic (Z79.891)  . Comp. Metabolic Panel (12)    With GFR. Indications: Chronic Pain Syndrome (G89.4) & Pharmacotherapy (F02.774)    Order Specific Question:   Has the patient fasted?    Answer:   No    Order Specific Question:   CC Results    Answer:   PCP-NURSE [[id number]]  . Magnesium    Indication: Pharmacologic therapy (J28.786)  Order Specific Question:   CC Results    Answer:   PCP-NURSE [[id number]]  . Vitamin B12    Indication: Pharmacologic therapy (O16.073).    Order Specific Question:   CC Results    Answer:   PCP-NURSE [[id number]]  . Sedimentation rate    Indication: Disorder of skeletal system (M89.9)    Order Specific Question:   CC Results    Answer:   PCP-NURSE [[id number]]  . 25-Hydroxyvitamin D Lcms D2+D3    Indication: Disorder of skeletal system (M89.9).    Order Specific Question:   CC Results    Answer:   PCP-NURSE [[id number]]  . C-reactive protein    Indication: Problems influencing health status (Z78.9)    Order Specific Question:   CC Results    Answer:   PCP-NURSE [[id number]]   Follow-up plan:   Return in about 2 weeks (around 12/20/2018) for (VV), E/M to review lab work results.      Interventional therapies: Planned, scheduled, and/or pending:   Not at this time.   Considering:   None at this time.    Palliative PRN treatment(s):   None at this time.     Recent Visits No visits were found meeting these conditions.  Showing recent visits within past 90 days and meeting  all other requirements   Today's Visits Date Type Provider Dept  12/06/18 Telemedicine Milinda Pointer, MD [facility]  Showing today's visits and meeting all other requirements   Future Appointments No visits were found meeting these conditions.  Showing future appointments within next 90 days and meeting all other requirements   I discussed the assessment and treatment plan with the patient. The patient was provided an opportunity to ask questions and all were answered. The patient agreed with the plan and demonstrated an understanding of the instructions.  Patient advised to call back or seek an in-person evaluation if the symptoms or condition worsens.  Total duration of non-face-to-face encounter: 12 minutes.  Note by: Gaspar Cola, MD Date: 12/06/2018; Time: 11:17 AM  Note: This dictation was prepared with Dragon dictation. Any transcriptional errors that may result from this process are unintentional.  Disclaimer:  * Given the special circumstances of the COVID-19 pandemic, the federal government has announced that the Office for Civil Rights (OCR) will exercise its enforcement discretion and will not impose penalties on physicians using telehealth in the event of noncompliance with regulatory requirements under the [place] (HIPAA) in connection with the good faith provision of telehealth during the EXHBZ-16 national public health emergency. ([place])

## 2018-12-08 ENCOUNTER — Other Ambulatory Visit
Admission: RE | Admit: 2018-12-08 | Discharge: 2018-12-08 | Disposition: A | Discharge status: Home / Self Care | Payer: PRIVATE HEALTH INSURANCE | Source: Ambulatory Visit | Attending: Pain Medicine | Admitting: Pain Medicine

## 2018-12-08 DIAGNOSIS — G894 Chronic pain syndrome: Secondary | ICD-10-CM | POA: Diagnosis not present

## 2018-12-08 DIAGNOSIS — Z79899 Other long term (current) drug therapy: Secondary | ICD-10-CM | POA: Diagnosis not present

## 2018-12-08 DIAGNOSIS — Z789 Other specified health status: Secondary | ICD-10-CM | POA: Insufficient documentation

## 2018-12-08 DIAGNOSIS — M899 Disorder of bone, unspecified: Secondary | ICD-10-CM | POA: Insufficient documentation

## 2018-12-08 LAB — COMPREHENSIVE METABOLIC PANEL
ALT: 15 U/L (ref 0–44)
AST: 22 U/L (ref 15–41)
Albumin: 4.3 g/dL (ref 3.5–5.0)
Alkaline Phosphatase: 61 U/L (ref 38–126)
Anion gap: 8 (ref 5–15)
BUN: 17 mg/dL (ref 8–23)
CO2: 25 mmol/L (ref 22–32)
Calcium: 9 mg/dL (ref 8.9–10.3)
Chloride: 104 mmol/L (ref 98–111)
Creatinine, Ser: 0.82 mg/dL (ref 0.61–1.24)
GFR calc Af Amer: >60 mL/min (ref >60)
GFR calc non Af Amer: >60 mL/min (ref >60)
Glucose, Bld: 100 mg/dL — ABNORMAL HIGH (ref 70–99)
Potassium: 3.9 mmol/L (ref 3.5–5.1)
Sodium: 137 mmol/L (ref 135–145)
Total Bilirubin: 0.6 mg/dL (ref 0.3–1.2)
Total Protein: 7.1 g/dL (ref 6.5–8.1)

## 2018-12-08 LAB — VITAMIN D 25 HYDROXY (VIT D DEFICIENCY, FRACTURES): Vit D, 25-Hydroxy: 25.44 ng/mL — ABNORMAL LOW (ref 30–100)

## 2018-12-08 LAB — C-REACTIVE PROTEIN: CRP: 2 mg/dL — ABNORMAL HIGH (ref <1.0)

## 2018-12-08 LAB — MAGNESIUM: Magnesium: 2.5 mg/dL — ABNORMAL HIGH (ref 1.7–2.4)

## 2018-12-08 LAB — SEDIMENTATION RATE: Sed Rate: 9 mm/hr (ref 0–20)

## 2018-12-08 LAB — VITAMIN B12: Vitamin B-12: 410 pg/mL (ref 180–914)

## 2018-12-11 LAB — MISC LABCORP TEST (SEND OUT): Labcorp test code: 738256

## 2018-12-19 ENCOUNTER — Encounter: Payer: Self-pay | Admitting: Pain Medicine

## 2018-12-19 DIAGNOSIS — R7982 Elevated C-reactive protein (CRP): Secondary | ICD-10-CM | POA: Insufficient documentation

## 2018-12-19 DIAGNOSIS — E559 Vitamin D deficiency, unspecified: Secondary | ICD-10-CM | POA: Insufficient documentation

## 2018-12-19 NOTE — Progress Notes (Signed)
Pain Management Virtual Encounter Note - Virtual Visit via Telephone Telehealth (real-time audio visits between healthcare provider and patient).   Patient's Phone No. & Preferred Pharmacy:  [phone] (home); [phone] (mobile); (Preferred) [phone] [email or web address]  [place], [place] - [place] [address] Phone: [phone] Fax: [phone]    Pre-screening note:  Our staff contacted Mr. Munyon and offered him an "in person", "face-to-face" appointment versus a telephone encounter. He indicated preferring the telephone encounter, at this time.   Reason for Virtual Visit: COVID-19*  Social distancing based on CDC and AMA recommendations.   I contacted Zaylon Bossier Kasel on 12/20/2018 via telephone.      I clearly identified myself as Gaspar Cola, MD. I verified that I was speaking with the correct person using two identifiers (Name: ABHISHEK LEVESQUE, and date of birth: 21-Sep-1950).  Advanced Informed Consent I sought verbal advanced consent from Yehuda Mao for virtual visit interactions. I informed Mr. Gelles of possible security and privacy concerns, risks, and limitations associated with providing "not-in-person" medical evaluation and management services. I also informed Mr. Lurz of the availability of "in-person" appointments. Finally, I informed him that there would be a charge for the virtual visit and that he could be  personally, fully or partially, financially responsible for it. Mr. Italiano expressed understanding and agreed to proceed.   Historic Elements   Mr. ALICK LECOMTE is a 68 y.o. year old, male patient evaluated today after his last encounter by our practice on 12/06/2018. Mr. Miron  has a past medical history of Genital disorder, male (08/16/2005), Lumbago (01/17/2009), Pain syndrome, chronic, and Sciatica. He also  has a past surgical history that includes Back surgery (02/2009). Mr.  Haffey has a current medication list which includes the following prescription(s): gabapentin, latanoprost, and tramadol. He  reports that he has never smoked. He has never used smokeless tobacco. He reports current alcohol use. No history on file for drug. Mr. Tracz has No Known Allergies.   HPI  Today, he is being contacted for follow-up evaluation to review the patient's lab work.  The patient found to have a vitamin D insufficiency, mildly elevated levels of magnesium, and elevated C-reactive protein.  Today I went over those results with the patient and I recommended that he follow-up with his primary care physician.  I have recommended that he take some over-the-counter vitamin D and keep an eye on it for possible causes of that C-reactive protein to be elevated.  I also recommended that he sign up with "my chart" so that we can keep up with the information that I have been providing him.  Pharmacotherapy Assessment  Analgesic: Tramadol 50 mg, 1 tab PO q6 hrs (200 mg/day) MME/day:20 mg/day.   Monitoring: Pharmacotherapy: No side-effects or adverse reactions reported. [place] PMP: PDMP reviewed during this encounter.       Compliance: No problems identified. Effectiveness: Clinically acceptable. Plan: Refer to "POC".  UDS:  Summary  Date Value Ref Range Status  12/30/2016 FINAL  Final    Comment:    ==================================================================== TOXASSURE SELECT 13 (MW) ==================================================================== Test                             Result       Flag       Units Drug Present and Declared for Prescription Verification   Tramadol                       >  8197        EXPECTED   ng/mg creat   O-Desmethyltramadol            8007         EXPECTED   ng/mg creat   N-Desmethyltramadol            782          EXPECTED   ng/mg creat    Source of tramadol is a prescription medication.    O-desmethyltramadol and N-desmethyltramadol are  expected    metabolites of tramadol. ==================================================================== Test                      Result    Flag   Units      Ref Range   Creatinine              61               mg/dL      >=26 ==================================================================== Declared Medications:  The flagging and interpretation on this report are based on the  following declared medications.  Unexpected results may arise from  inaccuracies in the declared medications.  **Note: The testing scope of this panel includes these medications:  Tramadol  **Note: The testing scope of this panel does not include following  reported medications:  Gabapentin  Latanoprost ==================================================================== For clinical consultation, please call [phone]. ====================================================================    Laboratory Chemistry Profile (12 mo)  Renal: 12/08/2018: BUN 17; Creatinine, Ser 0.82  Lab Results  Component Value Date   GFRAA >60 12/08/2018   GFRNONAA >60 12/08/2018   Hepatic: 12/08/2018: Albumin 4.3 Lab Results  Component Value Date   AST 22 12/08/2018   ALT 15 12/08/2018   Other: 12/08/2018: CRP 2.0; Sed Rate 9; Vit D, 25-Hydroxy 25.44; Vitamin B-12 410 Note: Above Lab results reviewed.  Imaging  Last 90 days:  No results found.  Assessment  The primary encounter diagnosis was Chronic pain syndrome. Diagnoses of Orchialgia (Primary Area of Pain) (Left), Chronic groin pain (Secondary area of Pain) (Left), Elevated C-reactive protein (CRP), and Vitamin D insufficiency were also pertinent to this visit.  Plan of Care  I am having Faye Ramsay. Surratt "[name]" maintain his latanoprost, traMADol, and gabapentin.  Pharmacotherapy (Medications Ordered): No orders of the defined types were placed in this encounter.  Orders:  No orders of the defined types were placed in this encounter.  Follow-up plan:    No follow-ups on file.      Interventional therapies: Planned, scheduled, and/or pending:   Not at this time.   Considering:   None at this time.    Palliative PRN treatment(s):   None at this time.     Recent Visits Date Type Provider Dept  12/06/18 Telemedicine Delano Metz, MD [facility]  Showing recent visits within past 90 days and meeting all other requirements   Future Appointments Date Type Provider Dept  12/20/18 Telemedicine Delano Metz, MD [facility]  Showing future appointments within next 90 days and meeting all other requirements   I discussed the assessment and treatment plan with the patient. The patient was provided an opportunity to ask questions and all were answered. The patient agreed with the plan and demonstrated an understanding of the instructions.  Patient advised to call back or seek an in-person evaluation if the symptoms or condition worsens.  Total duration of non-face-to-face encounter: 15 minutes.  Note by: Oswaldo Done, MD Date: 12/20/2018; Time:  12:21 PM  Note: This dictation was prepared with Dragon dictation. Any transcriptional errors that may result from this process are unintentional.  Disclaimer:  * Given the special circumstances of the COVID-19 pandemic, the federal government has announced that the Office for Civil Rights (OCR) will exercise its enforcement discretion and will not impose penalties on physicians using telehealth in the event of noncompliance with regulatory requirements under the [employer] Insurance Portability and Accountability Act (HIPAA) in connection with the good faith provision of telehealth during the COVID-19 national public health emergency. (AMA)

## 2018-12-20 ENCOUNTER — Ambulatory Visit: Payer: PRIVATE HEALTH INSURANCE | Attending: Pain Medicine | Admitting: Pain Medicine

## 2018-12-20 ENCOUNTER — Other Ambulatory Visit: Payer: Self-pay

## 2018-12-20 DIAGNOSIS — N50812 Left testicular pain: Secondary | ICD-10-CM

## 2018-12-20 DIAGNOSIS — R1032 Left lower quadrant pain: Secondary | ICD-10-CM

## 2018-12-20 DIAGNOSIS — E559 Vitamin D deficiency, unspecified: Secondary | ICD-10-CM

## 2018-12-20 DIAGNOSIS — R7982 Elevated C-reactive protein (CRP): Secondary | ICD-10-CM

## 2018-12-20 DIAGNOSIS — G894 Chronic pain syndrome: Secondary | ICD-10-CM

## 2018-12-20 DIAGNOSIS — G8929 Other chronic pain: Secondary | ICD-10-CM

## 2018-12-20 NOTE — Progress Notes (Signed)
Test: CRP (C-reactive protein) levels Finding(s): Elevated  Normal Level(s): Less than <1.0 mg/dL. Clinical significance: C-reactive protein (CRP) is produced by the liver. The level of CRP rises when there is inflammation throughout the body. CRP goes up in response to inflammation. High levels suggests the presence of chronic inflammation but do not identify its location or cause. Drops of previously elevated levels suggest that the inflammation or infection is subsiding and/or responding to treatment. Signs and symptoms may include: Signs or symptoms, if present, would depend on the underlying inflammatory condition that is the cause of the elevated CRP level. Possible causes:  - Most common: High levels have been observed in obese patients, individuals with bacterial infections, chronic inflammation, or flare-ups of inflammatory conditions. Patient Recommendation(s): Unless contraindicated, consider the use of anti-inflammatory diet and medications. (visit http://inflammationfactor.com/ ) ___________________________________________________________________________________ 

## 2019-01-03 ENCOUNTER — Telehealth: Payer: PRIVATE HEALTH INSURANCE | Admitting: Pain Medicine

## 2019-01-31 ENCOUNTER — Ambulatory Visit: Payer: Self-pay | Admitting: Family Medicine

## 2019-01-31 NOTE — Telephone Encounter (Signed)
Patient of Dennis 

## 2019-01-31 NOTE — Telephone Encounter (Signed)
Pt reports dentist visit this AM, BP checked, 169/110, wrist cuff. States 2-3 weeks ago at ENT appt, 145/?. Pt denies any headache, no weakness, no visual changes, no dizziness.  No BP meds.  Requests OV vs. Virtual.  Appt made for Tuesday, first available with A. Pollak Covid screening negative.  Care advise given per protocol, pt verbalizes understanding. Advised pt purchase home monitor kit and continue to check and record BP. Pt states will do so.  Reason for Disposition . Systolic BP  >= 648 OR Diastolic >= 472  Answer Assessment - Initial Assessment Questions 1. BLOOD PRESSURE: "What is the blood pressure?" "Did you take at least two measurements 5 minutes apart?"    169/110 2. ONSET: "When did you take your blood pressure?"     At dentist office 3. HOW: "How did you obtain the blood pressure?" (e.g., visiting nurse, automatic home BP monitor)    At dentist 4. HISTORY: "Do you have a history of high blood pressure?"     no 5. MEDICATIONS: "Are you taking any medications for blood pressure?" "Have you missed any doses recently?"     no 6. OTHER SYMPTOMS: "Do you have any symptoms?" (e.g., headache, chest pain, blurred vision, difficulty breathing, weakness)    no  Protocols used: HIGH BLOOD PRESSURE-A-AH

## 2019-02-05 NOTE — Progress Notes (Signed)
       Patient: Dylan [name] Male    DOB: Nov 30, 1950   69 y.o.   MRN: [id number] Visit Date: 02/06/2019  Today's Provider: Trey Sailors, PA-C   Chief Complaint  Patient presents with  . Hypertension   Subjective:     HPI    Follow-up Patient presents today for a follow-up on his bp. Patient reported to the triage nurse at the [facility] his bp was 169/110 on 01/31/2019. Patient reports that she bp has been elevated lately.   BP Readings from Last 3 Encounters:  02/06/19 (!) 165/94  12/22/17 (!) 148/101  06/23/17 (!) 165/93   Last Colonoscopy 01/2010 in [facility] with internal hemorrhoids but otherwise normal.   Due for pneumonia 23 vaccine.   No Known Allergies   Current Outpatient Medications:  .  gabapentin (NEURONTIN) 300 MG capsule, Take 1 capsule (300 mg total) by mouth 4 (four) times daily., Disp: 120 capsule, Rfl: 5 .  latanoprost (XALATAN) 0.005 % ophthalmic solution, Place into both eyes daily. , Disp: , Rfl:  .  traMADol (ULTRAM) 50 MG tablet, Take 1 tablet (50 mg total) by mouth every 6 (six) hours as needed for severe pain., Disp: 120 tablet, Rfl: 5  Review of Systems  Social History   Tobacco Use  . Smoking status: Never Smoker  . Smokeless tobacco: Never Used  Substance Use Topics  . Alcohol use: Yes    Comment: rarely alcohol      Objective:   There were no vitals taken for this visit. There were no vitals filed for this visit.There is no height or weight on file to calculate BMI.   Physical Exam Constitutional:      Appearance: Normal appearance.  Cardiovascular:     Rate and Rhythm: Normal rate and regular rhythm.     Heart sounds: Normal heart sounds.  Pulmonary:     Effort: Pulmonary effort is normal.     Breath sounds: Normal breath sounds.  Skin:    General: Skin is warm and dry.  Neurological:     Mental Status: He is alert and oriented to person, place, and time. Mental status is at baseline.  Psychiatric:        Mood  and Affect: Mood normal.        Behavior: Behavior normal.      No results found for any visits on 02/06/19.     Assessment & Plan    1. Essential hypertension  Start amlodipine as below. Follow up in 1-2 weeks.   - CBC with Differential - Lipid Profile - amLODipine (NORVASC) 5 MG tablet; Take 1 tablet (5 mg total) by mouth daily.  Dispense: 90 tablet; Refill: 0  2. Colon cancer screening  - Cologuard  3. Hyperglycemia  - HgB A1c  4. Need for pneumococcal vaccination  - Pneumococcal polysaccharide vaccine 23-valent greater than or equal to 2yo subcutaneous/IM  The entirety of the information documented in the History of Present Illness, Review of Systems and Physical Exam were personally obtained by me. Portions of this information were initially documented by Kavin Leech, CMA and reviewed by me for thoroughness and accuracy.       Trey Sailors, PA-C  [facility]

## 2019-02-06 ENCOUNTER — Encounter: Payer: Self-pay | Admitting: Physician Assistant

## 2019-02-06 ENCOUNTER — Other Ambulatory Visit: Payer: Self-pay | Admitting: Physician Assistant

## 2019-02-06 ENCOUNTER — Other Ambulatory Visit: Payer: Self-pay

## 2019-02-06 ENCOUNTER — Ambulatory Visit: Payer: PRIVATE HEALTH INSURANCE | Admitting: Physician Assistant

## 2019-02-06 VITALS — BP 165/94 | HR 73 | Temp 97.1°F | Wt 193.6 lb

## 2019-02-06 DIAGNOSIS — R739 Hyperglycemia, unspecified: Secondary | ICD-10-CM

## 2019-02-06 DIAGNOSIS — Z1211 Encounter for screening for malignant neoplasm of colon: Secondary | ICD-10-CM | POA: Diagnosis not present

## 2019-02-06 DIAGNOSIS — I10 Essential (primary) hypertension: Secondary | ICD-10-CM | POA: Diagnosis not present

## 2019-02-06 DIAGNOSIS — Z23 Encounter for immunization: Secondary | ICD-10-CM

## 2019-02-06 MED ORDER — AMLODIPINE BESYLATE 5 MG PO TABS: 5.0000 mg | ORAL_TABLET | Freq: Every day | ORAL | 0 refills | Status: DC

## 2019-02-06 NOTE — Patient Instructions (Signed)

## 2019-02-07 LAB — CBC WITH DIFFERENTIAL/PLATELET
Basophils Absolute: 0.1 10*3/uL (ref 0.0–0.2)
Basos: 1 %
EOS (ABSOLUTE): 0.1 10*3/uL (ref 0.0–0.4)
Eos: 2 %
Hematocrit: 40.7 % (ref 37.5–51.0)
Hemoglobin: 14.3 g/dL (ref 13.0–17.7)
Immature Grans (Abs): 0 10*3/uL (ref 0.0–0.1)
Immature Granulocytes: 0 %
Lymphocytes Absolute: 1.4 10*3/uL (ref 0.7–3.1)
Lymphs: 28 %
MCH: 29.3 pg (ref 26.6–33.0)
MCHC: 35.1 g/dL (ref 31.5–35.7)
MCV: 83 fL (ref 79–97)
Monocytes Absolute: 0.4 10*3/uL (ref 0.1–0.9)
Monocytes: 8 %
Neutrophils Absolute: 3.1 10*3/uL (ref 1.4–7.0)
Neutrophils: 61 %
Platelets: 281 10*3/uL (ref 150–450)
RBC: 4.88 x10E6/uL (ref 4.14–5.80)
RDW: 12.5 % (ref 11.6–15.4)
WBC: 5.1 10*3/uL (ref 3.4–10.8)

## 2019-02-07 LAB — LIPID PANEL
Chol/HDL Ratio: 4.5 ratio (ref 0.0–5.0)
Cholesterol, Total: 199 mg/dL (ref 100–199)
HDL: 44 mg/dL (ref >39)
LDL Chol Calc (NIH): 128 mg/dL — ABNORMAL HIGH (ref 0–99)
Triglycerides: 152 mg/dL — ABNORMAL HIGH (ref 0–149)
VLDL Cholesterol Cal: 27 mg/dL (ref 5–40)

## 2019-02-07 LAB — HEMOGLOBIN A1C
Est. average glucose Bld gHb Est-mCnc: 108 mg/dL
Hgb A1c MFr Bld: 5.4 % (ref 4.8–5.6)

## 2019-02-08 ENCOUNTER — Telehealth: Payer: Self-pay

## 2019-02-08 NOTE — Telephone Encounter (Signed)
-----   Message from Trey Sailors, [place] sent at 02/08/2019  4:00 PM EST ----- Labs are normal except for cholesterol which shows elevated CVD risk at 17%. This is the chance of having a heart attack or stroke over the next ten years. Would recommend a statin medication to lower cholesterol and we can talk about this further at follow up.

## 2019-02-08 NOTE — Telephone Encounter (Signed)
Left voicemail for patient to return call to be advised of lab results. If patient returns call okay for [facility] to advise patient.

## 2019-02-15 NOTE — Telephone Encounter (Signed)
Pt. Called back and given results and instructions. Verbalizes understanding.

## 2019-03-09 ENCOUNTER — Other Ambulatory Visit: Payer: Self-pay

## 2019-03-09 ENCOUNTER — Ambulatory Visit: Payer: PRIVATE HEALTH INSURANCE | Admitting: Physician Assistant

## 2019-03-09 ENCOUNTER — Encounter: Payer: Self-pay | Admitting: Physician Assistant

## 2019-03-09 VITALS — BP 141/85 | HR 57 | Temp 96.8°F | Wt 187.0 lb

## 2019-03-09 DIAGNOSIS — I10 Essential (primary) hypertension: Secondary | ICD-10-CM

## 2019-03-09 MED ORDER — AMLODIPINE BESYLATE 10 MG PO TABS: 10.0000 mg | ORAL_TABLET | Freq: Every day | ORAL | 1 refills | Status: DC

## 2019-03-09 NOTE — Progress Notes (Signed)
Patient: Dylan [name] Male    DOB: Sep 29, 1950   69 y.o.   MRN: [id number] Visit Date: 03/09/2019  Today's Provider: Trey Sailors, PA-C   Chief Complaint  Patient presents with  . Hypertension   Subjective:     HPI  Hypertension, follow-up:  BP Readings from Last 3 Encounters:  02/06/19 (!) 165/94  12/22/17 (!) 148/101  06/23/17 (!) 165/93    He was last seen for hypertension 1 months ago.  BP at that visit was 165/94. Management changes since that visit include:Amlodipine 5 MG tablet . He reports good compliance with treatment. He is not having side effects.  He is exercising. He is not adherent to low salt diet.   Outside blood pressures are doesn't remember. He is experiencing none.  Patient denies chest pain, dyspnea, fatigue and lower extremity edema141.   Cardiovascular risk factors include hypertension.  Use of agents associated with hypertension: none.     Weight trend: decreasing steadily Wt Readings from Last 3 Encounters:  02/06/19 193 lb 9.6 oz (87.8 kg)  12/22/17 200 lb (90.7 kg)  06/23/17 200 lb (90.7 kg)    Current diet: in general, a "healthy" diet    ------------------------------------------------------------------------     No Known Allergies   Current Outpatient Medications:  .  amLODipine (NORVASC) 5 MG tablet, Take 1 tablet (5 mg total) by mouth daily., Disp: 90 tablet, Rfl: 0 .  gabapentin (NEURONTIN) 300 MG capsule, Take 1 capsule (300 mg total) by mouth 4 (four) times daily., Disp: 120 capsule, Rfl: 5 .  latanoprost (XALATAN) 0.005 % ophthalmic solution, Place into both eyes daily. , Disp: , Rfl:  .  traMADol (ULTRAM) 50 MG tablet, Take 1 tablet (50 mg total) by mouth every 6 (six) hours as needed for severe pain., Disp: 120 tablet, Rfl: 5  Review of Systems  Constitutional: Negative.   Respiratory: Negative.   Cardiovascular: Negative.   Neurological: Negative.     Social History   Tobacco Use  . Smoking  status: Never Smoker  . Smokeless tobacco: Never Used  Substance Use Topics  . Alcohol use: Yes    Comment: rarely alcohol      Objective:   There were no vitals taken for this visit. There were no vitals filed for this visit.There is no height or weight on file to calculate BMI.   Physical Exam Constitutional:      Appearance: Normal appearance.  Cardiovascular:     Rate and Rhythm: Normal rate and regular rhythm.     Heart sounds: Normal heart sounds.  Pulmonary:     Effort: Pulmonary effort is normal.     Breath sounds: Normal breath sounds.  Skin:    General: Skin is warm and dry.  Neurological:     Mental Status: He is alert and oriented to person, place, and time. Mental status is at baseline.  Psychiatric:        Mood and Affect: Mood normal.        Behavior: Behavior normal.      No results found for any visits on 03/09/19.     Assessment & Plan    1. Essential hypertension  Doing better though still slightly high. Increase as below. Follow up in 3-6 months with Maurine Minister.   - amLODipine (NORVASC) 10 MG tablet; Take 1 tablet (10 mg total) by mouth daily.  Dispense: 90 tablet; Refill: 1  The entirety of the information documented in the History of  Present Illness, Review of Systems and Physical Exam were personally obtained by me. Portions of this information were initially documented by [facility] and reviewed by me for thoroughness and accuracy.       Trinna Post, PA-C  [facility]

## 2019-03-09 NOTE — Patient Instructions (Signed)

## 2019-04-26 ENCOUNTER — Other Ambulatory Visit: Payer: Self-pay

## 2019-04-26 ENCOUNTER — Encounter: Payer: Self-pay | Admitting: Family Medicine

## 2019-04-26 ENCOUNTER — Ambulatory Visit: Payer: PRIVATE HEALTH INSURANCE | Admitting: Family Medicine

## 2019-04-26 VITALS — BP 150/78 | HR 62 | Temp 97.1°F | Wt 186.0 lb

## 2019-04-26 DIAGNOSIS — I10 Essential (primary) hypertension: Secondary | ICD-10-CM

## 2019-04-26 DIAGNOSIS — E782 Mixed hyperlipidemia: Secondary | ICD-10-CM

## 2019-04-26 MED ORDER — HYDROCHLOROTHIAZIDE 25 MG PO TABS: 12.5000 mg | ORAL_TABLET | Freq: Every day | ORAL | 1 refills | Status: DC

## 2019-04-26 NOTE — Progress Notes (Signed)
Patient: Dylan [name] Male    DOB: 1950/12/02   69 y.o.   MRN: [id number] Visit Date: 04/26/2019  Today's Provider: Vernie Murders, PA   Chief Complaint  Patient presents with  . Hypertension   Subjective:     HPI  Hypertension, follow-up:  BP Readings from Last 3 Encounters:  04/26/19 (!) 150/78  03/09/19 (!) 141/85  02/06/19 (!) 165/94    He was last seen for hypertension 6 weeks ago.  BP at that visit was 141/85. Management changes since that visit include increase Amlodipine to 10 mg. He reports good compliance with treatment. He is not having side effects.  He is exercising. He is not adherent to low salt diet.   Outside blood pressures are being checked at home. BP readings are still elevated.Marland Kitchen He is experiencing none.  Patient denies chest pain, chest pressure/discomfort, irregular heart beat, lower extremity edema and palpitations.   Cardiovascular risk factors include advanced age (older than 28 for men, 25 for women), hypertension and male gender.  Use of agents associated with hypertension: none.     Weight trend: stable Wt Readings from Last 3 Encounters:  04/26/19 186 lb (84.4 kg)  03/09/19 187 lb (84.8 kg)  02/06/19 193 lb 9.6 oz (87.8 kg)    Current diet: well balanced  ------------------------------------------------------------------------ Past Medical History:  Diagnosis Date  . 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 Lumbago 01/17/2009  . Pain syndrome, chronic   . Sciatica    Past Surgical History:  Procedure Laterality Date  . BACK SURGERY  02/2009   ruptured disk   History reviewed. No pertinent family history.  No Known Allergies  Current Outpatient Medications:  .  amLODipine (NORVASC) 10 MG tablet, Take 1 tablet (10 mg total) by mouth daily., Disp: 90 tablet, Rfl: 1 .  gabapentin (NEURONTIN) 300 MG capsule, Take 1 capsule (300 mg total) by mouth 4 (four) times daily., Disp: 120 capsule, Rfl: 5 .  latanoprost  (XALATAN) 0.005 % ophthalmic solution, Place into both eyes daily. , Disp: , Rfl:  .  traMADol (ULTRAM) 50 MG tablet, Take 1 tablet (50 mg total) by mouth every 6 (six) hours as needed for severe pain., Disp: 120 tablet, Rfl: 5  Review of Systems  Constitutional: Negative.   Respiratory: Negative.   Cardiovascular: Negative.   Musculoskeletal: Negative.     Social History   Tobacco Use  . Smoking status: Never Smoker  . Smokeless tobacco: Never Used  Substance Use Topics  . Alcohol use: Yes    Comment: rarely alcohol     Objective:   BP (!) 150/78 (BP Location: Right Arm, Patient Position: Sitting, Cuff Size: Normal)   Pulse 62   Temp (!) 97.1 F (36.2 C) (Temporal)   Wt 186 lb (84.4 kg)   BMI 25.94 kg/m  Vitals:   04/26/19 1000  BP: (!) 150/78  Pulse: 62  Temp: (!) 97.1 F (36.2 C)  TempSrc: Temporal  Weight: 186 lb (84.4 kg)  Body mass index is 25.94 kg/m.  Physical Exam Constitutional:      General: He is not in acute distress.    Appearance: He is well-developed.  HENT:     Head: Normocephalic and atraumatic.     Right Ear: Hearing normal.     Left Ear: Hearing normal.     Nose: Nose normal.  Eyes:     General: Lids are normal. No scleral icterus.       Right eye:  No discharge.        Left eye: No discharge.     Conjunctiva/sclera: Conjunctivae normal.  Cardiovascular:     Rate and Rhythm: Normal rate.     Heart sounds: Normal heart sounds.  Pulmonary:     Effort: Pulmonary effort is normal. No respiratory distress.  Musculoskeletal:        General: Normal range of motion.     Cervical back: Normal range of motion and neck supple.  Skin:    Findings: No lesion or rash.  Neurological:     Mental Status: He is alert and oriented to person, place, and time.  Psychiatric:        Speech: Speech normal.        Behavior: Behavior normal.        Thought Content: Thought content normal.       Assessment & Plan    1. Essential hypertension BP still  a little elevated despite using Amlodipine at 10 mg qd. No chest pains, dyspnea, peripheral edema or palpitations. Renal function normal 4 months ago with creatinine 0.82 and GFR >60. Will add HCTZ and recheck in 3 months. - hydrochlorothiazide (HYDRODIURIL) 25 MG tablet; Take 0.5 tablets (12.5 mg total) by mouth daily.  Dispense: 45 tablet; Refill: 1  2. Mixed hyperlipidemia Triglycerides 152 and LDL 128 on 02-06-19. Wants to try low fat diet and increase exercise before starting a statin. Recheck in 3 months.      Dortha Kern, PA  [facility]

## 2019-05-05 ENCOUNTER — Other Ambulatory Visit: Payer: Self-pay | Admitting: Physician Assistant

## 2019-05-05 DIAGNOSIS — I10 Essential (primary) hypertension: Secondary | ICD-10-CM

## 2019-05-05 NOTE — Telephone Encounter (Signed)
Requested medication (s) are due for refill today: yes  Requested medication (s) are on the active medication list: yes  Last refill:  02/06/19  Future visit scheduled: yes  Notes to clinic:  expired 03/09/19   Requested Prescriptions  Pending Prescriptions Disp Refills   amLODipine (NORVASC) 5 MG tablet [Pharmacy Med Name: AMLODIPINE BESYLATE 5 MG TAB] 90 tablet 0    Sig: TAKE 1 TABLET BY MOUTH DAILY      Cardiovascular:  Calcium Channel Blockers Failed - 05/05/2019 10:54 AM      Failed - Last BP in normal range    BP Readings from Last 1 Encounters:  04/26/19 (!) 150/78          Passed - Valid encounter within last 6 months    Recent Outpatient Visits           1 week ago Essential hypertension   [facility], [place] Cipro, [place]   1 month ago Essential hypertension   [employer], [place], [place]   2 months ago Essential hypertension   [facility]st [place], [place]   2 years ago Sore throat   [employer] [facility], [place], [place]   3 years ago Elevated BP without diagnosis of hypertension   [facility] [facility], [place] Cipro, [place]       Future Appointments             In 2 months Chrism[place], [place], [place] [facility], [facility]

## 2019-06-25 ENCOUNTER — Telehealth: Payer: PRIVATE HEALTH INSURANCE | Admitting: Pain Medicine

## 2019-06-25 ENCOUNTER — Encounter: Payer: PRIVATE HEALTH INSURANCE | Admitting: Pain Medicine

## 2019-06-25 NOTE — Progress Notes (Signed)
PROVIDER NOTE: Information contained herein reflects review and annotations entered in association with encounter. Interpretation of such information and data should be left to medically-trained personnel. Information provided to patient can be located elsewhere in the medical record under "Patient Instructions". Document created using STT-dictation technology, any transcriptional errors that may result from process are unintentional.    Patient: Dylan [name]  Service Category: E/M  Provider: Gaspar Cola, MD  DOB: 10/01/1950  DOS: 06/26/2019  Referring Provider: Margo Common, PA  MRN: [id number]  Setting: Ambulatory outpatient  PCP: Margo Common, PA  Type: Established Patient  Specialty: Interventional Pain Management    Location: Office  Delivery: Face-to-face     Primary Reason(s) for Visit: Encounter for prescription drug management. (Level of risk: moderate)  CC: Other (left scrotum)  HPI  [name] is a 69 y.o. year old, male patient, who comes today for a medication management evaluation. 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; Chronic low back pain; Chronic pain syndrome; Neuralgia neuritis, sciatic nerve; Long term current use of opiate analgesic; Long term prescription opiate use; Opiate use; Encounter for long-term current use of medication; Encounter for chronic pain management; Neuropathic pain; Neurogenic pain; Visceral pain; Chronic groin pain (Secondary area of Pain) (Left); Orchialgia (Primary Area of Pain) (Left); Persistent testicular pain (Left); Pharmacologic therapy; Disorder of skeletal system; Problems influencing health status; Elevated C-reactive protein (CRP); and Vitamin D insufficiency on their problem list. His primarily concern today is the Other (left scrotum)  Pain Assessment: Location: Left Scrotum Radiating: does not radiate Onset: More than a month ago Duration: Chronic pain Quality: Aching, Sharp, Tender, Sore Severity: 2 /10 (subjective,  self-reported pain score)  Note: Reported level is compatible with observation.                         When using our objective Pain Scale, levels between 6 and 10/10 are said to belong in an emergency room, as it progressively worsens from a 6/10, described as severely limiting, requiring emergency care not usually available at an outpatient pain management facility. At a 6/10 level, communication becomes difficult and requires great effort. Assistance to reach the emergency department may be required. Facial flushing and profuse sweating along with potentially dangerous increases in heart rate and blood pressure will be evident. Effect on ADL: does not Timing: Intermittent Modifying factors: medication BP: (!) 154/88  HR: 73  [name] was last scheduled for an appointment on Visit date not found for medication management. During today's appointment we reviewed [name]'s chronic pain status, as well as his outpatient medication regimen.  The patient indicates doing well with the current medication regimen. No adverse reactions or side effects reported to the medications.   The patient  has no history on file for drug. His body mass index is 26.5 kg/m.  Further details on both, my assessment(s), as well as the proposed treatment plan, please see below.  Controlled Substance Pharmacotherapy Assessment REMS (Risk Evaluation and Mitigation Strategy)  Analgesic: Tramadol 50 mg, 1 tab PO q6 hrs (200 mg/day) MME/day:20 mg/day.   No notes on file Pharmacokinetics: Liberation and absorption (onset of action): WNL Distribution (time to peak effect): WNL Metabolism and excretion (duration of action): WNL         Pharmacodynamics: Desired effects: Analgesia: [name] reports >50% benefit. Functional ability: Patient reports that medication allows him to accomplish basic ADLs Clinically meaningful improvement in function (CMIF): Sustained CMIF goals  met Perceived effectiveness: Described  as relatively effective, allowing for increase in activities of daily living (ADL) Undesirable effects: Side-effects or Adverse reactions: None reported Monitoring: [place] PMP: PDMP reviewed during this encounter. Online review of the past 92-monthperiod conducted. Compliant with practice rules and regulations Last UDS on record: Summary  Date Value Ref Range Status  12/30/2016 FINAL  Final    Comment:    ==================================================================== TOXASSURE SELECT 13 (MW) ==================================================================== Test                             Result       Flag       Units Drug Present and Declared for Prescription Verification   Tramadol                       >8197        EXPECTED   ng/mg creat   O-Desmethyltramadol            8007         EXPECTED   ng/mg creat   N-Desmethyltramadol            782          EXPECTED   ng/mg creat    Source of tramadol is a prescription medication.    O-desmethyltramadol and N-desmethyltramadol are expected    metabolites of tramadol. ==================================================================== Test                      Result    Flag   Units      Ref Range   Creatinine              61               mg/dL      >=20 ==================================================================== Declared Medications:  The flagging and interpretation on this report are based on the  following declared medications.  Unexpected results may arise from  inaccuracies in the declared medications.  **Note: The testing scope of this panel includes these medications:  Tramadol  **Note: The testing scope of this panel does not include following  reported medications:  Gabapentin  Latanoprost ==================================================================== For clinical consultation, please call [phone]====    UDS interpretation:  Compliant          Medication Assessment Form: Reviewed. Patient indicates being compliant with therapy Treatment compliance: Compliant Risk Assessment Profile: Aberrant behavior: See initial evaluations. None observed or detected today Comorbid factors increasing risk of overdose: See initial evaluation. No additional risks detected today Opioid risk tool (ORT):  Opioid Risk  12/22/2017  Alcohol 0  Illegal Drugs 0  Rx Drugs 0  Alcohol 0  Illegal Drugs 0  Rx Drugs 0  Age between 16-45 years  0  History of Preadolescent Sexual Abuse 0  Psychological Disease 0  Depression 0  Opioid Risk Tool Scoring 0  Opioid Risk Interpretation Low Risk    ORT Scoring interpretation table:  Score <3 = Low Risk for SUD  Score between 4-7 = Moderate Risk for SUD  Score >8 = High Risk for Opioid Abuse   Risk of substance use disorder (SUD): Low  Risk Mitigation Strategies:  Patient Counseling: Covered Patient-Prescriber Agreement (PPA): Present and active  Notification to other healthcare providers: Done  Pharmacologic Plan: No change in therapy, at this time.             Laboratory  Chemistry Profile   Renal Lab Results  Component Value Date   BUN 17 12/08/2018   CREATININE 0.82 12/08/2018   GFRAA >60 12/08/2018   GFRNONAA >60 12/08/2018   PROTEINUR NEGATIVE 02/27/2009     Electrolytes Lab Results  Component Value Date   NA 137 12/08/2018   K 3.9 12/08/2018   CL 104 12/08/2018   CALCIUM 9.0 12/08/2018   MG 2.5 (H) 12/08/2018   PHOS 3.3 10/14/2011     Hepatic Lab Results  Component Value Date   AST 22 12/08/2018   ALT 15 12/08/2018   ALBUMIN 4.3 12/08/2018   ALKPHOS 61 12/08/2018     ID Lab Results  Component Value Date   HCVAB NEGATIVE 02/28/2009     Bone Lab Results  Component Value Date   VD25OH 25.44 (L) 12/08/2018   25OHVITD1 34 01/19/2016   25OHVITD2 1.2 01/19/2016   25OHVITD3 33 01/19/2016     Endocrine Lab Results  Component Value Date   GLUCOSE  100 (H) 12/08/2018   GLUCOSEU NEGATIVE 02/27/2009   HGBA1C 5.4 02/06/2019     Neuropathy Lab Results  Component Value Date   VITAMINB12 410 12/08/2018   HGBA1C 5.4 02/06/2019     CNS No results found for: COLORCSF, APPEARCSF, RBCCOUNTCSF, WBCCSF, POLYSCSF, LYMPHSCSF, EOSCSF, PROTEINCSF, GLUCCSF, JCVIRUS, CSFOLI, IGGCSF, LABACHR, ACETBL, LABACHR, ACETBL   Inflammation (CRP: Acute  ESR: Chronic) Lab Results  Component Value Date   CRP 2.0 (H) 12/08/2018   ESRSEDRATE 9 12/08/2018     Rheumatology No results found for: RF, ANA, LABURIC, URICUR, LYMEIGGIGMAB, LYMEABIGMQN, HLAB27   Coagulation Lab Results  Component Value Date   PLT 281 02/06/2019     Cardiovascular Lab Results  Component Value Date   HGB 14.3 02/06/2019   HCT 40.7 02/06/2019     Screening Lab Results  Component Value Date   HCVAB NEGATIVE 02/28/2009     Cancer No results found for: CEA, CA125, LABCA2   Allergens No results found for: ALMOND, APPLE, ASPARAGUS, AVOCADO, BANANA, BARLEY, BASIL, BAYLEAF, GREENBEAN, LIMABEAN, WHITEBEAN, BEEFIGE, REDBEET, BLUEBERRY, BROCCOLI, CABBAGE, MELON, CARROT, CASEIN, CASHEWNUT, CAULIFLOWER, CELERY     Note: Lab results reviewed.   Recent Diagnostic Imaging Results  CT ABDOMEN PELVIS W WO CONTRAST CLINICAL DATA:  Gross hematuria  EXAM: CT ABDOMEN AND PELVIS WITHOUT AND WITH CONTRAST  TECHNIQUE: Multidetector CT imaging of the abdomen and pelvis was performed following the standard protocol before and following the bolus administration of intravenous contrast.  CONTRAST:  133m ISOVUE-300 IOPAMIDOL (ISOVUE-300) INJECTION 61%  COMPARISON:  CT 03/23/2008  FINDINGS: Lower chest: Lung bases are clear.  Hepatobiliary: No focal hepatic lesion. No biliary duct dilatation. Gallbladder is normal. Common bile duct is normal.  Pancreas: Pancreas is normal. No ductal dilatation. No pancreatic inflammation.  Spleen: Normal spleen  Adrenals/urinary tract:  Adrenal glands are normal. No nephrolithiasis ureterolithiasis. No enhancing renal cortical lesion. No filling defects within collecting systems or ureters. No bladder calculi, enhancing bladder lesions, or filling defect within the bladder.  Stomach/Bowel: Stomach, small bowel, appendix, and cecum are normal. The colon and rectosigmoid colon are normal.  Vascular/Lymphatic: Abdominal aorta is normal caliber with atherosclerotic calcification. There is no retroperitoneal or periportal lymphadenopathy. No pelvic lymphadenopathy.  Reproductive: Prostate normal  Other: No free fluid.  Musculoskeletal: No aggressive osseous lesion.  IMPRESSION: 1. No explanation for hematuria. No nephrolithiasis, ureterolithiasis, enhancing renal cortical lesion, or filling defects within the collecting systems. 2. No bladder stones or filling defects in the bladder which  does not excluded a bladder lesion.  Electronically Signed   By: Suzy Bouchard M.D.   On: 11/26/2016 09:22  Complexity Note: Imaging results reviewed. Results shared with Mr. Fadeley, using Layman's terms.                               Meds   Current Outpatient Medications:  .  amLODipine (NORVASC) 10 MG tablet, Take 1 tablet (10 mg total) by mouth daily., Disp: 90 tablet, Rfl: 1 .  hydrochlorothiazide (HYDRODIURIL) 25 MG tablet, Take 0.5 tablets (12.5 mg total) by mouth daily., Disp: 45 tablet, Rfl: 1 .  latanoprost (XALATAN) 0.005 % ophthalmic solution, Place into both eyes daily. , Disp: , Rfl:  .  amLODipine (NORVASC) 5 MG tablet, TAKE 1 TABLET BY MOUTH DAILY (Patient not taking: Reported on 06/26/2019), Disp: 90 tablet, Rfl: 0 .  gabapentin (NEURONTIN) 300 MG capsule, Take 1 capsule (300 mg total) by mouth 4 (four) times daily., Disp: 120 capsule, Rfl: 5 .  traMADol (ULTRAM) 50 MG tablet, Take 1 tablet (50 mg total) by mouth every 6 (six) hours as needed for severe pain., Disp: 120 tablet, Rfl: 5  ROS  Constitutional:  Denies any fever or chills Gastrointestinal: No reported hemesis, hematochezia, vomiting, or acute GI distress Musculoskeletal: Denies any acute onset joint swelling, redness, loss of ROM, or weakness Neurological: No reported episodes of acute onset apraxia, aphasia, dysarthria, agnosia, amnesia, paralysis, loss of coordination, or loss of consciousness  Allergies  Mr. Dominic has No Known Allergies.  PFSH  Drug: Mr. Clinkscale  has no history on file for drug. Alcohol:  reports current alcohol use. Tobacco:  reports that he has never smoked. He has never used smokeless tobacco. 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 Surgical: Mr. Maher  has a past surgical history that includes Back surgery (02/2009). Family: family history is not on file.  Constitutional Exam  General appearance: Well nourished, well developed, and well hydrated. In no apparent acute distress Vitals:   06/26/19 1415  BP: (!) 154/88  Pulse: 73  Resp: 18  Temp: 97.7 F (36.5 C)  SpO2: 100%  Weight: 190 lb (86.2 kg)  Height: 5' 11"  (1.803 m)   BMI Assessment: Estimated body mass index is 26.5 kg/m as calculated from the following:   Height as of this encounter: 5' 11"  (1.803 m).   Weight as of this encounter: 190 lb (86.2 kg).  BMI interpretation table: BMI level Category Range association with higher incidence of chronic pain  <18 kg/m2 Underweight   18.5-24.9 kg/m2 Ideal body weight   25-29.9 kg/m2 Overweight Increased incidence by 20%  30-34.9 kg/m2 Obese (Class I) Increased incidence by 68%  35-39.9 kg/m2 Severe obesity (Class II) Increased incidence by 136%  >40 kg/m2 Extreme obesity (Class III) Increased incidence by 254%   Patient's current BMI Ideal Body weight  Body mass index is 26.5 kg/m. Ideal body weight: 75.3 kg (166 lb 0.1 oz) Adjusted ideal body weight: 79.7 kg (175 lb 9.7 oz)   BMI Readings  from Last 4 Encounters:  06/26/19 26.50 kg/m  04/26/19 25.94 kg/m  03/09/19 26.08 kg/m  02/06/19 27.00 kg/m   Wt Readings from Last 4 Encounters:  06/26/19 190 lb (86.2 kg)  04/26/19 186 lb (84.4 kg)  03/09/19 187 lb (84.8 kg)  02/06/19 193 lb 9.6 oz (87.8 kg)  Psych/Mental status: Alert, oriented x 3 (person, place, & time)       Eyes: PERLA Respiratory: No evidence of acute respiratory distress  Cervical Spine Exam  Skin & Axial Inspection: No masses, redness, edema, swelling, or associated skin lesions Alignment: Symmetrical Functional ROM: Unrestricted ROM      Stability: No instability detected Muscle Tone/Strength: Functionally intact. No obvious neuro-muscular anomalies detected. Sensory (Neurological): Unimpaired Palpation: No palpable anomalies              Upper Extremity (UE) Exam    Side: Right upper extremity  Side: Left upper extremity  Skin & Extremity Inspection: Skin color, temperature, and hair growth are WNL. No peripheral edema or cyanosis. No masses, redness, swelling, asymmetry, or associated skin lesions. No contractures.  Skin & Extremity Inspection: Skin color, temperature, and hair growth are WNL. No peripheral edema or cyanosis. No masses, redness, swelling, asymmetry, or associated skin lesions. No contractures.  Functional ROM: Unrestricted ROM          Functional ROM: Unrestricted ROM          Muscle Tone/Strength: Functionally intact. No obvious neuro-muscular anomalies detected.  Muscle Tone/Strength: Functionally intact. No obvious neuro-muscular anomalies detected.  Sensory (Neurological): Unimpaired          Sensory (Neurological): Unimpaired          Palpation: No palpable anomalies              Palpation: No palpable anomalies              Provocative Test(s):  Phalen's test: deferred Tinel's test: deferred Apley's scratch test (touch opposite shoulder):  Action 1 (Across chest): deferred Action 2 (Overhead): deferred Action 3 (LB  reach): deferred   Provocative Test(s):  Phalen's test: deferred Tinel's test: deferred Apley's scratch test (touch opposite shoulder):  Action 1 (Across chest): deferred Action 2 (Overhead): deferred Action 3 (LB reach): deferred    Thoracic Spine Area Exam  Skin & Axial Inspection: No masses, redness, or swelling Alignment: Symmetrical Functional ROM: Unrestricted ROM Stability: No instability detected Muscle Tone/Strength: Functionally intact. No obvious neuro-muscular anomalies detected. Sensory (Neurological): Unimpaired Muscle strength & Tone: No palpable anomalies  Lumbar Exam  Skin & Axial Inspection: No masses, redness, or swelling Alignment: Symmetrical Functional ROM: Unrestricted ROM       Stability: No instability detected Muscle Tone/Strength: Functionally intact. No obvious neuro-muscular anomalies detected. Sensory (Neurological): Unimpaired Palpation: No palpable anomalies       Provocative Tests: Hyperextension/rotation test: deferred today       Lumbar quadrant test (Kemp's test): deferred today       Lateral bending test: deferred today       Patrick's Maneuver: deferred today                   FABER* test: deferred today                   S-I anterior distraction/compression test: deferred today         S-I lateral compression test: deferred today         S-I Thigh-thrust test: deferred today         S-I Gaenslen's test: deferred today         *(Flexion, ABduction and External Rotation)  Gait & Posture Assessment  Ambulation: Unassisted Gait: Relatively normal for age and body habitus Posture: WNL   Lower Extremity Exam    Side: Right lower extremity  Side: Left lower extremity  Stability: No instability observed          Stability: No instability observed          Skin & Extremity Inspection: Skin color, temperature, and hair growth are WNL. No peripheral edema or cyanosis. No masses, redness, swelling, asymmetry, or associated skin lesions. No  contractures.  Skin & Extremity Inspection: Skin color, temperature, and hair growth are WNL. No peripheral edema or cyanosis. No masses, redness, swelling, asymmetry, or associated skin lesions. No contractures.  Functional ROM: Unrestricted ROM                  Functional ROM: Unrestricted ROM                  Muscle Tone/Strength: Functionally intact. No obvious neuro-muscular anomalies detected.  Muscle Tone/Strength: Functionally intact. No obvious neuro-muscular anomalies detected.  Sensory (Neurological): Unimpaired        Sensory (Neurological): Unimpaired        DTR: Patellar: deferred today Achilles: deferred today Plantar: deferred today  DTR: Patellar: deferred today Achilles: deferred today Plantar: deferred today  Palpation: No palpable anomalies  Palpation: No palpable anomalies   Assessment   Status Diagnosis  Controlled Controlled Controlled 1. Chronic pain syndrome   2. Neuropathic pain   3. Neurogenic pain      Updated Problems: No problems updated.  Plan of Care  Pharmacotherapy (Medications Ordered): Meds ordered this encounter  Medications  . traMADol (ULTRAM) 50 MG tablet    Sig: Take 1 tablet (50 mg total) by mouth every 6 (six) hours as needed for severe pain.    Dispense:  120 tablet    Refill:  5    Chronic Pain: STOP Act (Not applicable) Fill 1 day early if closed on refill date. Do not fill until: 06/26/2019. To last until: 12/23/2019. Avoid benzodiazepines within 8 hours of opioids  . gabapentin (NEURONTIN) 300 MG capsule    Sig: Take 1 capsule (300 mg total) by mouth 4 (four) times daily.    Dispense:  120 capsule    Refill:  5    Fill one day early if pharmacy is closed on scheduled refill date. May substitute for generic if available.   Medications administered today: Charlena Cross. Bertoli "[name]" had no medications administered during this visit.  Orders:  No orders of the defined types were placed in this encounter.  Lab Orders  No  laboratory test(s) ordered today   Imaging Orders  No imaging studies ordered today   Referral Orders  No referral(s) requested today   Planned follow-up:   Return in about 6 months (around 12/27/2019) for (F2F), (MM).      Interventional therapies: Planned, scheduled, and/or pending:   Not at this time.   Considering:   None at this time.    Palliative PRN treatment(s):   None at this time.      Recent Visits No visits were found meeting these conditions.  Showing recent visits within past 90 days and meeting all other requirements   Today's Visits Date Type Provider Dept  06/26/19 Office Visit Milinda Pointer, MD [facility]  Showing today's visits and meeting all other requirements   Future Appointments No visits were found meeting these conditions.  Showing future appointments within next 90 days and meeting all other requirements   Primary Care Physician: Margo Common, PA Location: [facility] Note by: Gaspar Cola, MD Date: 06/26/2019; Time: 2:30 PM  Note: This dictation  was prepared with Dragon dictation. Any transcriptional errors that may result from this process are unintentional.

## 2019-06-26 ENCOUNTER — Other Ambulatory Visit: Payer: Self-pay

## 2019-06-26 ENCOUNTER — Encounter: Payer: Self-pay | Admitting: Pain Medicine

## 2019-06-26 ENCOUNTER — Ambulatory Visit: Payer: PRIVATE HEALTH INSURANCE | Attending: Pain Medicine | Admitting: Pain Medicine

## 2019-06-26 VITALS — BP 154/88 | HR 73 | Temp 97.7°F | Resp 18 | Ht 71.0 in | Wt 190.0 lb

## 2019-06-26 DIAGNOSIS — M792 Neuralgia and neuritis, unspecified: Secondary | ICD-10-CM | POA: Diagnosis not present

## 2019-06-26 DIAGNOSIS — G894 Chronic pain syndrome: Secondary | ICD-10-CM | POA: Insufficient documentation

## 2019-06-26 MED ORDER — TRAMADOL HCL 50 MG PO TABS: 50.0000 mg | ORAL_TABLET | Freq: Four times a day (QID) | ORAL | 5 refills | Status: DC | PRN

## 2019-06-26 MED ORDER — GABAPENTIN 300 MG PO CAPS: 300.0000 mg | ORAL_CAPSULE | Freq: Four times a day (QID) | ORAL | 5 refills | Status: DC

## 2019-07-16 ENCOUNTER — Other Ambulatory Visit: Payer: Self-pay | Admitting: Family Medicine

## 2019-07-16 DIAGNOSIS — I10 Essential (primary) hypertension: Secondary | ICD-10-CM

## 2019-07-27 ENCOUNTER — Other Ambulatory Visit: Payer: Self-pay

## 2019-07-27 ENCOUNTER — Encounter: Payer: Self-pay | Admitting: Family Medicine

## 2019-07-27 ENCOUNTER — Ambulatory Visit: Payer: PRIVATE HEALTH INSURANCE | Admitting: Family Medicine

## 2019-07-27 VITALS — BP 120/70 | HR 65 | Temp 97.1°F | Resp 18 | Ht 71.0 in | Wt 185.0 lb

## 2019-07-27 DIAGNOSIS — I10 Essential (primary) hypertension: Secondary | ICD-10-CM

## 2019-07-27 DIAGNOSIS — E782 Mixed hyperlipidemia: Secondary | ICD-10-CM

## 2019-07-27 DIAGNOSIS — G894 Chronic pain syndrome: Secondary | ICD-10-CM | POA: Diagnosis not present

## 2019-07-27 NOTE — Progress Notes (Signed)
Established patient visit  I,[name],acting as a scribe for [employer], [employer].,have documented all relevant documentation on the behalf of [employer], [employer],as directed by  [employer], [employer] while in the presence of [employer], [place].   Patient: Dylan [name]   DOB: 1950-06-18   69 y.o. Male  MRN: [id number] Visit Date: 07/27/2019  Today's healthcare provider: Vernie Murders, PA   Chief Complaint  Patient presents with  . Follow-up  . Hypertension   Subjective    HPI  Hypertension, follow-up  BP Readings from Last 3 Encounters:  07/27/19 120/70  06/26/19 (!) 154/88  04/26/19 (!) 150/78   Wt Readings from Last 3 Encounters:  07/27/19 185 lb (83.9 kg)  06/26/19 190 lb (86.2 kg)  04/26/19 186 lb (84.4 kg)     He was last seen for hypertension 3 months ago.  BP at that visit was 154/88. Management since that visit includes; added HCTZ. He reports good compliance with treatment. He is not having side effects. none He is not exercising. He is adherent to low salt diet.   Outside blood pressures are not checking.  He does not smoke.  Use of agents associated with hypertension: none.   -------------------------------------------------------------------- Lipid/Cholesterol, follow-up  Last Lipid Panel: Lab Results  Component Value Date   CHOL 199 02/06/2019   LDLCALC 128 (H) 02/06/2019   HDL 44 02/06/2019   TRIG 152 (H) 02/06/2019    He was last seen for this 3 months ago.  Management since that visit includes; no changes. He reports good compliance with treatment. He is not having side effects. none  He is following a Regular diet. Current exercise: none  Last metabolic panel Lab Results  Component Value Date   GLUCOSE 100 (H) 12/08/2018   NA 137 12/08/2018   K 3.9 12/08/2018   BUN 17 12/08/2018   CREATININE 0.82 12/08/2018   GFRNONAA >60 12/08/2018   GFRAA >60 12/08/2018   CALCIUM 9.0 12/08/2018   AST 22 12/08/2018   ALT 15  12/08/2018   The 10-year ASCVD risk score Mikey Bussing DC Jr., et al., 2013) is: 17.4%  --------------------------------------------------------------------   Past Medical History:  Diagnosis Date  . 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 Lumbago 01/17/2009  . Pain syndrome, chronic   . Sciatica    Social History   Tobacco Use  . Smoking status: Never Smoker  . Smokeless tobacco: Never Used  Substance Use Topics  . Alcohol use: Yes    Comment: rarely alcohol  . Drug use: Not on file   Family Status  Relation Name Status  . Mother  Deceased at age 40       pased away in her sleep  . Father  Deceased       cause of death unknown   No Known Allergies     Medications: Outpatient Medications Prior to Visit  Medication Sig  . amLODipine (NORVASC) 10 MG tablet Take 1 tablet (10 mg total) by mouth daily.  Marland Kitchen gabapentin (NEURONTIN) 300 MG capsule Take 1 capsule (300 mg total) by mouth 4 (four) times daily.  . hydrochlorothiazide (HYDRODIURIL) 25 MG tablet TAKE ONE-HALF TABLET (12.5 MG TOTAL) BY MOUTH DAILY  . latanoprost (XALATAN) 0.005 % ophthalmic solution Place into both eyes daily.   . traMADol (ULTRAM) 50 MG tablet Take 1 tablet (50 mg total) by mouth every 6 (six) hours as needed for severe pain.  Marland Kitchen amLODipine (NORVASC) 5 MG tablet TAKE 1 TABLET BY  MOUTH DAILY (Patient not taking: Reported on 06/26/2019)   No facility-administered medications prior to visit.    Review of Systems  Constitutional: Negative for appetite change, chills and fever.  Respiratory: Negative for chest tightness, shortness of breath and wheezing.   Cardiovascular: Negative for chest pain and palpitations.  Gastrointestinal: Negative for abdominal pain, nausea and vomiting.    Objective    BP 120/70 (BP Location: Right Arm, Patient Position: Sitting, Cuff Size: Large)   Pulse 65   Temp (!) 97.1 F (36.2 C) (Other (Comment))   Resp 18   Ht 5\' 11"  (1.803 m)   Wt 185 lb (83.9 kg)   SpO2 98%   BMI  25.80 kg/m   Physical Exam Constitutional:      General: He is not in acute distress.    Appearance: He is well-developed.  HENT:     Head: Normocephalic and atraumatic.     Right Ear: Hearing and tympanic membrane normal.     Left Ear: Hearing and tympanic membrane normal.     Nose: Nose normal.  Eyes:     General: Lids are normal. No scleral icterus.       Right eye: No discharge.        Left eye: No discharge.     Conjunctiva/sclera: Conjunctivae normal.  Cardiovascular:     Rate and Rhythm: Normal rate and regular rhythm.  Pulmonary:     Effort: Pulmonary effort is normal. No respiratory distress.  Abdominal:     General: Bowel sounds are normal.     Palpations: Abdomen is soft.  Musculoskeletal:        General: Normal range of motion.     Cervical back: Normal range of motion and neck supple.  Skin:    Findings: No lesion or rash.  Neurological:     Mental Status: He is alert and oriented to person, place, and time.  Psychiatric:        Speech: Speech normal.        Behavior: Behavior normal.        Thought Content: Thought content normal.     No results found for any visits on 07/27/19.  Assessment & Plan     1. Essential hypertension Well controlled without side effects from Amlodipine 10 mg qd and HCTZ 25 mg qd. Will recheck CMP and continue present medications. Follow up in 6 months. pending lab reports. - Comprehensive metabolic panel  2. Mixed hyperlipidemia LDL was 128 and triglycerides 152 in January 2021. Trying to reduce fats and keep weight down (BMI 25.8 today and 5 lbs less than 06-26-19). Continue regular exercise program and recheck CMP with Lipid panel. - Comprehensive metabolic panel - Lipid panel  3. Chronic pain syndrome Chronic neuropathic pain syndromes managed by Dr. 07-05-1981 (pain management clinic) with Tramadol and Gabapentin. Continue routine follow up as scheduled.  No follow-ups on file.      Laban Emperor, PA, have reviewed  all documentation for this visit. The documentation on 07/27/19 for the exam, diagnosis, procedures, and orders are all accurate and complete.    07/29/19, PA  [facility] [phone] (phone) [phone] (fax)  [facility]

## 2019-09-01 LAB — COMPREHENSIVE METABOLIC PANEL
ALT: 12 IU/L (ref 0–44)
AST: 19 IU/L (ref 0–40)
Albumin/Globulin Ratio: 2 (ref 1.2–2.2)
Albumin: 4.5 g/dL (ref 3.8–4.8)
Alkaline Phosphatase: 74 IU/L (ref 48–121)
BUN/Creatinine Ratio: 13 (ref 10–24)
BUN: 12 mg/dL (ref 8–27)
Bilirubin Total: 0.4 mg/dL (ref 0.0–1.2)
CO2: 25 mmol/L (ref 20–29)
Calcium: 9.5 mg/dL (ref 8.6–10.2)
Chloride: 103 mmol/L (ref 96–106)
Creatinine, Ser: 0.9 mg/dL (ref 0.76–1.27)
GFR calc Af Amer: 101 mL/min/{1.73_m2} (ref >59)
GFR calc non Af Amer: 87 mL/min/{1.73_m2} (ref >59)
Globulin, Total: 2.3 g/dL (ref 1.5–4.5)
Glucose: 108 mg/dL — ABNORMAL HIGH (ref 65–99)
Potassium: 4.1 mmol/L (ref 3.5–5.2)
Sodium: 140 mmol/L (ref 134–144)
Total Protein: 6.8 g/dL (ref 6.0–8.5)

## 2019-09-01 LAB — LIPID PANEL
Chol/HDL Ratio: 4.9 ratio (ref 0.0–5.0)
Cholesterol, Total: 231 mg/dL — ABNORMAL HIGH (ref 100–199)
HDL: 47 mg/dL (ref >39)
LDL Chol Calc (NIH): 157 mg/dL — ABNORMAL HIGH (ref 0–99)
Triglycerides: 151 mg/dL — ABNORMAL HIGH (ref 0–149)
VLDL Cholesterol Cal: 27 mg/dL (ref 5–40)

## 2019-09-05 ENCOUNTER — Other Ambulatory Visit: Payer: Self-pay

## 2019-09-05 ENCOUNTER — Telehealth: Payer: Self-pay

## 2019-09-05 MED ORDER — ATORVASTATIN CALCIUM 40 MG PO TABS: 40.0000 mg | ORAL_TABLET | Freq: Every day | ORAL | 3 refills | Status: DC

## 2019-09-05 NOTE — Telephone Encounter (Signed)
Pt. Given results and instructions. Verbalizes understanding. Appointment made for follow up. Please send Atorvastatin to [facility].

## 2019-09-05 NOTE — Telephone Encounter (Signed)
-----   Message from Tamsen Roers, [place] sent at 09/03/2019  4:28 PM EDT ----- Normal kidney, liver and electrolyte levels. Total and LDL cholesterol still high. Recommend Atorvastatin 40 mg qd #90 & 3RF with low fat diet and follow up appointment with fasting labs in 3-4 months.

## 2019-09-05 NOTE — Telephone Encounter (Signed)
LMTCB, [facility] Triage Nurse may give patient results  

## 2019-10-05 ENCOUNTER — Other Ambulatory Visit: Payer: Self-pay | Admitting: Physician Assistant

## 2019-10-05 DIAGNOSIS — I10 Essential (primary) hypertension: Secondary | ICD-10-CM

## 2019-11-05 ENCOUNTER — Ambulatory Visit: Payer: PRIVATE HEALTH INSURANCE | Attending: Internal Medicine

## 2019-11-05 DIAGNOSIS — Z23 Encounter for immunization: Secondary | ICD-10-CM

## 2019-11-05 NOTE — Progress Notes (Signed)
   Covid-19 Vaccination Clinic  Name:  Dylan [name]    MRN: [id number] DOB: [birth year]  11/05/2019  [name] was observed post Covid-19 immunization for 15 minutes without incident. He was provided with Vaccine Information Sheet and instruction to access the V-Safe system.   [name] was instructed to call 911 with any severe reactions post vaccine: Marland Kitchen Difficulty breathing  . Swelling of face and throat  . A fast heartbeat  . A bad rash all over body  . Dizziness and weakness

## 2019-12-18 NOTE — Progress Notes (Signed)
PROVIDER NOTE: Information contained herein reflects review and annotations entered in association with encounter. Interpretation of such information and data should be left to medically-trained personnel. Information provided to patient can be located elsewhere in the medical record under "Patient Instructions". Document created using STT-dictation technology, any transcriptional errors that may result from process are unintentional.    Patient: Dylan [name]  Service Category: E/M  Provider: Gaspar Cola, MD  DOB: December 24, 1950  DOS: 12/19/2019  Specialty: Interventional Pain Management  MRN: [id number]  Setting: Ambulatory outpatient  PCP: Margo Common, PA  Type: Established Patient    Referring Provider: Margo Common, PA  Location: Office  Delivery: Face-to-face     HPI  Dylan [name], a 69 y.o. year old male, is here today because of his Chronic pain syndrome [G89.4]. Dylan [name]'s primary complain today is Testicle Pain (left ) Last encounter: My last encounter with him was on 06/26/2019. Pertinent problems: Dylan [name] has Disorder of male genital organs; Chronic low back pain; Chronic pain syndrome; Neuralgia neuritis, sciatic nerve; Neuropathic pain; Neurogenic pain; Visceral pain; Chronic groin pain (Secondary area of Pain) (Left); Orchialgia (Primary Area of Pain) (Left); and Persistent testicular pain (Left) on their pertinent problem list. Pain Assessment: Severity of Chronic pain is reported as a 1 /10. Location: Scrotum Left/localized. Onset: More than a month ago. Quality: Discomfort, Constant, Dull, Sharp, Burning. Timing: Constant. Modifying factor(s): medicine. Vitals:  height is 5' 10"  (1.778 m) and weight is 185 lb (83.9 kg). His temporal temperature is 97.3 F (36.3 C) (abnormal). His blood pressure is 127/70 and his pulse is 68. His respiration is 16 and oxygen saturation is 100%.   Reason for encounter: medication management.  The patient indicates doing  well with the current medication regimen. No adverse reactions or side effects reported to the medications.   UDS updated today. RTCB: 04/05/2020  Nonopioids transferred 12/19/2019: Gabapentin  Pharmacotherapy Assessment   Analgesic: Tramadol 50 mg, 1 tab PO q6 hrs (200 mg/day) MME/day:20 mg/day.   Monitoring: [place] PMP: PDMP reviewed during this encounter.       Pharmacotherapy: No side-effects or adverse reactions reported. Compliance: No problems identified. Effectiveness: Clinically acceptable.  Janett Billow, RN  12/19/2019  8:10 AM  Sign when Signing Visit Nursing Pain Medication Assessment:  Safety precautions to be maintained throughout the outpatient stay will include: orient to surroundings, keep bed in low position, maintain call bell within reach at all times, provide assistance with transfer out of bed and ambulation.  Medication Inspection Compliance: Pill count conducted under aseptic conditions, in front of the patient. Neither the pills nor the bottle was removed from the patient's sight at any time. Once count was completed pills were immediately returned to the patient in their original bottle.  Medication: Tramadol (Ultram) Pill/Patch Count: 68 of 120 pills remain Pill/Patch Appearance: Markings consistent with prescribed medication Bottle Appearance: Standard pharmacy container. Clearly labeled. Filled Date: 53 / 05 / 2021 Last Medication intake:  Yesterday    UDS:  Summary  Date Value Ref Range Status  12/30/2016 FINAL  Final    Comment:    ==================================================================== TOXASSURE SELECT 13 (MW) ==================================================================== Test                             Result       Flag       Units Drug Present and Declared for Prescription Verification   Tramadol                       >  8197        EXPECTED   ng/mg creat   O-Desmethyltramadol            8007         EXPECTED   ng/mg  creat   N-Desmethyltramadol            782          EXPECTED   ng/mg creat    Source of tramadol is a prescription medication.    O-desmethyltramadol and N-desmethyltramadol are expected    metabolites of tramadol. ==================================================================== Test                      Result    Flag   Units      Ref Range   Creatinine              61               mg/dL      >=20 ==================================================================== Declared Medications:  The flagging and interpretation on this report are based on the  following declared medications.  Unexpected results may arise from  inaccuracies in the declared medications.  **Note: The testing scope of this panel includes these medications:  Tramadol  **Note: The testing scope of this panel does not include following  reported medications:  Gabapentin  Latanoprost ==================================================================== For clinical consultation, please call [phone]. ====================================================================      ROS  Constitutional: Denies any fever or chills Gastrointestinal: No reported hemesis, hematochezia, vomiting, or acute GI distress Musculoskeletal: Denies any acute onset joint swelling, redness, loss of ROM, or weakness Neurological: No reported episodes of acute onset apraxia, aphasia, dysarthria, agnosia, amnesia, paralysis, loss of coordination, or loss of consciousness  Medication Review  amLODipine, atorvastatin, dorzolamide-timolol, gabapentin, hydrochlorothiazide, latanoprost, and traMADol  History Review  Allergy: Dylan [name] has No Known Allergies. Drug: Dylan [name]  has no history on file for drug use. Alcohol:  reports current alcohol use. Tobacco:  reports that he has never smoked. He has never used smokeless tobacco. Social: Dylan [name]  reports that he has never smoked. He has never used smokeless tobacco. He reports  current alcohol use. 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 Surgical: Dylan [name]  has a past surgical history that includes Back surgery (02/2009). Family: family history is not on file.  Laboratory Chemistry Profile   Renal Lab Results  Component Value Date   BUN 12 08/31/2019   CREATININE 0.90 08/31/2019   BCR 13 08/31/2019   GFRAA 101 08/31/2019   GFRNONAA 87 08/31/2019     Hepatic Lab Results  Component Value Date   AST 19 08/31/2019   ALT 12 08/31/2019   ALBUMIN 4.5 08/31/2019   ALKPHOS 74 08/31/2019   HCVAB NEGATIVE 02/28/2009     Electrolytes Lab Results  Component Value Date   NA 140 08/31/2019   K 4.1 08/31/2019   CL 103 08/31/2019   CALCIUM 9.5 08/31/2019   MG 2.5 (H) 12/08/2018   PHOS 3.3 10/14/2011     Bone Lab Results  Component Value Date   VD25OH 25.44 (L) 12/08/2018   25OHVITD1 34 01/19/2016   25OHVITD2 1.2 01/19/2016   25OHVITD3 33 01/19/2016     Inflammation (CRP: Acute Phase) (ESR: Chronic Phase) Lab Results  Component Value Date   CRP 2.0 (H) 12/08/2018   ESRSEDRATE 9 12/08/2018       Note: Above  Lab results reviewed.  Recent Imaging Review  CT ABDOMEN PELVIS W WO CONTRAST CLINICAL DATA:  Gross hematuria  EXAM: CT ABDOMEN AND PELVIS WITHOUT AND WITH CONTRAST  TECHNIQUE: Multidetector CT imaging of the abdomen and pelvis was performed following the standard protocol before and following the bolus administration of intravenous contrast.  CONTRAST:  122m ISOVUE-300 IOPAMIDOL (ISOVUE-300) INJECTION 61%  COMPARISON:  CT 03/23/2008  FINDINGS: Lower chest: Lung bases are clear.  Hepatobiliary: No focal hepatic lesion. No biliary duct dilatation. Gallbladder is normal. Common bile duct is normal.  Pancreas: Pancreas is normal. No ductal dilatation. No pancreatic inflammation.  Spleen: Normal spleen  Adrenals/urinary tract:  Adrenal glands are normal. No nephrolithiasis ureterolithiasis. No enhancing renal cortical lesion. No filling defects within collecting systems or ureters. No bladder calculi, enhancing bladder lesions, or filling defect within the bladder.  Stomach/Bowel: Stomach, small bowel, appendix, and cecum are normal. The colon and rectosigmoid colon are normal.  Vascular/Lymphatic: Abdominal aorta is normal caliber with atherosclerotic calcification. There is no retroperitoneal or periportal lymphadenopathy. No pelvic lymphadenopathy.  Reproductive: Prostate normal  Other: No free fluid.  Musculoskeletal: No aggressive osseous lesion.  IMPRESSION: 1. No explanation for hematuria. No nephrolithiasis, ureterolithiasis, enhancing renal cortical lesion, or filling defects within the collecting systems. 2. No bladder stones or filling defects in the bladder which does not excluded a bladder lesion.  Electronically Signed   By: SSuzy BouchardM.D.   On: 11/26/2016 09:22 Note: Reviewed        Physical Exam  General appearance: Well nourished, well developed, and well hydrated. In no apparent acute distress Mental status: Alert, oriented x 3 (person, place, & time)       Respiratory: No evidence of acute respiratory distress Eyes: PERLA Vitals: BP 127/70 (BP Location: Right Arm, Patient Position: Sitting, Cuff Size: Normal)   Pulse 68   Temp (!) 97.3 F (36.3 C) (Temporal)   Resp 16   Ht 5' 10"  (1.778 m)   Wt 185 lb (83.9 kg)   SpO2 100%   BMI 26.54 kg/m  BMI: Estimated body mass index is 26.54 kg/m as calculated from the following:   Height as of this encounter: 5' 10"  (1.778 m).   Weight as of this encounter: 185 lb (83.9 kg). Ideal: Ideal body weight: 73 kg (160 lb 15 oz) Adjusted ideal body weight: 77.4 kg (170 lb 9 oz)  Assessment   Status Diagnosis  Controlled Controlled Controlled 1. Chronic pain syndrome   2. Orchialgia (Primary Area of Pain) (Left)   3. Chronic  groin pain (Secondary area of Pain) (Left)   4. Neurogenic pain   5. Uncomplicated opioid dependence ([place]   6. Pharmacologic therapy      Updated Problems: Problem  Uncomplicated Opioid Dependence ([facility])    Plan of Care  Problem-specific:  No problem-specific Assessment & Plan notes found for this encounter.  Mr. RELVERT CUMPTONhas a current medication list which includes the following long-term medication(s): amlodipine, atorvastatin, hydrochlorothiazide, [START ON 12/23/2019] gabapentin, and [START ON 01/06/2020] tramadol.  Pharmacotherapy (Medications Ordered): Meds ordered this encounter  Medications  . gabapentin (NEURONTIN) 300 MG capsule    Sig: Take 1 capsule (300 mg total) by mouth 4 (four) times daily.    Dispense:  120 capsule    Refill:  2    Fill one day early if pharmacy is closed on scheduled refill date. May substitute for generic if available.  . traMADol (ULTRAM) 50 MG tablet    Sig:  Take 1 tablet (50 mg total) by mouth every 6 (six) hours as needed for severe pain.    Dispense:  120 tablet    Refill:  2    Chronic Pain: STOP Act (Not applicable) Fill 1 day early if closed on refill date. Avoid benzodiazepines within 8 hours of opioids   Orders:  Orders Placed This Encounter  Procedures  . ToxASSURE Select 13 (MW), Urine    Volume: 30 ml(s). Minimum 3 ml of urine is needed. Document temperature of fresh sample. Indications: Long term (current) use of opiate analgesic (F79.024)    Order Specific Question:   Release to patient    Answer:   Immediate  . Nursing Instructions:    1. Medication Agreement: Please go over agreement with the patient. Have the patient read and sign the agreement. Provide patient with a copy of the signed agreement. 2. Make sure that the patient has completed the ORT (Opioid Risk Tool). 3. Provide the patient with a copy of our "Medicatiion Policy", "Medication Recommendations and Reminders", and "CBD information". 4. Remind the  patient to always bring their medications and medication bottles (even if empty) to all appointments except for procedure appointments.    Scheduling Instructions:     Sign "Medication Agreement", complete "Opioid Risk Tool", inform patient of our practice "Medication Policies" (Pill counts, always bring bottles, except on procedure days).  . Nursing communication    Scheduling Instructions:     Complete/update the opioid risk tool (ORT) questionnaire.  . Nursing Instructions:    1). STAT: UDS required today. 2). Make sure to document all opioids and benzodiazepines taken, including time of last intake. 3). If order is entered on a procedure day, make sure sample is obtained before any medications are administered.   Follow-up plan:   Return in about 4 months (around 04/05/2020) for (F2F), (Med Mgmt).      Interventional therapies: Planned, scheduled, and/or pending:   Not at this time.   Considering:   None at this time.    Palliative PRN treatment(s):   None at this time.     Recent Visits No visits were found meeting these conditions. Showing recent visits within past 90 days and meeting all other requirements Today's Visits Date Type Provider Dept  12/19/19 Office Visit Milinda Pointer, MD [facility]  Showing today's visits and meeting all other requirements Future Appointments No visits were found meeting these conditions. Showing future appointments within next 90 days and meeting all other requirements  I discussed the assessment and treatment plan with the patient. The patient was provided an opportunity to ask questions and all were answered. The patient agreed with the plan and demonstrated an understanding of the instructions.  Patient advised to call back or seek an in-person evaluation if the symptoms or condition worsens.  Duration of encounter: 30 minutes.  Note by: Gaspar Cola, MD Date: 12/19/2019; Time: 8:54 AM

## 2019-12-19 ENCOUNTER — Encounter: Payer: Self-pay | Admitting: Pain Medicine

## 2019-12-19 ENCOUNTER — Ambulatory Visit: Payer: PRIVATE HEALTH INSURANCE | Attending: Pain Medicine | Admitting: Pain Medicine

## 2019-12-19 ENCOUNTER — Other Ambulatory Visit: Payer: Self-pay

## 2019-12-19 VITALS — BP 127/70 | HR 68 | Temp 97.3°F | Resp 16 | Ht 70.0 in | Wt 185.0 lb

## 2019-12-19 DIAGNOSIS — G894 Chronic pain syndrome: Secondary | ICD-10-CM | POA: Insufficient documentation

## 2019-12-19 DIAGNOSIS — G8929 Other chronic pain: Secondary | ICD-10-CM | POA: Diagnosis present

## 2019-12-19 DIAGNOSIS — F1120 Opioid dependence, uncomplicated: Secondary | ICD-10-CM | POA: Insufficient documentation

## 2019-12-19 DIAGNOSIS — R1032 Left lower quadrant pain: Secondary | ICD-10-CM | POA: Insufficient documentation

## 2019-12-19 DIAGNOSIS — N50812 Left testicular pain: Secondary | ICD-10-CM | POA: Insufficient documentation

## 2019-12-19 DIAGNOSIS — Z79899 Other long term (current) drug therapy: Secondary | ICD-10-CM | POA: Diagnosis present

## 2019-12-19 DIAGNOSIS — M792 Neuralgia and neuritis, unspecified: Secondary | ICD-10-CM | POA: Insufficient documentation

## 2019-12-19 MED ORDER — GABAPENTIN 300 MG PO CAPS: 300.0000 mg | ORAL_CAPSULE | Freq: Four times a day (QID) | ORAL | 2 refills | Status: DC

## 2019-12-19 MED ORDER — TRAMADOL HCL 50 MG PO TABS: 50.0000 mg | ORAL_TABLET | Freq: Four times a day (QID) | ORAL | 2 refills | Status: DC | PRN

## 2019-12-19 NOTE — Progress Notes (Signed)
Nursing Pain Medication Assessment:  Safety precautions to be maintained throughout the outpatient stay will include: orient to surroundings, keep bed in low position, maintain call bell within reach at all times, provide assistance with transfer out of bed and ambulation.  Medication Inspection Compliance: Pill count conducted under aseptic conditions, in front of the patient. Neither the pills nor the bottle was removed from the patient's sight at any time. Once count was completed pills were immediately returned to the patient in their original bottle.  Medication: Tramadol (Ultram) Pill/Patch Count: 68 of 120 pills remain Pill/Patch Appearance: Markings consistent with prescribed medication Bottle Appearance: Standard pharmacy container. Clearly labeled. Filled Date: 22 / 05 / 2021 Last Medication intake:  Yesterday

## 2019-12-19 NOTE — Patient Instructions (Signed)
____________________________________________________________________________________________  CBD (cannabidiol) WARNING  Applicable to: All individuals currently taking or considering taking CBD (cannabidiol) and, more important, all patients taking opioid analgesic controlled substances (pain medication). (Example: oxycodone; oxymorphone; hydrocodone; hydromorphone; morphine; methadone; tramadol; tapentadol; fentanyl; buprenorphine; butorphanol; dextromethorphan; meperidine; codeine; etc.)  Legal status: CBD remains a Schedule I drug prohibited for any use. CBD is illegal with one exception. In the [place], CBD has a limited Food and Drug Administration (FDA) approval for the treatment of two specific types of epilepsy disorders. Only one CBD product has been approved by the FDA for this purpose: "Epidiolex". FDA is aware that some companies are marketing products containing cannabis and cannabis-derived compounds in ways that violate the Federal Food, Drug and Cosmetic Act (FD&C Act) and that may put the health and safety of consumers at risk. The FDA, a Federal agency, has not enforced the CBD status since 2018.   Legality: Some manufacturers ship CBD products nationally, which is illegal. Often such products are sold online and are therefore available throughout the country. CBD is openly sold in head shops and health food stores in some states where such sales have not been explicitly legalized. Selling unapproved products with unsubstantiated therapeutic claims is not only a violation of the law, but also can put patients at risk, as these products have not been proven to be safe or effective. Federal illegality makes it difficult to conduct research on CBD.  Reference: "FDA Regulation of Cannabis and Cannabis-Derived Products, Including Cannabidiol (CBD)" -  https://www.fda.gov/news-events/public-health-focus/fda-regulation-cannabis-and-cannabis-derived-products-including-cannabidiol-cbd  Warning: CBD is not FDA approved and has not undergo the same manufacturing controls as prescription drugs.  This means that the purity and safety of available CBD may be questionable. Most of the time, despite manufacturer's claims, it is contaminated with THC (delta-9-tetrahydrocannabinol - the chemical in marijuana responsible for the "HIGH").  When this is the case, the THC contaminant will trigger a positive urine drug screen (UDS) test for Marijuana (carboxy-THC). Because a positive UDS for any illicit substance is a violation of our medication agreement, your opioid analgesics (pain medicine) may be permanently discontinued.  MORE ABOUT CBD  General Information: CBD  is a derivative of the Marijuana (cannabis sativa) plant discovered in 1940. It is one of the 113 identified substances found in Marijuana. It accounts for up to 40% of the plant's extract. As of 2018, preliminary clinical studies on CBD included research for the treatment of anxiety, movement disorders, and pain. CBD is available and consumed in multiple forms, including inhalation of smoke or vapor, as an aerosol spray, and by mouth. It may be supplied as an oil containing CBD, capsules, dried cannabis, or as a liquid solution. CBD is thought not to be as psychoactive as THC (delta-9-tetrahydrocannabinol - the chemical in marijuana responsible for the "HIGH"). Studies suggest that CBD may interact with different biological target receptors in the body, including cannabinoid and other neurotransmitter receptors. As of 2018 the mechanism of action for its biological effects has not been determined.  Side-effects  Adverse reactions: Dry mouth, diarrhea, decreased appetite, fatigue, drowsiness, malaise, weakness, sleep disturbances, and others.  Drug interactions: CBC may interact with other medications  such as blood-thinners. (Last update: 09/08/2019) ____________________________________________________________________________________________   ____________________________________________________________________________________________  Medication Rules  Purpose: To inform patients, and their family members, of our rules and regulations.  Applies to: All patients receiving prescriptions (written or electronic).  Pharmacy of record: Pharmacy where electronic prescriptions will be sent. If written prescriptions are taken to a different pharmacy, please inform   the nursing staff. The pharmacy listed in the electronic medical record should be the one where you would like electronic prescriptions to be sent.  Electronic prescriptions: In compliance with the [place] Strengthen Opioid Misuse Prevention (STOP) Act of 2017 (Session Law 2017-74/H243), effective February 01, 2018, all controlled substances must be electronically prescribed. Calling prescriptions to the pharmacy will cease to exist.  Prescription refills: Only during scheduled appointments. Applies to all prescriptions.  NOTE: The following applies primarily to controlled substances (Opioid* Pain Medications).   Type of encounter (visit): For patients receiving controlled substances, face-to-face visits are required. (Not an option or up to the patient.)  Patient's responsibilities: 1. Pain Pills: Bring all pain pills to every appointment (except for procedure appointments). 2. Pill Bottles: Bring pills in original pharmacy bottle. Always bring the newest bottle. Bring bottle, even if empty. 3. Medication refills: You are responsible for knowing and keeping track of what medications you take and those you need refilled. The day before your appointment: write a list of all prescriptions that need to be refilled. The day of the appointment: give the list to the admitting nurse. Prescriptions will be written only during  appointments. No prescriptions will be written on procedure days. If you forget a medication: it will not be "Called in", "Faxed", or "electronically sent". You will need to get another appointment to get these prescribed. No early refills. Do not call asking to have your prescription filled early. 4. Prescription Accuracy: You are responsible for carefully inspecting your prescriptions before leaving our office. Have the discharge nurse carefully go over each prescription with you, before taking them home. Make sure that your name is accurately spelled, that your address is correct. Check the name and dose of your medication to make sure it is accurate. Check the number of pills, and the written instructions to make sure they are clear and accurate. Make sure that you are given enough medication to last until your next medication refill appointment. 5. Taking Medication: Take medication as prescribed. When it comes to controlled substances, taking less pills or less frequently than prescribed is permitted and encouraged. Never take more pills than instructed. Never take medication more frequently than prescribed.  6. Inform other Doctors: Always inform, all of your healthcare providers, of all the medications you take. 7. Pain Medication from other Providers: You are not allowed to accept any additional pain medication from any other Doctor or Healthcare provider. There are two exceptions to this rule. (see below) In the event that you require additional pain medication, you are responsible for notifying us, as stated below. 8. Medication Agreement: You are responsible for carefully reading and following our Medication Agreement. This must be signed before receiving any prescriptions from our practice. Safely store a copy of your signed Agreement. Violations to the Agreement will result in no further prescriptions. (Additional copies of our Medication Agreement are available upon request.) 9. Laws, Rules,  & Regulations: All patients are expected to follow all Federal and State Laws, Statutes, Rules, & Regulations. Ignorance of the Laws does not constitute a valid excuse.  10. Illegal drugs and Controlled Substances: The use of illegal substances (including, but not limited to marijuana and its derivatives) and/or the illegal use of any controlled substances is strictly prohibited. Violation of this rule may result in the immediate and permanent discontinuation of any and all prescriptions being written by our practice. The use of any illegal substances is prohibited. 11. Adopted CDC guidelines & recommendations: Target dosing   levels will be at or below 60 MME/day. Use of benzodiazepines** is not recommended.  Exceptions: There are only two exceptions to the rule of not receiving pain medications from other Healthcare Providers. 1. Exception #1 (Emergencies): In the event of an emergency (i.e.: accident requiring emergency care), you are allowed to receive additional pain medication. However, you are responsible for: As soon as you are able, call our office [phone], at any time of the day or night, and leave a message stating your name, the date and nature of the emergency, and the name and dose of the medication prescribed. In the event that your call is answered by a member of our staff, make sure to document and save the date, time, and the name of the person that took your information.  2. Exception #2 (Planned Surgery): In the event that you are scheduled by another doctor or dentist to have any type of surgery or procedure, you are allowed (for a period no longer than 30 days), to receive additional pain medication, for the acute post-op pain. However, in this case, you are responsible for picking up a copy of our "Post-op Pain Management for Surgeons" handout, and giving it to your surgeon or dentist. This document is available at our office, and does not require an appointment to obtain it. Simply  go to our office during business hours (Monday-Thursday from 8:00 AM to 4:00 PM) (Friday 8:00 AM to 12:00 Noon) or if you have a scheduled appointment with us, prior to your surgery, and ask for it by name. In addition, you are responsible for: calling our office [phone], at any time of the day or night, and leaving a message stating your name, name of your surgeon, type of surgery, and date of procedure or surgery. Failure to comply with your responsibilities may result in termination of therapy involving the controlled substances.  *Opioid medications include: morphine, codeine, oxycodone, oxymorphone, hydrocodone, hydromorphone, meperidine, tramadol, tapentadol, buprenorphine, fentanyl, methadone. **Benzodiazepine medications include: diazepam (Valium), alprazolam (Xanax), clonazepam (Klonopine), lorazepam (Ativan), clorazepate (Tranxene), chlordiazepoxide (Librium), estazolam (Prosom), oxazepam (Serax), temazepam (Restoril), triazolam (Halcion) (Last updated: 10/09/2019) ____________________________________________________________________________________________   ____________________________________________________________________________________________  Medication Recommendations and Reminders  Applies to: All patients receiving prescriptions (written and/or electronic).  Medication Rules & Regulations: These rules and regulations exist for your safety and that of others. They are not flexible and neither are we. Dismissing or ignoring them will be considered "non-compliance" with medication therapy, resulting in complete and irreversible termination of such therapy. (See document titled "Medication Rules" for more details.) In all conscience, because of safety reasons, we cannot continue providing a therapy where the patient does not follow instructions.  Pharmacy of record:   Definition: This is the pharmacy where your electronic prescriptions will be sent.   We do not endorse any  particular pharmacy, however, we have experienced problems with [facility] not securing enough medication supply for the community.  We do not restrict you in your choice of pharmacy. However, once we write for your prescriptions, we will NOT be re-sending more prescriptions to fix restricted supply problems created by your pharmacy, or your insurance.   The pharmacy listed in the electronic medical record should be the one where you want electronic prescriptions to be sent.  If you choose to change pharmacy, simply notify our nursing staff.  Recommendations:  Keep all of your pain medications in a safe place, under lock and key, even if you live alone. We will NOT replace lost, stolen, or   damaged medication.  After you fill your prescription, take 1 week's worth of pills and put them away in a safe place. You should keep a separate, properly labeled bottle for this purpose. The remainder should be kept in the original bottle. Use this as your primary supply, until it runs out. Once it's gone, then you know that you have 1 week's worth of medicine, and it is time to come in for a prescription refill. If you do this correctly, it is unlikely that you will ever run out of medicine.  To make sure that the above recommendation works, it is very important that you make sure your medication refill appointments are scheduled at least 1 week before you run out of medicine. To do this in an effective manner, make sure that you do not leave the office without scheduling your next medication management appointment. Always ask the nursing staff to show you in your prescription , when your medication will be running out. Then arrange for the receptionist to get you a return appointment, at least 7 days before you run out of medicine. Do not wait until you have 1 or 2 pills left, to come in. This is very poor planning and does not take into consideration that we may need to cancel appointments due to bad weather,  sickness, or emergencies affecting our staff.  DO NOT ACCEPT A "Partial Fill": If for any reason your pharmacy does not have enough pills/tablets to completely fill or refill your prescription, do not allow for a "partial fill". The law allows the pharmacy to complete that prescription within 72 hours, without requiring a new prescription. If they do not fill the rest of your prescription within those 72 hours, you will need a separate prescription to fill the remaining amount, which we will NOT provide. If the reason for the partial fill is your insurance, you will need to talk to the pharmacist about payment alternatives for the remaining tablets, but again, DO NOT ACCEPT A PARTIAL FILL, unless you can trust your pharmacist to obtain the remainder of the pills within 72 hours.  Prescription refills and/or changes in medication(s):   Prescription refills, and/or changes in dose or medication, will be conducted only during scheduled medication management appointments. (Applies to both, written and electronic prescriptions.)  No refills on procedure days. No medication will be changed or started on procedure days. No changes, adjustments, and/or refills will be conducted on a procedure day. Doing so will interfere with the diagnostic portion of the procedure.  No phone refills. No medications will be "called into the pharmacy".  No Fax refills.  No weekend refills.  No Holliday refills.  No after hours refills.  Remember:  Business hours are:  Monday to Thursday 8:00 AM to 4:00 PM Provider's Schedule: Aadan Chenier, MD - Appointments are:  Medication management: Monday and Wednesday 8:00 AM to 4:00 PM Procedure day: Tuesday and Thursday 7:30 AM to 4:00 PM Bilal Lateef, MD - Appointments are:  Medication management: Tuesday and Thursday 8:00 AM to 4:00 PM Procedure day: Monday and Wednesday 7:30 AM to 4:00 PM (Last update:  08/22/2019) ____________________________________________________________________________________________    

## 2019-12-19 NOTE — Progress Notes (Signed)
Established patient visit   Patient: Dylan [name]   DOB: 07-May-1950   69 y.o. Male  MRN: [id number] Visit Date: 12/20/2019  Today's healthcare provider: Dortha Kern, PA   Chief Complaint  Patient presents with  . Hyperlipidemia  . Hypertension   Subjective    HPI  Hypertension, follow-up  BP Readings from Last 3 Encounters:  12/20/19 129/74  12/19/19 127/70  07/27/19 120/70   Wt Readings from Last 3 Encounters:  12/20/19 190 lb 6.4 oz (86.4 kg)  12/19/19 185 lb (83.9 kg)  07/27/19 185 lb (83.9 kg)     He was last seen for hypertension 5 months ago.  BP at that visit was 120/70. Management since that visit includes continue same medication.  He reports good compliance with treatment. He is not having side effects.  He is following a Regular diet. He is exercising. He does not smoke.  Use of agents associated with hypertension: none.   Outside blood pressures are not being checked. Symptoms: No chest pain No chest pressure  No palpitations No syncope  No dyspnea No orthopnea  No paroxysmal nocturnal dyspnea No lower extremity edema   Pertinent labs: Lab Results  Component Value Date   CHOL 231 (H) 08/31/2019   HDL 47 08/31/2019   LDLCALC 157 (H) 08/31/2019   TRIG 151 (H) 08/31/2019   CHOLHDL 4.9 08/31/2019   Lab Results  Component Value Date   NA 140 08/31/2019   K 4.1 08/31/2019   CREATININE 0.90 08/31/2019   GFRNONAA 87 08/31/2019   GFRAA 101 08/31/2019   GLUCOSE 108 (H) 08/31/2019     The 10-year ASCVD risk score Denman George DC Jr., et al., 2013) is: 20.6%    Lipid/Cholesterol, Follow-up  Last lipid panel Other pertinent labs  Lab Results  Component Value Date   CHOL 231 (H) 08/31/2019   HDL 47 08/31/2019   LDLCALC 157 (H) 08/31/2019   TRIG 151 (H) 08/31/2019   CHOLHDL 4.9 08/31/2019   Lab Results  Component Value Date   ALT 12 08/31/2019   AST 19 08/31/2019   PLT 281 02/06/2019     He was last seen for this 5 months ago.    Management since that visit includes starting Atorvastatin 40 mg daily with low fat diet.  He reports good compliance with treatment. He is not having side effects.   Symptoms: No chest pain No chest pressure/discomfort  No dyspnea No lower extremity edema  No numbness or tingling of extremity No orthopnea  No palpitations No paroxysmal nocturnal dyspnea  No speech difficulty No syncope   Current diet: well balanced Current exercise: walking  The 10-year ASCVD risk score Denman George DC Montez Hageman., et al., 2013) is: 20.6%  Past Medical History:  Diagnosis Date  . 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 Lumbago 01/17/2009  . Pain syndrome, chronic   . Sciatica    Past Surgical History:  Procedure Laterality Date  . BACK SURGERY  02/2009   ruptured disk   Social History   Tobacco Use  . Smoking status: Never Smoker  . Smokeless tobacco: Never Used  Substance Use Topics  . Alcohol use: Yes    Comment: rarely alcohol  . Drug use: Not on file   No family history on file. No Known Allergies     Medications: Outpatient Medications Prior to Visit  Medication Sig  . amLODipine (NORVASC) 10 MG tablet TAKE ONE TABLET EVERY DAY  . atorvastatin (LIPITOR) 40 MG tablet Take 1  tablet (40 mg total) by mouth daily.  Melene Muller ON 12/23/2019] gabapentin (NEURONTIN) 300 MG capsule Take 1 capsule (300 mg total) by mouth 4 (four) times daily.  . hydrochlorothiazide (HYDRODIURIL) 25 MG tablet TAKE ONE-HALF TABLET (12.5 MG TOTAL) BY MOUTH DAILY  . latanoprost (XALATAN) 0.005 % ophthalmic solution Place into both eyes daily.   Melene Muller ON 01/06/2020] traMADol (ULTRAM) 50 MG tablet Take 1 tablet (50 mg total) by mouth every 6 (six) hours as needed for severe pain.   No facility-administered medications prior to visit.    Review of Systems  Constitutional: Negative for appetite change, chills and fever.  Respiratory: Negative for chest tightness, shortness of breath and wheezing.   Cardiovascular:  Positive for leg swelling. Negative for chest pain and palpitations.  Gastrointestinal: Negative for abdominal pain, nausea and vomiting.      Objective    BP 129/74 (BP Location: Right Arm)   Pulse 61   Resp 16   Ht 5\' 11"  (1.803 m)   Wt 190 lb 6.4 oz (86.4 kg)   BMI 26.56 kg/m    Physical Exam Constitutional:      General: He is not in acute distress.    Appearance: He is well-developed.  HENT:     Head: Normocephalic and atraumatic.     Right Ear: Hearing normal.     Left Ear: Hearing normal.     Nose: Nose normal.  Eyes:     General: Lids are normal. No scleral icterus.       Right eye: No discharge.        Left eye: No discharge.     Conjunctiva/sclera: Conjunctivae normal.  Cardiovascular:     Rate and Rhythm: Normal rate and regular rhythm.     Heart sounds: Normal heart sounds.  Pulmonary:     Effort: Pulmonary effort is normal. No respiratory distress.     Breath sounds: Normal breath sounds.  Abdominal:     General: Bowel sounds are normal.     Palpations: Abdomen is soft.  Musculoskeletal:        General: Normal range of motion.     Cervical back: Normal range of motion.  Skin:    Findings: No lesion or rash.  Neurological:     Mental Status: He is alert and oriented to person, place, and time.  Psychiatric:        Speech: Speech normal.        Behavior: Behavior normal.        Thought Content: Thought content normal.       No results found for any visits on 12/20/19.  Assessment & Plan    1. Essential hypertension Very good BP control with the HCTZ 25 mg qd and Amlodipine 10 mg qd. No dyspnea, chest pains, palpitations or edema. Recheck labs and follow up pending reports. - CBC with Differential/Platelet - Comprehensive metabolic panel - Lipid panel  2. Mixed hyperlipidemia Tolerating the Atorvastatin without side effects. Continue to follow low fat diet and recheck labs. - CBC with Differential/Platelet - Comprehensive metabolic panel -  Lipid panel  3. Need for influenza vaccination - Flu Vaccine QUAD High Dose(Fluad)   No follow-ups on file.      12/22/19, PA, have reviewed all documentation for this visit. The documentation on 12/20/19 for the exam, diagnosis, procedures, and orders are all accurate and complete.    12/22/19, PA  [facility] [phone] (phone) [phone] (fax)  [facility]

## 2019-12-20 ENCOUNTER — Ambulatory Visit: Payer: PRIVATE HEALTH INSURANCE | Admitting: Family Medicine

## 2019-12-20 ENCOUNTER — Encounter: Payer: Self-pay | Admitting: Family Medicine

## 2019-12-20 VITALS — BP 129/74 | HR 61 | Resp 16 | Ht 71.0 in | Wt 190.4 lb

## 2019-12-20 DIAGNOSIS — I10 Essential (primary) hypertension: Secondary | ICD-10-CM | POA: Diagnosis not present

## 2019-12-20 DIAGNOSIS — Z23 Encounter for immunization: Secondary | ICD-10-CM | POA: Diagnosis not present

## 2019-12-20 DIAGNOSIS — E782 Mixed hyperlipidemia: Secondary | ICD-10-CM | POA: Diagnosis not present

## 2019-12-21 LAB — CBC WITH DIFFERENTIAL/PLATELET
Basophils Absolute: 0.1 10*3/uL (ref 0.0–0.2)
Basos: 1 %
EOS (ABSOLUTE): 0.2 10*3/uL (ref 0.0–0.4)
Eos: 4 %
Hematocrit: 38.4 % (ref 37.5–51.0)
Hemoglobin: 12.9 g/dL — ABNORMAL LOW (ref 13.0–17.7)
Immature Grans (Abs): 0 10*3/uL (ref 0.0–0.1)
Immature Granulocytes: 0 %
Lymphocytes Absolute: 1.3 10*3/uL (ref 0.7–3.1)
Lymphs: 26 %
MCH: 29.3 pg (ref 26.6–33.0)
MCHC: 33.6 g/dL (ref 31.5–35.7)
MCV: 87 fL (ref 79–97)
Monocytes Absolute: 0.5 10*3/uL (ref 0.1–0.9)
Monocytes: 10 %
Neutrophils Absolute: 3 10*3/uL (ref 1.4–7.0)
Neutrophils: 59 %
Platelets: 262 10*3/uL (ref 150–450)
RBC: 4.4 x10E6/uL (ref 4.14–5.80)
RDW: 12.4 % (ref 11.6–15.4)
WBC: 5.1 10*3/uL (ref 3.4–10.8)

## 2019-12-21 LAB — LIPID PANEL
Chol/HDL Ratio: 3.5 ratio (ref 0.0–5.0)
Cholesterol, Total: 163 mg/dL (ref 100–199)
HDL: 47 mg/dL (ref >39)
LDL Chol Calc (NIH): 91 mg/dL (ref 0–99)
Triglycerides: 144 mg/dL (ref 0–149)
VLDL Cholesterol Cal: 25 mg/dL (ref 5–40)

## 2019-12-21 LAB — COMPREHENSIVE METABOLIC PANEL
ALT: 17 IU/L (ref 0–44)
AST: 20 IU/L (ref 0–40)
Albumin/Globulin Ratio: 2.1 (ref 1.2–2.2)
Albumin: 4.5 g/dL (ref 3.8–4.8)
Alkaline Phosphatase: 81 IU/L (ref 44–121)
BUN/Creatinine Ratio: 17 (ref 10–24)
BUN: 14 mg/dL (ref 8–27)
Bilirubin Total: 0.7 mg/dL (ref 0.0–1.2)
CO2: 25 mmol/L (ref 20–29)
Calcium: 9.2 mg/dL (ref 8.6–10.2)
Chloride: 100 mmol/L (ref 96–106)
Creatinine, Ser: 0.84 mg/dL (ref 0.76–1.27)
GFR calc Af Amer: 104 mL/min/{1.73_m2} (ref >59)
GFR calc non Af Amer: 90 mL/min/{1.73_m2} (ref >59)
Globulin, Total: 2.1 g/dL (ref 1.5–4.5)
Glucose: 109 mg/dL — ABNORMAL HIGH (ref 65–99)
Potassium: 4.3 mmol/L (ref 3.5–5.2)
Sodium: 140 mmol/L (ref 134–144)
Total Protein: 6.6 g/dL (ref 6.0–8.5)

## 2019-12-26 LAB — TOXASSURE SELECT 13 (MW), URINE

## 2020-01-04 ENCOUNTER — Other Ambulatory Visit: Payer: Self-pay | Admitting: Family Medicine

## 2020-01-04 ENCOUNTER — Other Ambulatory Visit: Payer: Self-pay | Admitting: Physician Assistant

## 2020-01-04 DIAGNOSIS — I10 Essential (primary) hypertension: Secondary | ICD-10-CM

## 2020-04-01 NOTE — Progress Notes (Signed)
PROVIDER NOTE: Information contained herein reflects review and annotations entered in association with encounter. Interpretation of such information and data should be left to medically-trained personnel. Information provided to patient can be located elsewhere in the medical record under "Patient Instructions". Document created using STT-dictation technology, any transcriptional errors that may result from process are unintentional.    Patient: Dylan [name]  Service Category: E/M  Provider: Gaspar Cola, MD  DOB: [birth year]  DOS: 04/02/2020  Specialty: Interventional Pain Management  MRN: [id number]  Setting: Ambulatory outpatient  PCP: Margo Common, PA-C  Type: Established Patient    Referring Provider: Margo Common, PA-C  Location: Office  Delivery: Face-to-face     HPI  [name], a 70 y.o. year old male, is here today because of his Chronic pain syndrome [G89.4]. [name]'s primary complain today is Pain (Left scrotal pain) Last encounter: My last encounter with him was on 12/19/2019. Pertinent problems: 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 Pain Assessment: Severity of Chronic pain is reported as a 1 /10. Location: Scrotum Left/denies. Onset: More than a month ago. Quality: Dull,Aching,Sharp. Timing: Intermittent. Modifying factor(s): medications. Vitals:  height is _0  (1.803 m) and weight is 200 lb (90.7 kg). His temporal temperature is 97.7 F (36.5 C). His blood pressure is 146/77 (abnormal) and his pulse is 65. His respiration is 15 and oxygen saturation is 100%.   Reason for encounter: medication management.   The patient indicates doing well with the  current medication regimen. No adverse reactions or side effects reported to the medications.   RTCB: 07/04/2020 Nonopioids transferred 12/19/2019: Gabapentin  Pharmacotherapy Assessment   Analgesic: Tramadol 50 mg, 1 tab PO q6 hrs (200 mg/day) MME/day:20 mg/day.   Monitoring: [place] PMP: PDMP reviewed during this encounter.       Pharmacotherapy: No side-effects or adverse reactions reported. Compliance: No problems identified. Effectiveness: Clinically acceptable.  Hart Rochester, RN  04/02/2020  8:14 AM  Sign when Signing Visit Nursing Pain Medication Assessment:  Safety precautions to be maintained throughout the outpatient stay will include: orient to surroundings, keep bed in low position, maintain call bell within reach at all times, provide assistance with transfer out of bed and ambulation.  Medication Inspection Compliance: Pill count conducted under aseptic conditions, in front of the patient. Neither the pills nor the bottle was removed from the patient's sight at any time. Once count was completed pills were immediately returned to the patient in their original bottle.  Medication: Tramadol (Ultram) Pill/Patch Count: 76 of 120 pills remain Pill/Patch Appearance: Markings consistent with prescribed medication Bottle Appearance: Standard pharmacy container. Clearly labeled. Filled Date: 02 / 04 / 2022 Last Medication intake:  Yesterday    UDS:  Summary  Date Value Ref Range Status  12/19/2019 Note  Final    Comment:    ==================================================================== [employer] Select 13 (MW) ==================================================================== Test                             Result       Flag       Units  Drug Present and Declared for Prescription Verification   Tramadol                       >  14706       EXPECTED   ng/mg creat   O-Desmethyltramadol            >14706       EXPECTED   ng/mg creat   N-Desmethyltramadol             2256         EXPECTED   ng/mg creat    Source of tramadol is a prescription medication. O-desmethyltramadol    and N-desmethyltramadol are expected metabolites of tramadol.  ==================================================================== Test                      Result    Flag   Units      Ref Range   Creatinine              34               mg/dL      >=20 ==================================================================== Declared Medications:  The flagging and interpretation on this report are based on the  following declared medications.  Unexpected results may arise from  inaccuracies in the declared medications.   **Note: The testing scope of this panel includes these medications:   Tramadol (Ultram)   **Note: The testing scope of this panel does not include the  following reported medications:   Amlodipine (Norvasc)  Atorvastatin (Lipitor)  Gabapentin (Neurontin)  Hydrochlorothiazide  Latanoprost (Xalatan) ==================================================================== For clinical consultation, please call [phone]. ====================================================================      ROS  Constitutional: Denies any fever or chills Gastrointestinal: No reported hemesis, hematochezia, vomiting, or acute GI distress Musculoskeletal: Denies any acute onset joint swelling, redness, loss of ROM, or weakness Neurological: No reported episodes of acute onset apraxia, aphasia, dysarthria, agnosia, amnesia, paralysis, loss of coordination, or loss of consciousness  Medication Review  amLODipine, atorvastatin, gabapentin, hydrochlorothiazide, latanoprost, and traMADol  History Review  Allergy: [name] has No Known Allergies. Drug: [name]  has no history on file for drug use. Alcohol:  reports current alcohol use. Tobacco:  reports that he has never smoked. He has never used smokeless tobacco. Social: [name]  reports that he has never smoked.  He has never used smokeless tobacco. He reports current alcohol use. 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 Surgical: [name]  has a past surgical history that includes Back surgery (02/2009). Family: family history is not on file.  Laboratory Chemistry Profile   Renal Lab Results  Component Value Date   BUN 14 12/20/2019   CREATININE 0.84 12/20/2019   BCR 17 12/20/2019   GFRAA 104 12/20/2019   GFRNONAA 90 12/20/2019     Hepatic Lab Results  Component Value Date   AST 20 12/20/2019   ALT 17 12/20/2019   ALBUMIN 4.5 12/20/2019   ALKPHOS 81 12/20/2019   HCVAB NEGATIVE 02/28/2009     Electrolytes Lab Results  Component Value Date   NA 140 12/20/2019   K 4.3 12/20/2019   CL 100 12/20/2019   CALCIUM 9.2 12/20/2019   MG 2.5 (H) 12/08/2018   PHOS 3.3 10/14/2011     Bone Lab Results  Component Value Date   VD25OH 25.44 (L) 12/08/2018   25OHVITD1 34 01/19/2016   25OHVITD2 1.2 01/19/2016   25OHVITD3 33 01/19/2016     Inflammation (CRP: Acute Phase) (ESR: Chronic Phase) Lab Results  Component Value Date   CRP 2.0 (H) 12/08/2018   ESRSEDRATE 9  12/08/2018       Note: Above Lab results reviewed.  Recent Imaging Review  CT ABDOMEN PELVIS W WO CONTRAST CLINICAL DATA:  Gross hematuria  EXAM: CT ABDOMEN AND PELVIS WITHOUT AND WITH CONTRAST  TECHNIQUE: Multidetector CT imaging of the abdomen and pelvis was performed following the standard protocol before and following the bolus administration of intravenous contrast.  CONTRAST:  143m ISOVUE-300 IOPAMIDOL (ISOVUE-300) INJECTION 61%  COMPARISON:  CT 03/23/2008  FINDINGS: Lower chest: Lung bases are clear.  Hepatobiliary: No focal hepatic lesion. No biliary duct dilatation. Gallbladder is normal. Common bile duct is normal.  Pancreas: Pancreas is normal. No ductal dilatation. No  pancreatic inflammation.  Spleen: Normal spleen  Adrenals/urinary tract: Adrenal glands are normal. No nephrolithiasis ureterolithiasis. No enhancing renal cortical lesion. No filling defects within collecting systems or ureters. No bladder calculi, enhancing bladder lesions, or filling defect within the bladder.  Stomach/Bowel: Stomach, small bowel, appendix, and cecum are normal. The colon and rectosigmoid colon are normal.  Vascular/Lymphatic: Abdominal aorta is normal caliber with atherosclerotic calcification. There is no retroperitoneal or periportal lymphadenopathy. No pelvic lymphadenopathy.  Reproductive: Prostate normal  Other: No free fluid.  Musculoskeletal: No aggressive osseous lesion.  IMPRESSION: 1. No explanation for hematuria. No nephrolithiasis, ureterolithiasis, enhancing renal cortical lesion, or filling defects within the collecting systems. 2. No bladder stones or filling defects in the bladder which does not excluded a bladder lesion.  Electronically Signed   By: SSuzy BouchardM.D.   On: 11/26/2016 09:22 Note: Reviewed        Physical Exam  General appearance: Well nourished, well developed, and well hydrated. In no apparent acute distress Mental status: Alert, oriented x 3 (person, place, & time)       Respiratory: No evidence of acute respiratory distress Eyes: PERLA Vitals: BP (!) 146/77   Pulse 65   Temp 97.7 F (36.5 C) (Temporal)   Resp 15   Ht _0  (1.803 m)   Wt 200 lb (90.7 kg)   SpO2 100%   BMI 27.89 kg/m  BMI: Estimated body mass index is 27.89 kg/m as calculated from the following:   Height as of this encounter: _1  (1.803 m).   Weight as of this encounter: 200 lb (90.7 kg). Ideal: Ideal body weight: 75.3 kg (166 lb 0.1 oz) Adjusted ideal body weight: 81.5 kg (179 lb 9.7 oz)  Assessment   Status Diagnosis  Controlled Controlled Controlled 1. Chronic pain syndrome   2. Orchialgia (Primary Area of Pain) (Left)    3. Chronic groin pain (Secondary area of Pain) (Left)   4. Neurogenic pain   5. Pharmacologic therapy   6. [place]complicated [place]      Updated Problems: No problems updated.  Plan of Care  Problem-specific:  No problem-specific Assessment & Plan notes found for this encounter.  Mr. Dylan CAPOZZIhas a current medication list which includes the following long-term medication(s): amlodipine, atorvastatin, gabapentin, hydrochlorothiazide, and [START ON 04/05/2020] tramadol.  Pharmacotherapy (Medications Ordered): Meds ordered this encounter  Medications  . traMADol (ULTRAM) 50 MG tablet    Sig: Take 1 tablet (50 mg total) by mouth every 6 (six) hours as needed for severe pain.    Dispense:  120 tablet    Refill:  2    Chronic Pain: STOP Act (Not applicable) Fill 1 day early if closed on refill date. Avoid benzodiazepines within 8 hours of opioids   Orders:  No orders of the defined types were  placed in this encounter.  Follow-up plan:   Return in about 3 months (around 07/04/2020) for (F2F), (Med Mgmt).      Interventional therapies: Planned, scheduled, and/or pending:   Not at this time.   Considering:   None at this time.    Palliative PRN treatment(s):   None at this time.     Recent Visits No visits were found meeting these conditions. Showing recent visits within past 90 days and meeting all other requirements Today's Visits Date Type Provider Dept  04/02/20 Office Visit Milinda Pointer, MD [facility]  Showing today's visits and meeting all other requirements Future Appointments No visits were found meeting these conditions. Showing future appointments within next 90 days and meeting all other requirements  I discussed the assessment and treatment plan with the patient. The patient was provided an opportunity to ask questions and all were answered. The patient agreed with the plan and demonstrated an understanding of the  instructions.  Patient advised to call back or seek an in-person evaluation if the symptoms or condition worsens.  Duration of encounter: 25 minutes.  Note by: Gaspar Cola, MD Date: 04/02/2020; Time: 8:19 AM

## 2020-04-02 ENCOUNTER — Other Ambulatory Visit: Payer: Self-pay

## 2020-04-02 ENCOUNTER — Encounter: Payer: Self-pay | Admitting: Pain Medicine

## 2020-04-02 ENCOUNTER — Ambulatory Visit: Payer: PRIVATE HEALTH INSURANCE | Attending: Pain Medicine | Admitting: Pain Medicine

## 2020-04-02 VITALS — BP 146/77 | HR 65 | Temp 97.7°F | Resp 15 | Ht 71.0 in | Wt 200.0 lb

## 2020-04-02 DIAGNOSIS — R1032 Left lower quadrant pain: Secondary | ICD-10-CM | POA: Diagnosis not present

## 2020-04-02 DIAGNOSIS — M792 Neuralgia and neuritis, unspecified: Secondary | ICD-10-CM | POA: Diagnosis not present

## 2020-04-02 DIAGNOSIS — N50812 Left testicular pain: Secondary | ICD-10-CM

## 2020-04-02 DIAGNOSIS — Z79899 Other long term (current) drug therapy: Secondary | ICD-10-CM

## 2020-04-02 DIAGNOSIS — G8929 Other chronic pain: Secondary | ICD-10-CM | POA: Diagnosis present

## 2020-04-02 DIAGNOSIS — G894 Chronic pain syndrome: Secondary | ICD-10-CM

## 2020-04-02 DIAGNOSIS — F1120 Opioid dependence, uncomplicated: Secondary | ICD-10-CM | POA: Diagnosis present

## 2020-04-02 MED ORDER — TRAMADOL HCL 50 MG PO TABS: 50.0000 mg | ORAL_TABLET | Freq: Four times a day (QID) | ORAL | 2 refills | Status: DC | PRN

## 2020-04-02 NOTE — Progress Notes (Signed)
Nursing Pain Medication Assessment:  Safety precautions to be maintained throughout the outpatient stay will include: orient to surroundings, keep bed in low position, maintain call bell within reach at all times, provide assistance with transfer out of bed and ambulation.  Medication Inspection Compliance: Pill count conducted under aseptic conditions, in front of the patient. Neither the pills nor the bottle was removed from the patient's sight at any time. Once count was completed pills were immediately returned to the patient in their original bottle.  Medication: Tramadol (Ultram) Pill/Patch Count: 76 of 120 pills remain Pill/Patch Appearance: Markings consistent with prescribed medication Bottle Appearance: Standard pharmacy container. Clearly labeled. Filled Date: 02 / 04 / 2022 Last Medication intake:  Yesterday

## 2020-04-02 NOTE — Patient Instructions (Signed)
____________________________________________________________________________________________  Medication Rules  Purpose: To inform patients, and their family members, of our rules and regulations.  Applies to: All patients receiving prescriptions (written or electronic).  Pharmacy of record: Pharmacy where electronic prescriptions will be sent. If written prescriptions are taken to a different pharmacy, please inform the nursing staff. The pharmacy listed in the electronic medical record should be the one where you would like electronic prescriptions to be sent.  Electronic prescriptions: In compliance with the [place] Strengthen Opioid Misuse Prevention (STOP) Act of 2017 (Session Law 2017-74/H243), effective February 01, 2018, all controlled substances must be electronically prescribed. Calling prescriptions to the pharmacy will cease to exist.  Prescription refills: Only during scheduled appointments. Applies to all prescriptions.  NOTE: The following applies primarily to controlled substances (Opioid* Pain Medications).   Type of encounter (visit): For patients receiving controlled substances, face-to-face visits are required. (Not an option or up to the patient.)  Patient's responsibilities: 1. Pain Pills: Bring all pain pills to every appointment (except for procedure appointments). 2. Pill Bottles: Bring pills in original pharmacy bottle. Always bring the newest bottle. Bring bottle, even if empty. 3. Medication refills: You are responsible for knowing and keeping track of what medications you take and those you need refilled. The day before your appointment: write a list of all prescriptions that need to be refilled. The day of the appointment: give the list to the admitting nurse. Prescriptions will be written only during appointments. No prescriptions will be written on procedure days. If you forget a medication: it will not be "Called in", "Faxed", or "electronically sent".  You will need to get another appointment to get these prescribed. No early refills. Do not call asking to have your prescription filled early. 4. Prescription Accuracy: You are responsible for carefully inspecting your prescriptions before leaving our office. Have the discharge nurse carefully go over each prescription with you, before taking them home. Make sure that your name is accurately spelled, that your address is correct. Check the name and dose of your medication to make sure it is accurate. Check the number of pills, and the written instructions to make sure they are clear and accurate. Make sure that you are given enough medication to last until your next medication refill appointment. 5. Taking Medication: Take medication as prescribed. When it comes to controlled substances, taking less pills or less frequently than prescribed is permitted and encouraged. Never take more pills than instructed. Never take medication more frequently than prescribed.  6. Inform other Doctors: Always inform, all of your healthcare providers, of all the medications you take. 7. Pain Medication from other Providers: You are not allowed to accept any additional pain medication from any other Doctor or Healthcare provider. There are two exceptions to this rule. (see below) In the event that you require additional pain medication, you are responsible for notifying us, as stated below. 8. Cough Medicine: Often these contain an opioid, such as codeine or hydrocodone. Never accept or take cough medicine containing these opioids if you are already taking an opioid* medication. The combination may cause respiratory failure and death. 9. Medication Agreement: You are responsible for carefully reading and following our Medication Agreement. This must be signed before receiving any prescriptions from our practice. Safely store a copy of your signed Agreement. Violations to the Agreement will result in no further prescriptions.  (Additional copies of our Medication Agreement are available upon request.) 10. Laws, Rules, & Regulations: All patients are expected to follow all   Federal and State Laws, Statutes, Rules, & Regulations. Ignorance of the Laws does not constitute a valid excuse.  11. Illegal drugs and Controlled Substances: The use of illegal substances (including, but not limited to marijuana and its derivatives) and/or the illegal use of any controlled substances is strictly prohibited. Violation of this rule may result in the immediate and permanent discontinuation of any and all prescriptions being written by our practice. The use of any illegal substances is prohibited. 12. Adopted CDC guidelines & recommendations: Target dosing levels will be at or below 60 MME/day. Use of benzodiazepines** is not recommended.  Exceptions: There are only two exceptions to the rule of not receiving pain medications from other Healthcare Providers. 1. Exception #1 (Emergencies): In the event of an emergency (i.e.: accident requiring emergency care), you are allowed to receive additional pain medication. However, you are responsible for: As soon as you are able, call our office [phone], at any time of the day or night, and leave a message stating your name, the date and nature of the emergency, and the name and dose of the medication prescribed. In the event that your call is answered by a member of our staff, make sure to document and save the date, time, and the name of the person that took your information.  2. Exception #2 (Planned Surgery): In the event that you are scheduled by another doctor or dentist to have any type of surgery or procedure, you are allowed (for a period no longer than 30 days), to receive additional pain medication, for the acute post-op pain. However, in this case, you are responsible for picking up a copy of our "Post-op Pain Management for Surgeons" handout, and giving it to your surgeon or dentist. This  document is available at our office, and does not require an appointment to obtain it. Simply go to our office during business hours (Monday-Thursday from 8:00 AM to 4:00 PM) (Friday 8:00 AM to 12:00 Noon) or if you have a scheduled appointment with us, prior to your surgery, and ask for it by name. In addition, you are responsible for: calling our office [phone], at any time of the day or night, and leaving a message stating your name, name of your surgeon, type of surgery, and date of procedure or surgery. Failure to comply with your responsibilities may result in termination of therapy involving the controlled substances.  *Opioid medications include: morphine, codeine, oxycodone, oxymorphone, hydrocodone, hydromorphone, meperidine, tramadol, tapentadol, buprenorphine, fentanyl, methadone. **Benzodiazepine medications include: diazepam (Valium), alprazolam (Xanax), clonazepam (Klonopine), lorazepam (Ativan), clorazepate (Tranxene), chlordiazepoxide (Librium), estazolam (Prosom), oxazepam (Serax), temazepam (Restoril), triazolam (Halcion) (Last updated: 12/31/2019) ____________________________________________________________________________________________   ____________________________________________________________________________________________  Medication Recommendations and Reminders  Applies to: All patients receiving prescriptions (written and/or electronic).  Medication Rules & Regulations: These rules and regulations exist for your safety and that of others. They are not flexible and neither are we. Dismissing or ignoring them will be considered "non-compliance" with medication therapy, resulting in complete and irreversible termination of such therapy. (See document titled "Medication Rules" for more details.) In all conscience, because of safety reasons, we cannot continue providing a therapy where the patient does not follow instructions.  Pharmacy of record:   Definition:  This is the pharmacy where your electronic prescriptions will be sent.   We do not endorse any particular pharmacy, however, we have experienced problems with [facility] not securing enough medication supply for the community.  We do not restrict you in your choice of pharmacy. However,   once we write for your prescriptions, we will NOT be re-sending more prescriptions to fix restricted supply problems created by your pharmacy, or your insurance.   The pharmacy listed in the electronic medical record should be the one where you want electronic prescriptions to be sent.  If you choose to change pharmacy, simply notify our nursing staff.  Recommendations:  Keep all of your pain medications in a safe place, under lock and key, even if you live alone. We will NOT replace lost, stolen, or damaged medication.  After you fill your prescription, take 1 week's worth of pills and put them away in a safe place. You should keep a separate, properly labeled bottle for this purpose. The remainder should be kept in the original bottle. Use this as your primary supply, until it runs out. Once it's gone, then you know that you have 1 week's worth of medicine, and it is time to come in for a prescription refill. If you do this correctly, it is unlikely that you will ever run out of medicine.  To make sure that the above recommendation works, it is very important that you make sure your medication refill appointments are scheduled at least 1 week before you run out of medicine. To do this in an effective manner, make sure that you do not leave the office without scheduling your next medication management appointment. Always ask the nursing staff to show you in your prescription , when your medication will be running out. Then arrange for the receptionist to get you a return appointment, at least 7 days before you run out of medicine. Do not wait until you have 1 or 2 pills left, to come in. This is very poor planning and  does not take into consideration that we may need to cancel appointments due to bad weather, sickness, or emergencies affecting our staff.  DO NOT ACCEPT A "Partial Fill": If for any reason your pharmacy does not have enough pills/tablets to completely fill or refill your prescription, do not allow for a "partial fill". The law allows the pharmacy to complete that prescription within 72 hours, without requiring a new prescription. If they do not fill the rest of your prescription within those 72 hours, you will need a separate prescription to fill the remaining amount, which we will NOT provide. If the reason for the partial fill is your insurance, you will need to talk to the pharmacist about payment alternatives for the remaining tablets, but again, DO NOT ACCEPT A PARTIAL FILL, unless you can trust your pharmacist to obtain the remainder of the pills within 72 hours.  Prescription refills and/or changes in medication(s):   Prescription refills, and/or changes in dose or medication, will be conducted only during scheduled medication management appointments. (Applies to both, written and electronic prescriptions.)  No refills on procedure days. No medication will be changed or started on procedure days. No changes, adjustments, and/or refills will be conducted on a procedure day. Doing so will interfere with the diagnostic portion of the procedure.  No phone refills. No medications will be "called into the pharmacy".  No Fax refills.  No weekend refills.  No Holliday refills.  No after hours refills.  Remember:  Business hours are:  Monday to Thursday 8:00 AM to 4:00 PM Provider's Schedule: Kianni Lheureux, MD - Appointments are:  Medication management: Monday and Wednesday 8:00 AM to 4:00 PM Procedure day: Tuesday and Thursday 7:30 AM to 4:00 PM Bilal Lateef, MD - Appointments are:    Medication management: Tuesday and Thursday 8:00 AM to 4:00 PM Procedure day: Monday and Wednesday  7:30 AM to 4:00 PM (Last update: 08/22/2019) ____________________________________________________________________________________________   ____________________________________________________________________________________________  CBD (cannabidiol) WARNING  Applicable to: All individuals currently taking or considering taking CBD (cannabidiol) and, more important, all patients taking opioid analgesic controlled substances (pain medication). (Example: oxycodone; oxymorphone; hydrocodone; hydromorphone; morphine; methadone; tramadol; tapentadol; fentanyl; buprenorphine; butorphanol; dextromethorphan; meperidine; codeine; etc.)  Legal status: CBD remains a Schedule I drug prohibited for any use. CBD is illegal with one exception. In the [place], CBD has a limited Food and Drug Administration (FDA) approval for the treatment of two specific types of epilepsy disorders. Only one CBD product has been approved by the FDA for this purpose: "Epidiolex". FDA is aware that some companies are marketing products containing cannabis and cannabis-derived compounds in ways that violate the Federal Food, Drug and Cosmetic Act (FD&C Act) and that may put the health and safety of consumers at risk. The FDA, a Federal agency, has not enforced the CBD status since 2018.   Legality: Some manufacturers ship CBD products nationally, which is illegal. Often such products are sold online and are therefore available throughout the country. CBD is openly sold in head shops and health food stores in some states where such sales have not been explicitly legalized. Selling unapproved products with unsubstantiated therapeutic claims is not only a violation of the law, but also can put patients at risk, as these products have not been proven to be safe or effective. Federal illegality makes it difficult to conduct research on CBD.  Reference: "FDA Regulation of Cannabis and Cannabis-Derived Products, Including Cannabidiol  (CBD)" - https://www.fda.gov/news-events/public-health-focus/fda-regulation-cannabis-and-cannabis-derived-products-including-cannabidiol-cbd  Warning: CBD is not FDA approved and has not undergo the same manufacturing controls as prescription drugs.  This means that the purity and safety of available CBD may be questionable. Most of the time, despite manufacturer's claims, it is contaminated with THC (delta-9-tetrahydrocannabinol - the chemical in marijuana responsible for the "HIGH").  When this is the case, the THC contaminant will trigger a positive urine drug screen (UDS) test for Marijuana (carboxy-THC). Because a positive UDS for any illicit substance is a violation of our medication agreement, your opioid analgesics (pain medicine) may be permanently discontinued.  MORE ABOUT CBD  General Information: CBD  is a derivative of the Marijuana (cannabis sativa) plant discovered in 1940. It is one of the 113 identified substances found in Marijuana. It accounts for up to 40% of the plant's extract. As of 2018, preliminary clinical studies on CBD included research for the treatment of anxiety, movement disorders, and pain. CBD is available and consumed in multiple forms, including inhalation of smoke or vapor, as an aerosol spray, and by mouth. It may be supplied as an oil containing CBD, capsules, dried cannabis, or as a liquid solution. CBD is thought not to be as psychoactive as THC (delta-9-tetrahydrocannabinol - the chemical in marijuana responsible for the "HIGH"). Studies suggest that CBD may interact with different biological target receptors in the body, including cannabinoid and other neurotransmitter receptors. As of 2018 the mechanism of action for its biological effects has not been determined.  Side-effects  Adverse reactions: Dry mouth, diarrhea, decreased appetite, fatigue, drowsiness, malaise, weakness, sleep disturbances, and others.  Drug interactions: CBC may interact with other  medications such as blood-thinners. (Last update: 09/08/2019) ____________________________________________________________________________________________    

## 2020-06-03 ENCOUNTER — Other Ambulatory Visit: Payer: Self-pay | Admitting: Family Medicine

## 2020-06-19 ENCOUNTER — Encounter: Payer: Self-pay | Admitting: Family Medicine

## 2020-06-19 ENCOUNTER — Ambulatory Visit: Payer: PRIVATE HEALTH INSURANCE | Admitting: Family Medicine

## 2020-06-19 ENCOUNTER — Other Ambulatory Visit: Payer: Self-pay

## 2020-06-19 VITALS — BP 137/81 | HR 61 | Ht 71.0 in | Wt 200.0 lb

## 2020-06-19 DIAGNOSIS — E782 Mixed hyperlipidemia: Secondary | ICD-10-CM | POA: Diagnosis not present

## 2020-06-19 DIAGNOSIS — I10 Essential (primary) hypertension: Secondary | ICD-10-CM

## 2020-06-19 NOTE — Progress Notes (Signed)
Established patient visit   Patient: Dylan [name]   DOB: 1950/08/05   70 y.o. Male  MRN: [id number] Visit Date: 06/19/2020  Today's healthcare provider: Dortha Kern, PA-C   Chief Complaint  Patient presents with  . Hypertension  . Hyperlipidemia   Subjective    HPI  Hypertension, follow-up  BP Readings from Last 3 Encounters:  06/19/20 137/81  04/02/20 (!) 146/77  12/20/19 129/74   Wt Readings from Last 3 Encounters:  06/19/20 200 lb (90.7 kg)  04/02/20 200 lb (90.7 kg)  12/20/19 190 lb 6.4 oz (86.4 kg)     He was last seen for hypertension 6 months ago.  BP at that visit was 129/74. Management since that visit includes none.  He reports excellent compliance with treatment. He is not having side effects.  He is following a Regular diet. He is exercising.  He does not smoke.  Use of agents associated with hypertension: none.   Outside blood pressures are na. Symptoms: No chest pain No chest pressure  No palpitations No syncope  No dyspnea No orthopnea  No paroxysmal nocturnal dyspnea No lower extremity edema   Pertinent labs: Lab Results  Component Value Date   CHOL 163 12/20/2019   HDL 47 12/20/2019   LDLCALC 91 12/20/2019   TRIG 144 12/20/2019   CHOLHDL 3.5 12/20/2019   Lab Results  Component Value Date   NA 140 12/20/2019   K 4.3 12/20/2019   CREATININE 0.84 12/20/2019   GFRNONAA 90 12/20/2019   GFRAA 104 12/20/2019   GLUCOSE 109 (H) 12/20/2019     The 10-year ASCVD risk score Denman George DC Jr., et al., 2013) is: 20.2%   --------------------------------------------------------------------------------------------------- Lipid/Cholesterol, Follow-up  Last lipid panel Other pertinent labs  Lab Results  Component Value Date   CHOL 163 12/20/2019   HDL 47 12/20/2019   LDLCALC 91 12/20/2019   TRIG 144 12/20/2019   CHOLHDL 3.5 12/20/2019   Lab Results  Component Value Date   ALT 17 12/20/2019   AST 20 12/20/2019   PLT 262  12/20/2019     He was last seen for this 6 months ago.  Management since that visit includes none.  He reports excellent compliance with treatment. He is not having side effects.   Symptoms: No chest pain No chest pressure/discomfort  No dyspnea No lower extremity edema  No numbness or tingling of extremity No orthopnea  No palpitations No paroxysmal nocturnal dyspnea  No speech difficulty No syncope   Current diet: General - regular Current exercise: walking - some  The 10-year ASCVD risk score Denman George DC Jr., et al., 2013) is: 20.2%  --------------------------------------------------------------------------------------------------- Patient would like to get second covid booster.  Past Medical History:  Diagnosis Date  . 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 Lumbago 01/17/2009  . Pain syndrome, chronic   . Sciatica    Past Surgical History:  Procedure Laterality Date  . BACK SURGERY  02/2009   ruptured disk   Social History   Tobacco Use  . Smoking status: Never Smoker  . Smokeless tobacco: Never Used  Substance Use Topics  . Alcohol use: Yes    Comment: rarely alcohol   No family history on file. No Known Allergies     Medications: Outpatient Medications Prior to Visit  Medication Sig  . amLODipine (NORVASC) 10 MG tablet TAKE ONE TABLET EVERY DAY  . atorvastatin (LIPITOR) 40 MG tablet TAKE ONE TABLET BY MOUTH EVERY DAY  . hydrochlorothiazide (HYDRODIURIL) 25  MG tablet TAKE ONE-HALF TABLET (12.5 MG TOTAL) BY MOUTH DAILY  . latanoprost (XALATAN) 0.005 % ophthalmic solution Place into both eyes daily.   . traMADol (ULTRAM) 50 MG tablet Take 1 tablet (50 mg total) by mouth every 6 (six) hours as needed for severe pain.  Marland Kitchen gabapentin (NEURONTIN) 300 MG capsule Take 1 capsule (300 mg total) by mouth 4 (four) times daily.   No facility-administered medications prior to visit.    Review of Systems     Objective    BP 137/81 (BP Location: Right Arm, Patient  Position: Sitting, Cuff Size: Large)   Pulse 61   Ht 5\' 11"  (1.803 m)   Wt 200 lb (90.7 kg)   SpO2 100%   BMI 27.89 kg/m  BP Readings from Last 3 Encounters:  06/19/20 137/81  04/02/20 (!) 146/77  12/20/19 129/74   Wt Readings from Last 3 Encounters:  06/19/20 200 lb (90.7 kg)  04/02/20 200 lb (90.7 kg)  12/20/19 190 lb 6.4 oz (86.4 kg)      Physical Exam Constitutional:      General: He is not in acute distress.    Appearance: He is well-developed.  HENT:     Head: Normocephalic and atraumatic.     Right Ear: Hearing normal.     Left Ear: Hearing normal.     Nose: Nose normal.  Eyes:     General: Lids are normal. No scleral icterus.       Right eye: No discharge.        Left eye: No discharge.     Conjunctiva/sclera: Conjunctivae normal.  Cardiovascular:     Rate and Rhythm: Normal rate and regular rhythm.     Heart sounds: Normal heart sounds.  Pulmonary:     Effort: Pulmonary effort is normal. No respiratory distress.     Breath sounds: Normal breath sounds.  Abdominal:     General: Bowel sounds are normal.     Palpations: Abdomen is soft.  Musculoskeletal:        General: Swelling present. Normal range of motion.     Cervical back: Neck supple.     Comments: Some pitting edema of the left lower leg.   Skin:    Findings: No lesion or rash.  Neurological:     Mental Status: He is alert and oriented to person, place, and time.  Psychiatric:        Speech: Speech normal.        Behavior: Behavior normal.        Thought Content: Thought content normal.       No results found for any visits on 06/19/20.  Assessment & Plan     1. Essential hypertension Good control of BP with HCTZ and Amlodipine. Mild edema of left lower leg noted (may be side effect of Amlodipine). Recheck labs and monitor sodium intake. - CBC with Differential/Platelet - Comprehensive metabolic panel - Lipid panel - TSH  2. Mixed hyperlipidemia Tolerating Atorvastatin 40 mg qd.  Recheck labs. - Comprehensive metabolic panel - Lipid panel - TSH   No follow-ups on file.      I, Hermes Wafer, PA-C, have reviewed all documentation for this visit. The documentation on 06/19/20 for the exam, diagnosis, procedures, and orders are all accurate and complete.    06/21/20, PA-C  Dortha Kern [phone] (phone) [phone] (fax)  [facility]

## 2020-06-20 LAB — CBC WITH DIFFERENTIAL/PLATELET
Basophils Absolute: 0.1 10*3/uL (ref 0.0–0.2)
Basos: 1 %
EOS (ABSOLUTE): 0.2 10*3/uL (ref 0.0–0.4)
Eos: 3 %
Hematocrit: 40.9 % (ref 37.5–51.0)
Hemoglobin: 13.9 g/dL (ref 13.0–17.7)
Immature Grans (Abs): 0.1 10*3/uL (ref 0.0–0.1)
Immature Granulocytes: 1 %
Lymphocytes Absolute: 1.3 10*3/uL (ref 0.7–3.1)
Lymphs: 22 %
MCH: 29.6 pg (ref 26.6–33.0)
MCHC: 34 g/dL (ref 31.5–35.7)
MCV: 87 fL (ref 79–97)
Monocytes Absolute: 0.5 10*3/uL (ref 0.1–0.9)
Monocytes: 9 %
Neutrophils Absolute: 3.8 10*3/uL (ref 1.4–7.0)
Neutrophils: 64 %
Platelets: 304 10*3/uL (ref 150–450)
RBC: 4.69 x10E6/uL (ref 4.14–5.80)
RDW: 12.4 % (ref 11.6–15.4)
WBC: 5.9 10*3/uL (ref 3.4–10.8)

## 2020-06-20 LAB — COMPREHENSIVE METABOLIC PANEL
ALT: 16 IU/L (ref 0–44)
AST: 23 IU/L (ref 0–40)
Albumin/Globulin Ratio: 2.8 — ABNORMAL HIGH (ref 1.2–2.2)
Albumin: 5 g/dL — ABNORMAL HIGH (ref 3.8–4.8)
Alkaline Phosphatase: 87 IU/L (ref 44–121)
BUN/Creatinine Ratio: 13 (ref 10–24)
BUN: 12 mg/dL (ref 8–27)
Bilirubin Total: 0.7 mg/dL (ref 0.0–1.2)
CO2: 23 mmol/L (ref 20–29)
Calcium: 9.6 mg/dL (ref 8.6–10.2)
Chloride: 100 mmol/L (ref 96–106)
Creatinine, Ser: 0.89 mg/dL (ref 0.76–1.27)
Globulin, Total: 1.8 g/dL (ref 1.5–4.5)
Glucose: 103 mg/dL — ABNORMAL HIGH (ref 65–99)
Potassium: 4.4 mmol/L (ref 3.5–5.2)
Sodium: 139 mmol/L (ref 134–144)
Total Protein: 6.8 g/dL (ref 6.0–8.5)
eGFR: 93 mL/min/{1.73_m2} (ref >59)

## 2020-06-20 LAB — LIPID PANEL
Chol/HDL Ratio: 3.9 ratio (ref 0.0–5.0)
Cholesterol, Total: 171 mg/dL (ref 100–199)
HDL: 44 mg/dL (ref >39)
LDL Chol Calc (NIH): 97 mg/dL (ref 0–99)
Triglycerides: 176 mg/dL — ABNORMAL HIGH (ref 0–149)
VLDL Cholesterol Cal: 30 mg/dL (ref 5–40)

## 2020-06-20 LAB — TSH: TSH: 1.31 u[IU]/mL (ref 0.450–4.500)

## 2020-06-26 ENCOUNTER — Telehealth: Payer: Self-pay

## 2020-06-26 ENCOUNTER — Other Ambulatory Visit: Payer: Self-pay | Admitting: Family Medicine

## 2020-06-26 DIAGNOSIS — I10 Essential (primary) hypertension: Secondary | ICD-10-CM

## 2020-06-26 NOTE — Telephone Encounter (Signed)
[facility] 06/26/2020  PEC please advise pt of lab results when he calls back.   Thanks,   -Vernona Rieger

## 2020-06-26 NOTE — Telephone Encounter (Signed)
-----   Message from Tamsen Roers, PA-C sent at 06/25/2020  4:43 PM EDT ----- All blood tests essentially normal except triglycerides above goal of <150. Continue present medications, low fat diet and regular exercise. Recheck progress in 4 months.

## 2020-06-26 NOTE — Telephone Encounter (Signed)
Requested Prescriptions  Pending Prescriptions Disp Refills  . hydrochlorothiazide (HYDRODIURIL) 25 MG tablet [Pharmacy Med Name: HYDROCHLOROTHIAZIDE 25 MG TAB] 45 tablet 1    Sig: TAKE ONE-HALF TABLET (12.5 MG TOTAL) BY MOUTH DAILY     Cardiovascular: Diuretics - Thiazide Passed - 06/26/2020 12:12 PM      Passed - Ca in normal range and within 360 days    Calcium  Date Value Ref Range Status  06/19/2020 9.6 8.6 - 10.2 mg/dL Final   Calcium, Total  Date Value Ref Range Status  10/14/2011 8.5 8.5 - 10.1 mg/dL Final         Passed - Cr in normal range and within 360 days    Creatinine  Date Value Ref Range Status  10/14/2011 0.80 0.60 - 1.30 mg/dL Final   Creatinine, Ser  Date Value Ref Range Status  06/19/2020 0.89 0.76 - 1.27 mg/dL Final         Passed - K in normal range and within 360 days    Potassium  Date Value Ref Range Status  06/19/2020 4.4 3.5 - 5.2 mmol/L Final  10/14/2011 4.1 3.5 - 5.1 mmol/L Final         Passed - Na in normal range and within 360 days    Sodium  Date Value Ref Range Status  06/19/2020 139 134 - 144 mmol/L Final  10/14/2011 139 136 - 145 mmol/L Final         Passed - Last BP in normal range    BP Readings from Last 1 Encounters:  06/19/20 137/81         Passed - Valid encounter within last 6 months    Recent Outpatient Visits          1 week ago Essential hypertension   [employer], Jodell Cipro, PA-C   6 months ago Essential hypertension   [employer], Jodell Cipro, PA-C   11 months ago Essential hypertension   [employer], Jodell Cipro, PA-C   1 year ago Essential hypertension   [employer], Jodell Cipro, PA-C   1 year ago Essential hypertension   [employer], Adriana M, PA-C             . amLODipine (NORVASC) 10 MG tablet [Pharmacy Med Name: AMLODIPINE BESYLATE 10 MG TAB] 90 tablet 1    Sig: TAKE ONE TABLET EVERY DAY      Cardiovascular:  Calcium Channel Blockers Passed - 06/26/2020 12:12 PM      Passed - Last BP in normal range    BP Readings from Last 1 Encounters:  06/19/20 137/81         Passed - Valid encounter within last 6 months    Recent Outpatient Visits          1 week ago Essential hypertension   [employer], Jodell Cipro, PA-C   6 months ago Essential hypertension   [employer], Jodell Cipro, PA-C   11 months ago Essential hypertension   [employer], Jodell Cipro, PA-C   1 year ago Essential hypertension   [employer], Jodell Cipro, PA-C   1 year ago Essential hypertension   [facility] [place], [place], [place]

## 2020-07-02 ENCOUNTER — Ambulatory Visit: Payer: PRIVATE HEALTH INSURANCE | Attending: Pain Medicine | Admitting: Pain Medicine

## 2020-07-02 ENCOUNTER — Other Ambulatory Visit: Payer: Self-pay

## 2020-07-02 ENCOUNTER — Encounter: Payer: Self-pay | Admitting: Pain Medicine

## 2020-07-02 VITALS — BP 150/82 | HR 68 | Temp 97.1°F | Resp 16 | Ht 71.0 in | Wt 200.0 lb

## 2020-07-02 DIAGNOSIS — Z79891 Long term (current) use of opiate analgesic: Secondary | ICD-10-CM | POA: Insufficient documentation

## 2020-07-02 DIAGNOSIS — G8929 Other chronic pain: Secondary | ICD-10-CM | POA: Diagnosis present

## 2020-07-02 DIAGNOSIS — G894 Chronic pain syndrome: Secondary | ICD-10-CM | POA: Diagnosis not present

## 2020-07-02 DIAGNOSIS — M792 Neuralgia and neuritis, unspecified: Secondary | ICD-10-CM | POA: Diagnosis not present

## 2020-07-02 DIAGNOSIS — R1032 Left lower quadrant pain: Secondary | ICD-10-CM | POA: Diagnosis not present

## 2020-07-02 DIAGNOSIS — N50812 Left testicular pain: Secondary | ICD-10-CM | POA: Diagnosis not present

## 2020-07-02 DIAGNOSIS — Z79899 Other long term (current) drug therapy: Secondary | ICD-10-CM | POA: Insufficient documentation

## 2020-07-02 MED ORDER — TRAMADOL HCL 50 MG PO TABS: 50.0000 mg | ORAL_TABLET | Freq: Four times a day (QID) | ORAL | 2 refills | Status: DC | PRN

## 2020-07-02 NOTE — Progress Notes (Signed)
Nursing Pain Medication Assessment:  Safety precautions to be maintained throughout the outpatient stay will include: orient to surroundings, keep bed in low position, maintain call bell within reach at all times, provide assistance with transfer out of bed and ambulation.  Medication Inspection Compliance: Pill count conducted under aseptic conditions, in front of the patient. Neither the pills nor the bottle was removed from the patient's sight at any time. Once count was completed pills were immediately returned to the patient in their original bottle.  Medication: Tramadol (Ultram) Pill/Patch Count: 18 of 120 pills remain Pill/Patch Appearance: Markings consistent with prescribed medication Bottle Appearance: Standard pharmacy container. Clearly labeled. Filled Date: 04 / 08 / 2022 Last Medication intake:  Today

## 2020-07-02 NOTE — Patient Instructions (Signed)
____________________________________________________________________________________________  Medication Rules  Purpose: To inform patients, and their family members, of our rules and regulations.  Applies to: All patients receiving prescriptions (written or electronic).  Pharmacy of record: Pharmacy where electronic prescriptions will be sent. If written prescriptions are taken to a different pharmacy, please inform the nursing staff. The pharmacy listed in the electronic medical record should be the one where you would like electronic prescriptions to be sent.  Electronic prescriptions: In compliance with the [place] Strengthen Opioid Misuse Prevention (STOP) Act of 2017 (Session Law 2017-74/H243), effective February 01, 2018, all controlled substances must be electronically prescribed. Calling prescriptions to the pharmacy will cease to exist.  Prescription refills: Only during scheduled appointments. Applies to all prescriptions.  NOTE: The following applies primarily to controlled substances (Opioid* Pain Medications).   Type of encounter (visit): For patients receiving controlled substances, face-to-face visits are required. (Not an option or up to the patient.)  Patient's responsibilities: 1. Pain Pills: Bring all pain pills to every appointment (except for procedure appointments). 2. Pill Bottles: Bring pills in original pharmacy bottle. Always bring the newest bottle. Bring bottle, even if empty. 3. Medication refills: You are responsible for knowing and keeping track of what medications you take and those you need refilled. The day before your appointment: write a list of all prescriptions that need to be refilled. The day of the appointment: give the list to the admitting nurse. Prescriptions will be written only during appointments. No prescriptions will be written on procedure days. If you forget a medication: it will not be "Called in", "Faxed", or "electronically sent".  You will need to get another appointment to get these prescribed. No early refills. Do not call asking to have your prescription filled early. 4. Prescription Accuracy: You are responsible for carefully inspecting your prescriptions before leaving our office. Have the discharge nurse carefully go over each prescription with you, before taking them home. Make sure that your name is accurately spelled, that your address is correct. Check the name and dose of your medication to make sure it is accurate. Check the number of pills, and the written instructions to make sure they are clear and accurate. Make sure that you are given enough medication to last until your next medication refill appointment. 5. Taking Medication: Take medication as prescribed. When it comes to controlled substances, taking less pills or less frequently than prescribed is permitted and encouraged. Never take more pills than instructed. Never take medication more frequently than prescribed.  6. Inform other Doctors: Always inform, all of your healthcare providers, of all the medications you take. 7. Pain Medication from other Providers: You are not allowed to accept any additional pain medication from any other Doctor or Healthcare provider. There are two exceptions to this rule. (see below) In the event that you require additional pain medication, you are responsible for notifying us, as stated below. 8. Cough Medicine: Often these contain an opioid, such as codeine or hydrocodone. Never accept or take cough medicine containing these opioids if you are already taking an opioid* medication. The combination may cause respiratory failure and death. 9. Medication Agreement: You are responsible for carefully reading and following our Medication Agreement. This must be signed before receiving any prescriptions from our practice. Safely store a copy of your signed Agreement. Violations to the Agreement will result in no further prescriptions.  (Additional copies of our Medication Agreement are available upon request.) 10. Laws, Rules, & Regulations: All patients are expected to follow all   Federal and State Laws, Statutes, Rules, & Regulations. Ignorance of the Laws does not constitute a valid excuse.  11. Illegal drugs and Controlled Substances: The use of illegal substances (including, but not limited to marijuana and its derivatives) and/or the illegal use of any controlled substances is strictly prohibited. Violation of this rule may result in the immediate and permanent discontinuation of any and all prescriptions being written by our practice. The use of any illegal substances is prohibited. 12. Adopted CDC guidelines & recommendations: Target dosing levels will be at or below 60 MME/day. Use of benzodiazepines** is not recommended.  Exceptions: There are only two exceptions to the rule of not receiving pain medications from other Healthcare Providers. 1. Exception #1 (Emergencies): In the event of an emergency (i.e.: accident requiring emergency care), you are allowed to receive additional pain medication. However, you are responsible for: As soon as you are able, call our office [phone], at any time of the day or night, and leave a message stating your name, the date and nature of the emergency, and the name and dose of the medication prescribed. In the event that your call is answered by a member of our staff, make sure to document and save the date, time, and the name of the person that took your information.  2. Exception #2 (Planned Surgery): In the event that you are scheduled by another doctor or dentist to have any type of surgery or procedure, you are allowed (for a period no longer than 30 days), to receive additional pain medication, for the acute post-op pain. However, in this case, you are responsible for picking up a copy of our "Post-op Pain Management for Surgeons" handout, and giving it to your surgeon or dentist. This  document is available at our office, and does not require an appointment to obtain it. Simply go to our office during business hours (Monday-Thursday from 8:00 AM to 4:00 PM) (Friday 8:00 AM to 12:00 Noon) or if you have a scheduled appointment with us, prior to your surgery, and ask for it by name. In addition, you are responsible for: calling our office [phone], at any time of the day or night, and leaving a message stating your name, name of your surgeon, type of surgery, and date of procedure or surgery. Failure to comply with your responsibilities may result in termination of therapy involving the controlled substances.  *Opioid medications include: morphine, codeine, oxycodone, oxymorphone, hydrocodone, hydromorphone, meperidine, tramadol, tapentadol, buprenorphine, fentanyl, methadone. **Benzodiazepine medications include: diazepam (Valium), alprazolam (Xanax), clonazepam (Klonopine), lorazepam (Ativan), clorazepate (Tranxene), chlordiazepoxide (Librium), estazolam (Prosom), oxazepam (Serax), temazepam (Restoril), triazolam (Halcion) (Last updated: 12/31/2019) ____________________________________________________________________________________________   ____________________________________________________________________________________________  Medication Recommendations and Reminders  Applies to: All patients receiving prescriptions (written and/or electronic).  Medication Rules & Regulations: These rules and regulations exist for your safety and that of others. They are not flexible and neither are we. Dismissing or ignoring them will be considered "non-compliance" with medication therapy, resulting in complete and irreversible termination of such therapy. (See document titled "Medication Rules" for more details.) In all conscience, because of safety reasons, we cannot continue providing a therapy where the patient does not follow instructions.  Pharmacy of record:   Definition:  This is the pharmacy where your electronic prescriptions will be sent.   We do not endorse any particular pharmacy, however, we have experienced problems with [facility] not securing enough medication supply for the community.  We do not restrict you in your choice of pharmacy. However,   once we write for your prescriptions, we will NOT be re-sending more prescriptions to fix restricted supply problems created by your pharmacy, or your insurance.   The pharmacy listed in the electronic medical record should be the one where you want electronic prescriptions to be sent.  If you choose to change pharmacy, simply notify our nursing staff.  Recommendations:  Keep all of your pain medications in a safe place, under lock and key, even if you live alone. We will NOT replace lost, stolen, or damaged medication.  After you fill your prescription, take 1 week's worth of pills and put them away in a safe place. You should keep a separate, properly labeled bottle for this purpose. The remainder should be kept in the original bottle. Use this as your primary supply, until it runs out. Once it's gone, then you know that you have 1 week's worth of medicine, and it is time to come in for a prescription refill. If you do this correctly, it is unlikely that you will ever run out of medicine.  To make sure that the above recommendation works, it is very important that you make sure your medication refill appointments are scheduled at least 1 week before you run out of medicine. To do this in an effective manner, make sure that you do not leave the office without scheduling your next medication management appointment. Always ask the nursing staff to show you in your prescription , when your medication will be running out. Then arrange for the receptionist to get you a return appointment, at least 7 days before you run out of medicine. Do not wait until you have 1 or 2 pills left, to come in. This is very poor planning and  does not take into consideration that we may need to cancel appointments due to bad weather, sickness, or emergencies affecting our staff.  DO NOT ACCEPT A "Partial Fill": If for any reason your pharmacy does not have enough pills/tablets to completely fill or refill your prescription, do not allow for a "partial fill". The law allows the pharmacy to complete that prescription within 72 hours, without requiring a new prescription. If they do not fill the rest of your prescription within those 72 hours, you will need a separate prescription to fill the remaining amount, which we will NOT provide. If the reason for the partial fill is your insurance, you will need to talk to the pharmacist about payment alternatives for the remaining tablets, but again, DO NOT ACCEPT A PARTIAL FILL, unless you can trust your pharmacist to obtain the remainder of the pills within 72 hours.  Prescription refills and/or changes in medication(s):   Prescription refills, and/or changes in dose or medication, will be conducted only during scheduled medication management appointments. (Applies to both, written and electronic prescriptions.)  No refills on procedure days. No medication will be changed or started on procedure days. No changes, adjustments, and/or refills will be conducted on a procedure day. Doing so will interfere with the diagnostic portion of the procedure.  No phone refills. No medications will be "called into the pharmacy".  No Fax refills.  No weekend refills.  No Holliday refills.  No after hours refills.  Remember:  Business hours are:  Monday to Thursday 8:00 AM to 4:00 PM Provider's Schedule: Grisela Mesch, MD - Appointments are:  Medication management: Monday and Wednesday 8:00 AM to 4:00 PM Procedure day: Tuesday and Thursday 7:30 AM to 4:00 PM Bilal Lateef, MD - Appointments are:    Medication management: Tuesday and Thursday 8:00 AM to 4:00 PM Procedure day: Monday and Wednesday  7:30 AM to 4:00 PM (Last update: 08/22/2019) ____________________________________________________________________________________________   ____________________________________________________________________________________________  CBD (cannabidiol) WARNING  Applicable to: All individuals currently taking or considering taking CBD (cannabidiol) and, more important, all patients taking opioid analgesic controlled substances (pain medication). (Example: oxycodone; oxymorphone; hydrocodone; hydromorphone; morphine; methadone; tramadol; tapentadol; fentanyl; buprenorphine; butorphanol; dextromethorphan; meperidine; codeine; etc.)  Legal status: CBD remains a Schedule I drug prohibited for any use. CBD is illegal with one exception. In the [place], CBD has a limited Food and Drug Administration (FDA) approval for the treatment of two specific types of epilepsy disorders. Only one CBD product has been approved by the FDA for this purpose: "Epidiolex". FDA is aware that some companies are marketing products containing cannabis and cannabis-derived compounds in ways that violate the Federal Food, Drug and Cosmetic Act (FD&C Act) and that may put the health and safety of consumers at risk. The FDA, a Federal agency, has not enforced the CBD status since 2018.   Legality: Some manufacturers ship CBD products nationally, which is illegal. Often such products are sold online and are therefore available throughout the country. CBD is openly sold in head shops and health food stores in some states where such sales have not been explicitly legalized. Selling unapproved products with unsubstantiated therapeutic claims is not only a violation of the law, but also can put patients at risk, as these products have not been proven to be safe or effective. Federal illegality makes it difficult to conduct research on CBD.  Reference: "FDA Regulation of Cannabis and Cannabis-Derived Products, Including Cannabidiol  (CBD)" - https://www.fda.gov/news-events/public-health-focus/fda-regulation-cannabis-and-cannabis-derived-products-including-cannabidiol-cbd  Warning: CBD is not FDA approved and has not undergo the same manufacturing controls as prescription drugs.  This means that the purity and safety of available CBD may be questionable. Most of the time, despite manufacturer's claims, it is contaminated with THC (delta-9-tetrahydrocannabinol - the chemical in marijuana responsible for the "HIGH").  When this is the case, the THC contaminant will trigger a positive urine drug screen (UDS) test for Marijuana (carboxy-THC). Because a positive UDS for any illicit substance is a violation of our medication agreement, your opioid analgesics (pain medicine) may be permanently discontinued.  MORE ABOUT CBD  General Information: CBD  is a derivative of the Marijuana (cannabis sativa) plant discovered in 1940. It is one of the 113 identified substances found in Marijuana. It accounts for up to 40% of the plant's extract. As of 2018, preliminary clinical studies on CBD included research for the treatment of anxiety, movement disorders, and pain. CBD is available and consumed in multiple forms, including inhalation of smoke or vapor, as an aerosol spray, and by mouth. It may be supplied as an oil containing CBD, capsules, dried cannabis, or as a liquid solution. CBD is thought not to be as psychoactive as THC (delta-9-tetrahydrocannabinol - the chemical in marijuana responsible for the "HIGH"). Studies suggest that CBD may interact with different biological target receptors in the body, including cannabinoid and other neurotransmitter receptors. As of 2018 the mechanism of action for its biological effects has not been determined.  Side-effects  Adverse reactions: Dry mouth, diarrhea, decreased appetite, fatigue, drowsiness, malaise, weakness, sleep disturbances, and others.  Drug interactions: CBC may interact with other  medications such as blood-thinners. (Last update: 09/08/2019) ____________________________________________________________________________________________   ____________________________________________________________________________________________  Drug Holidays (Slow)  What is a "Drug Holiday"? Drug Holiday: is the name given to the period of time during   which a patient stops taking a medication(s) for the purpose of eliminating tolerance to the drug.  Benefits . Improved effectiveness of opioids. . Decreased opioid dose needed to achieve benefits. . Improved pain with lesser dose.  What is tolerance? Tolerance: is the progressive decreased in effectiveness of a drug due to its repetitive use. With repetitive use, the body gets use to the medication and as a consequence, it loses its effectiveness. This is a common problem seen with opioid pain medications. As a result, a larger dose of the drug is needed to achieve the same effect that used to be obtained with a smaller dose.  How long should a "Drug Holiday" last? You should stay off of the pain medicine for at least 14 consecutive days. (2 weeks)  Should I stop the medicine "cold turkey"? No. You should always coordinate with your Pain Specialist so that he/she can provide you with the correct medication dose to make the transition as smoothly as possible.  How do I stop the medicine? Slowly. You will be instructed to decrease the daily amount of pills that you take by one (1) pill every seven (7) days. This is called a "slow downward taper" of your dose. For example: if you normally take four (4) pills per day, you will be asked to drop this dose to three (3) pills per day for seven (7) days, then to two (2) pills per day for seven (7) days, then to one (1) per day for seven (7) days, and at the end of those last seven (7) days, this is when the "Drug Holiday" would start.   Will I have withdrawals? By doing a "slow downward  taper" like this one, it is unlikely that you will experience any significant withdrawal symptoms. Typically, what triggers withdrawals is the sudden stop of a high dose opioid therapy. Withdrawals can usually be avoided by slowly decreasing the dose over a prolonged period of time. If you do not follow these instructions and decide to stop your medication abruptly, withdrawals may be possible.  What are withdrawals? Withdrawals: refers to the wide range of symptoms that occur after stopping or dramatically reducing opiate drugs after heavy and prolonged use. Withdrawal symptoms do not occur to patients that use low dose opioids, or those who take the medication sporadically. Contrary to benzodiazepine (example: Valium, Xanax, etc.) or alcohol withdrawals ("Delirium Tremens"), opioid withdrawals are not lethal. Withdrawals are the physical manifestation of the body getting rid of the excess receptors.  Expected Symptoms Early symptoms of withdrawal may include: . Agitation . Anxiety . Muscle aches . Increased tearing . Insomnia . Runny nose . Sweating . Yawning  Late symptoms of withdrawal may include: . Abdominal cramping . Diarrhea . Dilated pupils . Goose bumps . Nausea . Vomiting  Will I experience withdrawals? Due to the slow nature of the taper, it is very unlikely that you will experience any.  What is a slow taper? Taper: refers to the gradual decrease in dose.  (Last update: 08/22/2019) ____________________________________________________________________________________________     

## 2020-07-02 NOTE — Progress Notes (Signed)
PROVIDER NOTE: Information contained herein reflects review and annotations entered in association with encounter. Interpretation of such information and data should be left to medically-trained personnel. Information provided to patient can be located elsewhere in the medical record under "Patient Instructions". Document created using STT-dictation technology, any transcriptional errors that may result from process are unintentional.    Patient: Dylan [name]  Service Category: E/M  Provider: Gaspar Cola, MD  DOB: [birth year]  DOS: 07/02/2020  Specialty: Interventional Pain Management  MRN: [id number]  Setting: Ambulatory outpatient  PCP: Margo Common, PA-C  Type: Established Patient    Referring Provider: Margo Common, PA-C  Location: Office  Delivery: Face-to-face     HPI  Mr. BRYNDON CUMBIE, a 70 y.o. year old male, is here today because of his Chronic pain syndrome [G89.4]. Mr. Wesenberg's primary complain today is Pelvic Pain (scrotum) Last encounter: My last encounter with him was on 04/02/2020. Pertinent problems: Mr. 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 Pain Assessment: Severity of Chronic pain is reported as a 1 /10. Location: Scrotum Left/denies. Onset: More than a month ago. Quality: Dull,Throbbing,Sharp. Timing: Intermittent. Modifying factor(s): meds. Vitals:  height is _0  (1.803 m) and weight is 200 lb (90.7 kg). His temperature is 97.1 F (36.2 C) (abnormal). His blood pressure is 150/82 (abnormal) and his pulse is 68. His respiration is 16 and oxygen saturation is 100%.   Reason for encounter: medication management.   The patient indicates doing well with the current  medication regimen. No adverse reactions or side effects reported to the medications.    RTCB: 10/02/2020 Nonopioids transferred 12/19/2019: Gabapentin  Pharmacotherapy Assessment   Analgesic: Tramadol 50 mg, 1 tab PO q6 hrs (200 mg/day) MME/day:20 mg/day.   Monitoring: [place] PMP: PDMP reviewed during this encounter.       Pharmacotherapy: No side-effects or adverse reactions reported. Compliance: No problems identified. Effectiveness: Clinically acceptable.  Ignatius Specking, RN  07/02/2020  8:08 AM  Sign when Signing Visit Nursing Pain Medication Assessment:  Safety precautions to be maintained throughout the outpatient stay will include: orient to surroundings, keep bed in low position, maintain call bell within reach at all times, provide assistance with transfer out of bed and ambulation.  Medication Inspection Compliance: Pill count conducted under aseptic conditions, in front of the patient. Neither the pills nor the bottle was removed from the patient's sight at any time. Once count was completed pills were immediately returned to the patient in their original bottle.  Medication: Tramadol (Ultram) Pill/Patch Count: 18 of 120 pills remain Pill/Patch Appearance: Markings consistent with prescribed medication Bottle Appearance: Standard pharmacy container. Clearly labeled. Filled Date: 04 / 08 / 2022 Last Medication intake:  Today    UDS:  Summary  Date Value Ref Range Status  12/19/2019 Note  Final    Comment:    ==================================================================== [employer] Select 13 (MW) ==================================================================== Test                             Result       Flag       Units  Drug Present and Declared for Prescription Verification   Tramadol                       >  14706       EXPECTED   ng/mg creat   O-Desmethyltramadol            >14706       EXPECTED   ng/mg creat   N-Desmethyltramadol            2256          EXPECTED   ng/mg creat    Source of tramadol is a prescription medication. O-desmethyltramadol    and N-desmethyltramadol are expected metabolites of tramadol.  ==================================================================== Test                      Result    Flag   Units      Ref Range   Creatinine              34               mg/dL      >=20 ==================================================================== Declared Medications:  The flagging and interpretation on this report are based on the  following declared medications.  Unexpected results may arise from  inaccuracies in the declared medications.   **Note: The testing scope of this panel includes these medications:   Tramadol (Ultram)   **Note: The testing scope of this panel does not include the  following reported medications:   Amlodipine (Norvasc)  Atorvastatin (Lipitor)  Gabapentin (Neurontin)  Hydrochlorothiazide  Latanoprost (Xalatan) ==================================================================== For clinical consultation, please call [phone]. ====================================================================      ROS  Constitutional: Denies any fever or chills Gastrointestinal: No reported hemesis, hematochezia, vomiting, or acute GI distress Musculoskeletal: Denies any acute onset joint swelling, redness, loss of ROM, or weakness Neurological: No reported episodes of acute onset apraxia, aphasia, dysarthria, agnosia, amnesia, paralysis, loss of coordination, or loss of consciousness  Medication Review  amLODipine, atorvastatin, gabapentin, hydrochlorothiazide, latanoprost, and traMADol  History Review  Allergy: Mr. Sittner has No Known Allergies. Drug: Mr. Carreon  has no history on file for drug use. Alcohol:  reports current alcohol use. Tobacco:  reports that he has never smoked. He has never used smokeless tobacco. Social: Mr. Christmas  reports that he has never smoked. He has never  used smokeless tobacco. He reports current alcohol use. 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 Surgical: Mr. Tomko  has a past surgical history that includes Back surgery (02/2009). Family: family history is not on file.  Laboratory Chemistry Profile   Renal Lab Results  Component Value Date   BUN 12 06/19/2020   CREATININE 0.89 06/19/2020   BCR 13 06/19/2020   GFRAA 104 12/20/2019   GFRNONAA 90 12/20/2019     Hepatic Lab Results  Component Value Date   AST 23 06/19/2020   ALT 16 06/19/2020   ALBUMIN 5.0 (H) 06/19/2020   ALKPHOS 87 06/19/2020   HCVAB NEGATIVE 02/28/2009     Electrolytes Lab Results  Component Value Date   NA 139 06/19/2020   K 4.4 06/19/2020   CL 100 06/19/2020   CALCIUM 9.6 06/19/2020   MG 2.5 (H) 12/08/2018   PHOS 3.3 10/14/2011     Bone Lab Results  Component Value Date   VD25OH 25.44 (L) 12/08/2018   25OHVITD1 34 01/19/2016   25OHVITD2 1.2 01/19/2016   25OHVITD3 33 01/19/2016     Inflammation (CRP: Acute Phase) (ESR: Chronic Phase) Lab Results  Component Value Date   CRP 2.0 (H) 12/08/2018   ESRSEDRATE  9 12/08/2018       Note: Above Lab results reviewed.  Recent Imaging Review  CT ABDOMEN PELVIS W WO CONTRAST CLINICAL DATA:  Gross hematuria  EXAM: CT ABDOMEN AND PELVIS WITHOUT AND WITH CONTRAST  TECHNIQUE: Multidetector CT imaging of the abdomen and pelvis was performed following the standard protocol before and following the bolus administration of intravenous contrast.  CONTRAST:  155m ISOVUE-300 IOPAMIDOL (ISOVUE-300) INJECTION 61%  COMPARISON:  CT 03/23/2008  FINDINGS: Lower chest: Lung bases are clear.  Hepatobiliary: No focal hepatic lesion. No biliary duct dilatation. Gallbladder is normal. Common bile duct is normal.  Pancreas: Pancreas is normal. No ductal dilatation. No pancreatic inflammation.  Spleen: Normal  spleen  Adrenals/urinary tract: Adrenal glands are normal. No nephrolithiasis ureterolithiasis. No enhancing renal cortical lesion. No filling defects within collecting systems or ureters. No bladder calculi, enhancing bladder lesions, or filling defect within the bladder.  Stomach/Bowel: Stomach, small bowel, appendix, and cecum are normal. The colon and rectosigmoid colon are normal.  Vascular/Lymphatic: Abdominal aorta is normal caliber with atherosclerotic calcification. There is no retroperitoneal or periportal lymphadenopathy. No pelvic lymphadenopathy.  Reproductive: Prostate normal  Other: No free fluid.  Musculoskeletal: No aggressive osseous lesion.  IMPRESSION: 1. No explanation for hematuria. No nephrolithiasis, ureterolithiasis, enhancing renal cortical lesion, or filling defects within the collecting systems. 2. No bladder stones or filling defects in the bladder which does not excluded a bladder lesion.  Electronically Signed   By: SSuzy BouchardM.D.   On: 11/26/2016 09:22 Note: Reviewed        Physical Exam  General appearance: Well nourished, well developed, and well hydrated. In no apparent acute distress Mental status: Alert, oriented x 3 (person, place, & time)       Respiratory: No evidence of acute respiratory distress Eyes: PERLA Vitals: BP (!) 150/82   Pulse 68   Temp (!) 97.1 F (36.2 C)   Resp 16   Ht _0  (1.803 m)   Wt 200 lb (90.7 kg)   SpO2 100%   BMI 27.89 kg/m  BMI: Estimated body mass index is 27.89 kg/m as calculated from the following:   Height as of this encounter: _1  (1.803 m).   Weight as of this encounter: 200 lb (90.7 kg). Ideal: Ideal body weight: 75.3 kg (166 lb 0.1 oz) Adjusted ideal body weight: 81.5 kg (179 lb 9.7 oz)  Assessment   Status Diagnosis  Controlled Controlled Controlled 1. Chronic pain syndrome   2. Orchialgia (Primary Area of Pain) (Left)   3. Chronic groin pain (Secondary area of Pain)  (Left)   4. Neurogenic pain   5. Pharmacologic therapy   6. Chronic use of opiate for therapeutic purpose      Updated Problems: Problem  Chronic Use of Opiate for Therapeutic Purpose    Plan of Care  Problem-specific:  No problem-specific Assessment & Plan notes found for this encounter.  Mr. RDREXLER MALANDhas a current medication list which includes the following long-term medication(s): amlodipine, atorvastatin, hydrochlorothiazide, gabapentin, and [START ON 07/04/2020] tramadol.  Pharmacotherapy (Medications Ordered): Meds ordered this encounter  Medications  . traMADol (ULTRAM) 50 MG tablet    Sig: Take 1 tablet (50 mg total) by mouth every 6 (six) hours as needed for severe pain. Each refill must last 30 days.    Dispense:  120 tablet    Refill:  2    Dispense 1 day early if closed on refill date. Avoid benzodiazepines within 8 hours of  opioids. Do not send refill requests.   Orders:  No orders of the defined types were placed in this encounter.  Follow-up plan:   Return in about 3 months (around 10/02/2020) for evaluation day (F2F) (MM).      Interventional therapies: Planned, scheduled, and/or pending:   Not at this time.   Considering:   None at this time.    Palliative PRN treatment(s):   None at this time.     Recent Visits No visits were found meeting these conditions. Showing recent visits within past 90 days and meeting all other requirements Today's Visits Date Type Provider Dept  07/02/20 Office Visit Milinda Pointer, MD [facility]  Showing today's visits and meeting all other requirements Future Appointments Date Type Provider Dept  09/29/20 Appointment Milinda Pointer, MD [facility]  Showing future appointments within next 90 days and meeting all other requirements  I discussed the assessment and treatment plan with the patient. The patient was provided an opportunity to ask questions and all were answered. The patient  agreed with the plan and demonstrated an understanding of the instructions.  Patient advised to call back or seek an in-person evaluation if the symptoms or condition worsens.  Duration of encounter: 30 minutes.  Note by: Gaspar Cola, MD Date: 07/02/2020; Time: 8:47 AM

## 2020-09-14 NOTE — Progress Notes (Signed)
Unsuccessful attempt to contact patient for Virtual Visit (Pain Management Telehealth)   Patient provided contact information:  [phone] (home); There is no such number on file (mobile).; (Preferred) [phone] [email or web address]   Pre-screening:  Our staff was unsuccessful in contacting Dylan [name] using the above provided information.   I unsuccessfully attempted to make contact with Dylan [name] on 09/15/2020 via telephone. I was unable to complete the virtual encounter due to call going directly to voicemail. I was able to leave a message where I clearly identify myself as Oswaldo Done, MD and I left a message to call us back to reschedule the call.  Pharmacotherapy Assessment  Analgesic: Tramadol 50 mg, 1 tab PO q6 hrs (200 mg/day) MME/day: 20 mg/day.   Follow-up plan:   Reschedule Visit.    Interventional therapies: Planned, scheduled, and/or pending:   Not at this time.   Considering:   None at this time.    Palliative PRN treatment(s):   None at this time.      Recent Visits Date Type Provider Dept  07/02/20 Office Visit Delano Metz, MD [facility]  Showing recent visits within past 90 days and meeting all other requirements Today's Visits Date Type Provider Dept  09/15/20 Telemedicine Delano Metz, MD [facility]  Showing today's visits and meeting all other requirements Future Appointments Date Type Provider Dept  10/21/20 Appointment Delano Metz, MD [facility]  Showing future appointments within next 90 days and meeting all other requirements  Note by: Oswaldo Done, MD Date: 09/15/2020; Time: 6:22 PM

## 2020-09-15 ENCOUNTER — Other Ambulatory Visit: Payer: Self-pay

## 2020-09-15 ENCOUNTER — Ambulatory Visit: Payer: PRIVATE HEALTH INSURANCE | Attending: Pain Medicine | Admitting: Pain Medicine

## 2020-09-15 DIAGNOSIS — Z5329 Procedure and treatment not carried out because of patient's decision for other reasons: Secondary | ICD-10-CM

## 2020-09-15 DIAGNOSIS — Z91199 Patient's noncompliance with other medical treatment and regimen due to unspecified reason: Secondary | ICD-10-CM

## 2020-09-16 ENCOUNTER — Telehealth: Payer: Self-pay

## 2020-09-17 ENCOUNTER — Ambulatory Visit: Payer: PRIVATE HEALTH INSURANCE | Attending: Pain Medicine | Admitting: Pain Medicine

## 2020-09-17 DIAGNOSIS — Z91199 Patient's noncompliance with other medical treatment and regimen due to unspecified reason: Secondary | ICD-10-CM

## 2020-09-17 DIAGNOSIS — Z5329 Procedure and treatment not carried out because of patient's decision for other reasons: Secondary | ICD-10-CM

## 2020-09-17 NOTE — Progress Notes (Signed)
Unsuccessful attempt to contact patient for Virtual Visit (Pain Management Telehealth)   Patient provided contact information:  [phone] (home); [phone] (mobile); (Preferred) [phone] [email or web address]   Pre-screening:  Our staff was successful in contacting Dylan [name] using the above provided information.   I unsuccessfully attempted to make contact with Dylan [name] twice on 09/17/2020 via telephone. I was unable to complete the virtual encounter due to call going directly to voicemail. I was unable to leave a message due to a mailbox that has not been setup.  Pharmacotherapy Assessment  Analgesic: Tramadol 50 mg, 1 tab PO q6 hrs (200 mg/day) MME/day: 20 mg/day.   Follow-up plan:   Reschedule Visit.    Interventional therapies: Planned, scheduled, and/or pending:   Not at this time.   Considering:   None at this time.    Palliative PRN treatment(s):   None at this time.       Recent Visits Date Type Provider Dept  09/15/20 Telemedicine Delano Metz, MD [facility]  07/02/20 Office Visit Delano Metz, MD [facility]  Showing recent visits within past 90 days and meeting all other requirements Today's Visits Date Type Provider Dept  09/17/20 Telemedicine Delano Metz, MD [facility]  Showing today's visits and meeting all other requirements Future Appointments Date Type Provider Dept  10/21/20 Appointment Delano Metz, MD [facility]  Showing future appointments within next 90 days and meeting all other requirements  Note by: Oswaldo Done, MD Date: 09/17/2020; Time: 5:01 PM

## 2020-09-24 ENCOUNTER — Telehealth: Payer: PRIVATE HEALTH INSURANCE | Admitting: Pain Medicine

## 2020-09-29 ENCOUNTER — Encounter: Payer: PRIVATE HEALTH INSURANCE | Admitting: Pain Medicine

## 2020-10-18 NOTE — Progress Notes (Signed)
PROVIDER NOTE: Information contained herein reflects review and annotations entered in association with encounter. Interpretation of such information and data should be left to medically-trained personnel. Information provided to patient can be located elsewhere in the medical record under "Patient Instructions". Document created using STT-dictation technology, any transcriptional errors that may result from process are unintentional.    Patient: Dylan [name]  Service Category: E/M  Provider: Gaspar Cola, MD  DOB: Apr 16, 1950  DOS: 10/21/2020  Specialty: Interventional Pain Management  MRN: [id number]  Setting: Ambulatory outpatient  PCP: Margo Common, PA-C  Type: Established Patient    Referring Provider: Margo Common, PA-C  Location: Office  Delivery: Face-to-face     HPI  [name], a 70 y.o. year old male, is here today because of his Pain in left testicle [N50.812]. [name]'s primary complain today is Groin Pain (left) Last encounter: My last encounter with him was on 07/02/2020. Pertinent problems: 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 Pain Assessment: Severity of Chronic pain is reported as a 0-No pain/10. Location: Groin (left) Left/Denies. Onset: More than a month ago. Quality: Aching, Burning. Timing: Intermittent. Modifying factor(s): meds. Vitals:  height is _0  (1.803 m) and weight is 200 lb (90.7 kg). His temporal temperature is 97.3 F (36.3 C) (abnormal). His blood pressure is 151/93 (abnormal) and his pulse is 73. His respiration is 16 and oxygen saturation is 97%.   Reason for encounter: medication management.   The patient indicates doing well with the  current medication regimen. No adverse reactions or side effects reported to the medications.   UDS ordered today.   RTCB: 04/19/2021 Nonopioids transferred 12/19/2019: Gabapentin  Pharmacotherapy Assessment  Analgesic: Tramadol 50 mg, 1 tab PO q6 hrs (200 mg/day) MME/day: 20 mg/day.   Monitoring: Dylan [name] PMP: PDMP reviewed during this encounter.       Pharmacotherapy: No side-effects or adverse reactions reported. Compliance: No problems identified. Effectiveness: Clinically acceptable.  Chauncey Fischer, RN  10/21/2020  2:05 PM  Sign when Signing Visit Nursing Pain Medication Assessment:  Safety precautions to be maintained throughout the outpatient stay will include: orient to surroundings, keep bed in low position, maintain call bell within reach at all times, provide assistance with transfer out of bed and ambulation.  Medication Inspection Compliance: Pill count conducted under aseptic conditions, in front of the patient. Neither the pills nor the bottle was removed from the patient's sight at any time. Once count was completed pills were immediately returned to the patient in their original bottle.  Medication: Tramadol (Ultram) Pill/Patch Count:  97 of 120 pills remain Pill/Patch Appearance: Markings consistent with prescribed medication Bottle Appearance: Standard pharmacy container. Clearly labeled. Filled Date: 8 / 14 / 2022 Last Medication intake:  Today Safety precautions to be maintained throughout the outpatient stay will include: orient to surroundings, keep bed in low position, maintain call bell within reach at all times, provide assistance with transfer out of bed and ambulation.      UDS:  Summary  Date Value Ref Range Status  12/19/2019 Note  Final    Comment:    ==================================================================== Tox[employer]SU[employer] Select 13 (MW) ==================================================================== Test  Result        Flag       Units  Drug Present and Declared for Prescription Verification   Tramadol                       >14706       EXPECTED   ng/mg creat   O-Desmethyltramadol            >14706       EXPECTED   ng/mg creat   N-Desmethyltramadol            2256         EXPECTED   ng/mg creat    Source of tramadol is a prescription medication. O-desmethyltramadol    and N-desmethyltramadol are expected metabolites of tramadol.  ==================================================================== Test                      Result    Flag   Units      Ref Range   Creatinine              34               mg/dL      >=20 ==================================================================== Declared Medications:  The flagging and interpretation on this report are based on the  following declared medications.  Unexpected results may arise from  inaccuracies in the declared medications.   **Note: The testing scope of this panel includes these medications:   Tramadol (Ultram)   **Note: The testing scope of this panel does not include the  following reported medications:   Amlodipine (Norvasc)  Atorvastatin (Lipitor)  Gabapentin (Neurontin)  Hydrochlorothiazide  Latanoprost (Xalatan) ==================================================================== For clinical consultation, please call [phone]. ====================================================================      ROS  Constitutional: Denies any fever or chills Gastrointestinal: No reported hemesis, hematochezia, vomiting, or acute GI distress Musculoskeletal: Denies any acute onset joint swelling, redness, loss of ROM, or weakness Neurological: No reported episodes of acute onset apraxia, aphasia, dysarthria, agnosia, amnesia, paralysis, loss of coordination, or loss of consciousness  Medication Review  amLODipine, atorvastatin, gabapentin, hydrochlorothiazide, latanoprost, and traMADol  History Review  Allergy: [name] has  No Known Allergies. Drug: [name]  has no history on file for drug use. Alcohol:  reports current alcohol use. Tobacco:  reports that he has never smoked. He has never used smokeless tobacco. Social: Dylan [name]  reports that he has never smoked. He has never used smokeless tobacco. He reports current alcohol use. 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 Surgical: Mr. Kopka  has a past surgical history that includes Back surgery (02/2009). Family: family history is not on file.  Laboratory Chemistry Profile   Renal Lab Results  Component Value Date   BUN 12 06/19/2020   CREATININE 0.89 06/19/2020   BCR 13 06/19/2020   GFRAA 104 12/20/2019   GFRNONAA 90 12/20/2019    Hepatic Lab Results  Component Value Date   AST 23 06/19/2020   ALT 16 06/19/2020   ALBUMIN 5.0 (H) 06/19/2020   ALKPHOS 87 06/19/2020   HCVAB NEGATIVE 02/28/2009    Electrolytes Lab Results  Component Value Date   NA 139 06/19/2020   K 4.4 06/19/2020   CL 100 06/19/2020   CALCIUM 9.6 06/19/2020   MG 2.5 (H) 12/08/2018   PHOS 3.3 10/14/2011    Bone Lab Results  Component Value Date  VD25OH 25.44 (L) 12/08/2018   25OHVITD1 34 01/19/2016   25OHVITD2 1.2 01/19/2016   25OHVITD3 33 01/19/2016    Inflammation (CRP: Acute Phase) (ESR: Chronic Phase) Lab Results  Component Value Date   CRP 2.0 (H) 12/08/2018   ESRSEDRATE 9 12/08/2018         Note: Above Lab results reviewed.  Recent Imaging Review  CT ABDOMEN PELVIS W WO CONTRAST CLINICAL DATA:  Gross hematuria  EXAM: CT ABDOMEN AND PELVIS WITHOUT AND WITH CONTRAST  TECHNIQUE: Multidetector CT imaging of the abdomen and pelvis was performed following the standard protocol before and following the bolus administration of intravenous contrast.  CONTRAST:  193m ISOVUE-300 IOPAMIDOL (ISOVUE-300) INJECTION 61%  COMPARISON:  CT  03/23/2008  FINDINGS: Lower chest: Lung bases are clear.  Hepatobiliary: No focal hepatic lesion. No biliary duct dilatation. Gallbladder is normal. Common bile duct is normal.  Pancreas: Pancreas is normal. No ductal dilatation. No pancreatic inflammation.  Spleen: Normal spleen  Adrenals/urinary tract: Adrenal glands are normal. No nephrolithiasis ureterolithiasis. No enhancing renal cortical lesion. No filling defects within collecting systems or ureters. No bladder calculi, enhancing bladder lesions, or filling defect within the bladder.  Stomach/Bowel: Stomach, small bowel, appendix, and cecum are normal. The colon and rectosigmoid colon are normal.  Vascular/Lymphatic: Abdominal aorta is normal caliber with atherosclerotic calcification. There is no retroperitoneal or periportal lymphadenopathy. No pelvic lymphadenopathy.  Reproductive: Prostate normal  Other: No free fluid.  Musculoskeletal: No aggressive osseous lesion.  IMPRESSION: 1. No explanation for hematuria. No nephrolithiasis, ureterolithiasis, enhancing renal cortical lesion, or filling defects within the collecting systems. 2. No bladder stones or filling defects in the bladder which does not excluded a bladder lesion.  Electronically Signed   By: SSuzy BouchardM.D.   On: 11/26/2016 09:22 Note: Reviewed        Physical Exam  General appearance: Well nourished, well developed, and well hydrated. In no apparent acute distress Mental status: Alert, oriented x 3 (person, place, & time)       Respiratory: No evidence of acute respiratory distress Eyes: PERLA Vitals: BP (!) 151/93 (BP Location: Right Arm, Patient Position: Sitting, Cuff Size: Normal)   Pulse 73   Temp (!) 97.3 F (36.3 C) (Temporal)   Resp 16   Ht _0  (1.803 m)   Wt 200 lb (90.7 kg)   SpO2 97%   BMI 27.89 kg/m  BMI: Estimated body mass index is 27.89 kg/m as calculated from the following:   Height as of this encounter: 5'  11" (1.803 m).   Weight as of this encounter: 200 lb (90.7 kg). Ideal: Ideal body weight: 75.3 kg (166 lb 0.1 oz) Adjusted ideal body weight: 81.5 kg (179 lb 9.7 oz)  Assessment   Status Diagnosis  Controlled Controlled Controlled 1. Orchialgia (1ry area of Pain) (Left)   2. 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 Neurogenic pain   4. 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 Chronic pain syndrome   6. Pharmacologic therapy   7. Chronic use of opiate for therapeutic purpose   8. Encounter for medication management      Updated Problems: No problems updated.  Plan of Care  Problem-specific:  No problem-specific Assessment & Plan notes found for this encounter.  Mr. RShaughn ThomleyImrick has a current medication list which includes the following long-term medication(s): amlodipine, atorvastatin, hydrochlorothiazide, tramadol, and gabapentin.  Pharmacotherapy (Medications Ordered): Meds ordered this encounter  Medications   traMADol (ULTRAM)  50 MG tablet    Sig: Take 1 tablet (50 mg total) by mouth every 6 (six) hours as needed for severe pain. Each refill must last 30 days.    Dispense:  120 tablet    Refill:  5    Dispense 1 day early if closed on refill date. Avoid benzodiazepines within 8 hours of opioids. Do not send refill requests.    Orders:  Orders Placed This Encounter  Procedures   ToxASSURE Select 13 (MW), Urine    Volume: 30 ml(s). Minimum 3 ml of urine is needed. Document temperature of fresh sample. Indications: Long term (current) use of opiate analgesic (B83.779)    Order Specific Question:   Release to patient    Answer:   Immediate    Follow-up plan:   Return in about 6 months (around 04/19/2021) for Eval-day (M,W), (F2F), (MM).     Interventional therapies: Planned, scheduled, and/or pending:   Not at this time.   Considering:   None at this time.    Palliative PRN treatment(s):   None at this time.     Recent Visits Date Type Provider  Dept  09/17/20 Telemedicine Milinda Pointer, MD [facility]  09/15/20 Telemedicine Milinda Pointer, MD [facility]  Showing recent visits within past 90 days and meeting all other requirements Today's Visits Date Type Provider Dept  10/21/20 Office Visit Milinda Pointer, MD [facility]  Showing today's visits and meeting all other requirements Future Appointments No visits were found meeting these conditions. Showing future appointments within next 90 days and meeting all other requirements I discussed the assessment and treatment plan with the patient. The patient was provided an opportunity to ask questions and all were answered. The patient agreed with the plan and demonstrated an understanding of the instructions.  Patient advised to call back or seek an in-person evaluation if the symptoms or condition worsens.  Duration of encounter: 30 minutes.  Note by: Gaspar Cola, MD Date: 10/21/2020; Time: 2:38 PM

## 2020-10-21 ENCOUNTER — Ambulatory Visit: Payer: PRIVATE HEALTH INSURANCE | Attending: Pain Medicine | Admitting: Pain Medicine

## 2020-10-21 ENCOUNTER — Other Ambulatory Visit: Payer: Self-pay

## 2020-10-21 ENCOUNTER — Encounter: Payer: Self-pay | Admitting: Pain Medicine

## 2020-10-21 VITALS — BP 151/93 | HR 73 | Temp 97.3°F | Resp 16 | Ht 71.0 in | Wt 200.0 lb

## 2020-10-21 DIAGNOSIS — M792 Neuralgia and neuritis, unspecified: Secondary | ICD-10-CM

## 2020-10-21 DIAGNOSIS — G894 Chronic pain syndrome: Secondary | ICD-10-CM

## 2020-10-21 DIAGNOSIS — Z79891 Long term (current) use of opiate analgesic: Secondary | ICD-10-CM

## 2020-10-21 DIAGNOSIS — N509 Disorder of male genital organs, unspecified: Secondary | ICD-10-CM

## 2020-10-21 DIAGNOSIS — Z79899 Other long term (current) drug therapy: Secondary | ICD-10-CM

## 2020-10-21 DIAGNOSIS — R1032 Left lower quadrant pain: Secondary | ICD-10-CM | POA: Diagnosis not present

## 2020-10-21 DIAGNOSIS — N50812 Left testicular pain: Secondary | ICD-10-CM | POA: Diagnosis not present

## 2020-10-21 DIAGNOSIS — G8929 Other chronic pain: Secondary | ICD-10-CM | POA: Diagnosis present

## 2020-10-21 MED ORDER — TRAMADOL HCL 50 MG PO TABS: 50.0000 mg | ORAL_TABLET | Freq: Four times a day (QID) | ORAL | 5 refills | Status: DC | PRN

## 2020-10-21 NOTE — Progress Notes (Signed)
Nursing Pain Medication Assessment:  Safety precautions to be maintained throughout the outpatient stay will include: orient to surroundings, keep bed in low position, maintain call bell within reach at all times, provide assistance with transfer out of bed and ambulation.  Medication Inspection Compliance: Pill count conducted under aseptic conditions, in front of the patient. Neither the pills nor the bottle was removed from the patient's sight at any time. Once count was completed pills were immediately returned to the patient in their original bottle.  Medication: Tramadol (Ultram) Pill/Patch Count:  97 of 120 pills remain Pill/Patch Appearance: Markings consistent with prescribed medication Bottle Appearance: Standard pharmacy container. Clearly labeled. Filled Date: 8 / 56 / 2022 Last Medication intake:  Today Safety precautions to be maintained throughout the outpatient stay will include: orient to surroundings, keep bed in low position, maintain call bell within reach at all times, provide assistance with transfer out of bed and ambulation.

## 2020-10-21 NOTE — Patient Instructions (Signed)
____________________________________________________________________________________________  Medication Rules  Purpose: To inform patients, and their family members, of our rules and regulations.  Applies to: All patients receiving prescriptions (written or electronic).  Pharmacy of record: Pharmacy where electronic prescriptions will be sent. If written prescriptions are taken to a different pharmacy, please inform the nursing staff. The pharmacy listed in the electronic medical record should be the one where you would like electronic prescriptions to be sent.  Electronic prescriptions: In compliance with the [place] Strengthen Opioid Misuse Prevention (STOP) Act of 2017 (Session Law 2017-74/H243), effective February 01, 2018, all controlled substances must be electronically prescribed. Calling prescriptions to the pharmacy will cease to exist.  Prescription refills: Only during scheduled appointments. Applies to all prescriptions.  NOTE: The following applies primarily to controlled substances (Opioid* Pain Medications).   Type of encounter (visit): For patients receiving controlled substances, face-to-face visits are required. (Not an option or up to the patient.)  Patient's responsibilities: Pain Pills: Bring all pain pills to every appointment (except for procedure appointments). Pill Bottles: Bring pills in original pharmacy bottle. Always bring the newest bottle. Bring bottle, even if empty. Medication refills: You are responsible for knowing and keeping track of what medications you take and those you need refilled. The day before your appointment: write a list of all prescriptions that need to be refilled. The day of the appointment: give the list to the admitting nurse. Prescriptions will be written only during appointments. No prescriptions will be written on procedure days. If you forget a medication: it will not be "Called in", "Faxed", or "electronically sent". You will  need to get another appointment to get these prescribed. No early refills. Do not call asking to have your prescription filled early. Prescription Accuracy: You are responsible for carefully inspecting your prescriptions before leaving our office. Have the discharge nurse carefully go over each prescription with you, before taking them home. Make sure that your name is accurately spelled, that your address is correct. Check the name and dose of your medication to make sure it is accurate. Check the number of pills, and the written instructions to make sure they are clear and accurate. Make sure that you are given enough medication to last until your next medication refill appointment. Taking Medication: Take medication as prescribed. When it comes to controlled substances, taking less pills or less frequently than prescribed is permitted and encouraged. Never take more pills than instructed. Never take medication more frequently than prescribed.  Inform other Doctors: Always inform, all of your healthcare providers, of all the medications you take. Pain Medication from other Providers: You are not allowed to accept any additional pain medication from any other Doctor or Healthcare provider. There are two exceptions to this rule. (see below) In the event that you require additional pain medication, you are responsible for notifying us, as stated below. Cough Medicine: Often these contain an opioid, such as codeine or hydrocodone. Never accept or take cough medicine containing these opioids if you are already taking an opioid* medication. The combination may cause respiratory failure and death. Medication Agreement: You are responsible for carefully reading and following our Medication Agreement. This must be signed before receiving any prescriptions from our practice. Safely store a copy of your signed Agreement. Violations to the Agreement will result in no further prescriptions. (Additional copies of our  Medication Agreement are available upon request.) Laws, Rules, & Regulations: All patients are expected to follow all Federal and State Laws, Statutes, Rules, & Regulations. Ignorance of   the Laws does not constitute a valid excuse.  Illegal drugs and Controlled Substances: The use of illegal substances (including, but not limited to marijuana and its derivatives) and/or the illegal use of any controlled substances is strictly prohibited. Violation of this rule may result in the immediate and permanent discontinuation of any and all prescriptions being written by our practice. The use of any illegal substances is prohibited. Adopted CDC guidelines & recommendations: Target dosing levels will be at or below 60 MME/day. Use of benzodiazepines** is not recommended.  Exceptions: There are only two exceptions to the rule of not receiving pain medications from other Healthcare Providers. Exception #1 (Emergencies): In the event of an emergency (i.e.: accident requiring emergency care), you are allowed to receive additional pain medication. However, you are responsible for: As soon as you are able, call our office [phone], at any time of the day or night, and leave a message stating your name, the date and nature of the emergency, and the name and dose of the medication prescribed. In the event that your call is answered by a member of our staff, make sure to document and save the date, time, and the name of the person that took your information.  Exception #2 (Planned Surgery): In the event that you are scheduled by another doctor or dentist to have any type of surgery or procedure, you are allowed (for a period no longer than 30 days), to receive additional pain medication, for the acute post-op pain. However, in this case, you are responsible for picking up a copy of our "Post-op Pain Management for Surgeons" handout, and giving it to your surgeon or dentist. This document is available at our office, and  does not require an appointment to obtain it. Simply go to our office during business hours (Monday-Thursday from 8:00 AM to 4:00 PM) (Friday 8:00 AM to 12:00 Noon) or if you have a scheduled appointment with us, prior to your surgery, and ask for it by name. In addition, you are responsible for: calling our office [phone], at any time of the day or night, and leaving a message stating your name, name of your surgeon, type of surgery, and date of procedure or surgery. Failure to comply with your responsibilities may result in termination of therapy involving the controlled substances.  *Opioid medications include: morphine, codeine, oxycodone, oxymorphone, hydrocodone, hydromorphone, meperidine, tramadol, tapentadol, buprenorphine, fentanyl, methadone. **Benzodiazepine medications include: diazepam (Valium), alprazolam (Xanax), clonazepam (Klonopine), lorazepam (Ativan), clorazepate (Tranxene), chlordiazepoxide (Librium), estazolam (Prosom), oxazepam (Serax), temazepam (Restoril), triazolam (Halcion) (Last updated: 12/31/2019) ____________________________________________________________________________________________  ____________________________________________________________________________________________  Medication Recommendations and Reminders  Applies to: All patients receiving prescriptions (written and/or electronic).  Medication Rules & Regulations: These rules and regulations exist for your safety and that of others. They are not flexible and neither are we. Dismissing or ignoring them will be considered "non-compliance" with medication therapy, resulting in complete and irreversible termination of such therapy. (See document titled "Medication Rules" for more details.) In all conscience, because of safety reasons, we cannot continue providing a therapy where the patient does not follow instructions.  Pharmacy of record:  Definition: This is the pharmacy where your electronic  prescriptions will be sent.  We do not endorse any particular pharmacy, however, we have experienced problems with [facility] not securing enough medication supply for the community. We do not restrict you in your choice of pharmacy. However, once we write for your prescriptions, we will NOT be re-sending more prescriptions to fix restricted supply problems   created by your pharmacy, or your insurance.  The pharmacy listed in the electronic medical record should be the one where you want electronic prescriptions to be sent. If you choose to change pharmacy, simply notify our nursing staff.  Recommendations: Keep all of your pain medications in a safe place, under lock and key, even if you live alone. We will NOT replace lost, stolen, or damaged medication. After you fill your prescription, take 1 week's worth of pills and put them away in a safe place. You should keep a separate, properly labeled bottle for this purpose. The remainder should be kept in the original bottle. Use this as your primary supply, until it runs out. Once it's gone, then you know that you have 1 week's worth of medicine, and it is time to come in for a prescription refill. If you do this correctly, it is unlikely that you will ever run out of medicine. To make sure that the above recommendation works, it is very important that you make sure your medication refill appointments are scheduled at least 1 week before you run out of medicine. To do this in an effective manner, make sure that you do not leave the office without scheduling your next medication management appointment. Always ask the nursing staff to show you in your prescription , when your medication will be running out. Then arrange for the receptionist to get you a return appointment, at least 7 days before you run out of medicine. Do not wait until you have 1 or 2 pills left, to come in. This is very poor planning and does not take into consideration that we may need to  cancel appointments due to bad weather, sickness, or emergencies affecting our staff. DO NOT ACCEPT A "Partial Fill": If for any reason your pharmacy does not have enough pills/tablets to completely fill or refill your prescription, do not allow for a "partial fill". The law allows the pharmacy to complete that prescription within 72 hours, without requiring a new prescription. If they do not fill the rest of your prescription within those 72 hours, you will need a separate prescription to fill the remaining amount, which we will NOT provide. If the reason for the partial fill is your insurance, you will need to talk to the pharmacist about payment alternatives for the remaining tablets, but again, DO NOT ACCEPT A PARTIAL FILL, unless you can trust your pharmacist to obtain the remainder of the pills within 72 hours.  Prescription refills and/or changes in medication(s):  Prescription refills, and/or changes in dose or medication, will be conducted only during scheduled medication management appointments. (Applies to both, written and electronic prescriptions.) No refills on procedure days. No medication will be changed or started on procedure days. No changes, adjustments, and/or refills will be conducted on a procedure day. Doing so will interfere with the diagnostic portion of the procedure. No phone refills. No medications will be "called into the pharmacy". No Fax refills. No weekend refills. No Holliday refills. No after hours refills.  Remember:  Business hours are:  Monday to Thursday 8:00 AM to 4:00 PM Provider's Schedule: Antwuan Eckley, MD - Appointments are:  Medication management: Monday and Wednesday 8:00 AM to 4:00 PM Procedure day: Tuesday and Thursday 7:30 AM to 4:00 PM Bilal Lateef, MD - Appointments are:  Medication management: Tuesday and Thursday 8:00 AM to 4:00 PM Procedure day: Monday and Wednesday 7:30 AM to 4:00 PM (Last update:  08/22/2019) ____________________________________________________________________________________________  ____________________________________________________________________________________________  CBD (cannabidiol) WARNING    Applicable to: All individuals currently taking or considering taking CBD (cannabidiol) and, more important, all patients taking opioid analgesic controlled substances (pain medication). (Example: oxycodone; oxymorphone; hydrocodone; hydromorphone; morphine; methadone; tramadol; tapentadol; fentanyl; buprenorphine; butorphanol; dextromethorphan; meperidine; codeine; etc.)  Legal status: CBD remains a Schedule I drug prohibited for any use. CBD is illegal with one exception. In the [place], CBD has a limited Food and Drug Administration (FDA) approval for the treatment of two specific types of epilepsy disorders. Only one CBD product has been approved by the FDA for this purpose: "Epidiolex". FDA is aware that some companies are marketing products containing cannabis and cannabis-derived compounds in ways that violate the Federal Food, Drug and Cosmetic Act (FD&C Act) and that may put the health and safety of consumers at risk. The FDA, a Federal agency, has not enforced the CBD status since 2018.   Legality: Some manufacturers ship CBD products nationally, which is illegal. Often such products are sold online and are therefore available throughout the country. CBD is openly sold in head shops and health food stores in some states where such sales have not been explicitly legalized. Selling unapproved products with unsubstantiated therapeutic claims is not only a violation of the law, but also can put patients at risk, as these products have not been proven to be safe or effective. Federal illegality makes it difficult to conduct research on CBD.  Reference: "FDA Regulation of Cannabis and Cannabis-Derived Products, Including Cannabidiol (CBD)" -  https://www.fda.gov/news-events/public-health-focus/fda-regulation-cannabis-and-cannabis-derived-products-including-cannabidiol-cbd  Warning: CBD is not FDA approved and has not undergo the same manufacturing controls as prescription drugs.  This means that the purity and safety of available CBD may be questionable. Most of the time, despite manufacturer's claims, it is contaminated with THC (delta-9-tetrahydrocannabinol - the chemical in marijuana responsible for the "HIGH").  When this is the case, the THC contaminant will trigger a positive urine drug screen (UDS) test for Marijuana (carboxy-THC). Because a positive UDS for any illicit substance is a violation of our medication agreement, your opioid analgesics (pain medicine) may be permanently discontinued.  MORE ABOUT CBD  General Information: CBD  is a derivative of the Marijuana (cannabis sativa) plant discovered in 1940. It is one of the 113 identified substances found in Marijuana. It accounts for up to 40% of the plant's extract. As of 2018, preliminary clinical studies on CBD included research for the treatment of anxiety, movement disorders, and pain. CBD is available and consumed in multiple forms, including inhalation of smoke or vapor, as an aerosol spray, and by mouth. It may be supplied as an oil containing CBD, capsules, dried cannabis, or as a liquid solution. CBD is thought not to be as psychoactive as THC (delta-9-tetrahydrocannabinol - the chemical in marijuana responsible for the "HIGH"). Studies suggest that CBD may interact with different biological target receptors in the body, including cannabinoid and other neurotransmitter receptors. As of 2018 the mechanism of action for its biological effects has not been determined.  Side-effects  Adverse reactions: Dry mouth, diarrhea, decreased appetite, fatigue, drowsiness, malaise, weakness, sleep disturbances, and others.  Drug interactions: CBC may interact with other medications  such as blood-thinners. (Last update: 09/08/2019) ____________________________________________________________________________________________  ____________________________________________________________________________________________  Drug Holidays (Slow)  What is a "Drug Holiday"? Drug Holiday: is the name given to the period of time during which a patient stops taking a medication(s) for the purpose of eliminating tolerance to the drug.  Benefits Improved effectiveness of opioids. Decreased opioid dose needed to achieve benefits. Improved pain with lesser dose.    What is tolerance? Tolerance: is the progressive decreased in effectiveness of a drug due to its repetitive use. With repetitive use, the body gets use to the medication and as a consequence, it loses its effectiveness. This is a common problem seen with opioid pain medications. As a result, a larger dose of the drug is needed to achieve the same effect that used to be obtained with a smaller dose.  How long should a "Drug Holiday" last? You should stay off of the pain medicine for at least 14 consecutive days. (2 weeks)  Should I stop the medicine "cold turkey"? No. You should always coordinate with your Pain Specialist so that he/she can provide you with the correct medication dose to make the transition as smoothly as possible.  How do I stop the medicine? Slowly. You will be instructed to decrease the daily amount of pills that you take by one (1) pill every seven (7) days. This is called a "slow downward taper" of your dose. For example: if you normally take four (4) pills per day, you will be asked to drop this dose to three (3) pills per day for seven (7) days, then to two (2) pills per day for seven (7) days, then to one (1) per day for seven (7) days, and at the end of those last seven (7) days, this is when the "Drug Holiday" would start.   Will I have withdrawals? By doing a "slow downward taper" like this one, it  is unlikely that you will experience any significant withdrawal symptoms. Typically, what triggers withdrawals is the sudden stop of a high dose opioid therapy. Withdrawals can usually be avoided by slowly decreasing the dose over a prolonged period of time. If you do not follow these instructions and decide to stop your medication abruptly, withdrawals may be possible.  What are withdrawals? Withdrawals: refers to the wide range of symptoms that occur after stopping or dramatically reducing opiate drugs after heavy and prolonged use. Withdrawal symptoms do not occur to patients that use low dose opioids, or those who take the medication sporadically. Contrary to benzodiazepine (example: Valium, Xanax, etc.) or alcohol withdrawals ("Delirium Tremens"), opioid withdrawals are not lethal. Withdrawals are the physical manifestation of the body getting rid of the excess receptors.  Expected Symptoms Early symptoms of withdrawal may include: Agitation Anxiety Muscle aches Increased tearing Insomnia Runny nose Sweating Yawning  Late symptoms of withdrawal may include: Abdominal cramping Diarrhea Dilated pupils Goose bumps Nausea Vomiting  Will I experience withdrawals? Due to the slow nature of the taper, it is very unlikely that you will experience any.  What is a slow taper? Taper: refers to the gradual decrease in dose.  (Last update: 08/22/2019) ____________________________________________________________________________________________    

## 2020-10-25 LAB — TOXASSURE SELECT 13 (MW), URINE

## 2020-11-10 ENCOUNTER — Ambulatory Visit: Payer: Self-pay | Admitting: Family Medicine

## 2020-11-26 ENCOUNTER — Ambulatory Visit: Payer: PRIVATE HEALTH INSURANCE | Admitting: Family Medicine

## 2020-11-26 ENCOUNTER — Other Ambulatory Visit: Payer: Self-pay

## 2020-11-26 VITALS — BP 118/68 | HR 59 | Temp 98.6°F | Wt 187.0 lb

## 2020-11-26 DIAGNOSIS — I10 Essential (primary) hypertension: Secondary | ICD-10-CM

## 2020-11-26 DIAGNOSIS — Z23 Encounter for immunization: Secondary | ICD-10-CM

## 2020-11-26 DIAGNOSIS — E782 Mixed hyperlipidemia: Secondary | ICD-10-CM

## 2020-11-26 DIAGNOSIS — R42 Dizziness and giddiness: Secondary | ICD-10-CM

## 2020-11-26 NOTE — Progress Notes (Signed)
Established patient visit   Patient: Dylan [name]   DOB: 1950/06/08   70 y.o. Male  MRN: [id number] Visit Date: 11/26/2020  Today's healthcare provider: [name], MD   No chief complaint on [name].  Subjective    HPI  Patient comes in today for follow-up.  Dylan [name] is doing well and feels well on current medications. He does feel lightheaded or woozy once or twice a day for the past couple of months.  There are no aggravating or alleviating circumstances.  It does not last long.  Maybe a minute or 2.  No associated symptoms such as chest pain shortness of breath diaphoresis or palpitations. Hypertension, follow-up  BP Readings from Last 3 Encounters:  11/26/20 118/68  10/21/20 (!) 151/93  07/02/20 (!) 150/82   Wt Readings from Last 3 Encounters:  11/26/20 187 lb (84.8 kg)  10/21/20 200 lb (90.7 kg)  07/02/20 200 lb (90.7 kg)     He was last seen for hypertension 5 months ago.  BP at that visit was 151/93. Management since that visit includes; Good control of BP with HCTZ and Amlodipine. Mild edema of left lower leg noted (may be side effect of Amlodipine). Recheck labs and monitor sodium intake. He reports good compliance with treatment. He is not having side effects.  Outside blood pressures are not being checked.  He does not smoke.  Use of agents associated with hypertension: none.   --------------------------------------------------------------------------------------------------- Lipid/Cholesterol, follow-up  Last Lipid Panel: Lab Results  Component Value Date   CHOL 171 06/19/2020   LDLCALC 97 06/19/2020   HDL 44 06/19/2020   TRIG 176 (H) 06/19/2020    He was last seen for this 5 months ago.  Management since that visit includes; labs checked showing-All blood tests essentially normal except triglycerides above goal of <150. Continue present medications, low fat diet and regular exercise. Recheck progress in 4 months.  He reports good  compliance with treatment. He is not having side effects.    Last metabolic panel Lab Results  Component Value Date   GLUCOSE 103 (H) 06/19/2020   NA 139 06/19/2020   K 4.4 06/19/2020   BUN 12 06/19/2020   CREATININE 0.89 06/19/2020   EGFR 93 06/19/2020   GFRNONAA 90 12/20/2019   CALCIUM 9.6 06/19/2020   AST 23 06/19/2020   ALT 16 06/19/2020   The 10-year ASCVD risk score (Arnett DK, et al., 2019) is: 16.7%  ---------------------------------------------------------------------------------------------------     Medications: Outpatient Medications Prior to Visit  Medication Sig   amLODipine (NORVASC) 10 MG tablet TAKE ONE TABLET EVERY DAY   atorvastatin (LIPITOR) 40 MG tablet TAKE ONE TABLET BY MOUTH EVERY DAY   hydrochlorothiazide (HYDRODIURIL) 25 MG tablet TAKE ONE-HALF TABLET (12.5 MG TOTAL) BY MOUTH DAILY   latanoprost (XALATAN) 0.005 % ophthalmic solution Place into both eyes daily.    traMADol (ULTRAM) 50 MG tablet Take 1 tablet (50 mg total) by mouth every 6 (six) hours as needed for severe pain. Each refill must last 30 days.   gabapentin (NEURONTIN) 300 MG capsule Take 1 capsule (300 mg total) by mouth 4 (four) times daily.   No facility-administered medications prior to visit.    Review of Systems  Respiratory:  Negative for shortness of breath and wheezing.   Cardiovascular:  Positive for leg swelling (patient states since being on Amlodipine and say it is tolerable). Negative for chest pain and palpitations.  Gastrointestinal:  Negative for nausea and vomiting.  Neurological:  Positive for light-headedness. Negative for dizziness, numbness and headaches.      Objective    BP 118/68 (BP Location: Right Arm, Patient Position: Sitting, Cuff Size: Normal)   Pulse (!) 59   Temp 98.6 F (37 C) (Oral)   Wt 187 lb (84.8 kg)   SpO2 99%   BMI 26.08 kg/m  {Show previous vital signs (optional):23777}  Physical Exam Vitals reviewed.  Constitutional:       General: He is not in acute distress.    Appearance: He is well-developed.  HENT:     Head: Normocephalic and atraumatic.     Right Ear: Hearing normal.     Left Ear: Hearing normal.     Nose: Nose normal.  Eyes:     General: Lids are normal. No scleral icterus.       Right eye: No discharge.        Left eye: No discharge.     Conjunctiva/sclera: Conjunctivae normal.  Cardiovascular:     Rate and Rhythm: Normal rate and regular rhythm.     Heart sounds: Normal heart sounds.  Pulmonary:     Effort: Pulmonary effort is normal. No respiratory distress.     Breath sounds: Normal breath sounds.  Abdominal:     General: Bowel sounds are normal.     Palpations: Abdomen is soft.  Skin:    General: Skin is warm and dry.     Findings: No lesion or rash.  Neurological:     General: No focal deficit present.     Mental Status: He is alert and oriented to person, place, and time.  Psychiatric:        Mood and Affect: Mood normal.        Speech: Speech normal.        Behavior: Behavior normal.        Thought Content: Thought content normal.        Judgment: Judgment normal.    ECG reveals sinus bradycardia with a rate of about 54 with no ischemic changes.  No results found for any visits on 11/26/20.  Assessment & Plan     1. Essential hypertension Excellent control of blood pressure amlodipine 10 and HCTZ 25  2. Mixed hyperlipidemia Tolerating atorvastatin 40  3. Need for influenza vaccination  - Flu Vaccine QUAD High Dose(Fluad)  4. Lightheaded Appears to be benign.  Consider referral to cardiology as this could be sick sinus syndrome or some other arrhythmia.  Patient will see how he feels going forward.  Is also gabapentin as etiology of the symptoms. - EKG 12-Lead   No follow-ups on [name].      I, [name], MD, have reviewed all documentation for this visit. The documentation on 11/30/20 for the exam, diagnosis, procedures, and orders are all accurate and  complete.    Missie Gehrig Cranford Mon, MD  [facility] [phone] (phone) [phone] (fax)  [place]

## 2020-12-01 ENCOUNTER — Telehealth: Payer: Self-pay | Admitting: Family Medicine

## 2020-12-01 MED ORDER — ATORVASTATIN CALCIUM 40 MG PO TABS: 40.0000 mg | ORAL_TABLET | Freq: Every day | ORAL | 1 refills | Status: DC

## 2020-12-01 NOTE — Telephone Encounter (Signed)
[facility] faxed refill request for the following medications:   atorvastatin (LIPITOR) 40 MG tablet  Please advise.

## 2020-12-29 ENCOUNTER — Telehealth: Payer: Self-pay | Admitting: Family Medicine

## 2020-12-29 DIAGNOSIS — I10 Essential (primary) hypertension: Secondary | ICD-10-CM

## 2020-12-29 MED ORDER — AMLODIPINE BESYLATE 10 MG PO TABS: 10.0000 mg | ORAL_TABLET | Freq: Every day | ORAL | 1 refills | Status: DC

## 2020-12-29 NOTE — Telephone Encounter (Signed)
[facility] faxed refill request for the following medications:  amLODipine (NORVASC) 10 MG tablet   Please advise.

## 2021-04-02 ENCOUNTER — Other Ambulatory Visit: Payer: Self-pay

## 2021-04-02 ENCOUNTER — Telehealth: Payer: Self-pay | Admitting: Family Medicine

## 2021-04-02 DIAGNOSIS — I10 Essential (primary) hypertension: Secondary | ICD-10-CM

## 2021-04-02 MED ORDER — HYDROCHLOROTHIAZIDE 25 MG PO TABS: ORAL_TABLET | ORAL | 1 refills | Status: DC

## 2021-04-02 NOTE — Telephone Encounter (Signed)
[facility] faxed refill request for the following medications:   hydrochlorothiazide (HYDRODIURIL) 25 MG tablet    Please advise.  

## 2021-04-07 NOTE — Progress Notes (Signed)
PROVIDER NOTE: Information contained herein reflects review and annotations entered in association with encounter. Interpretation of such information and data should be left to medically-trained personnel. Information provided to patient can be located elsewhere in the medical record under "Patient Instructions". Document created using STT-dictation technology, any transcriptional errors that may result from process are unintentional.    Patient: Dylan [name]  Service Category: E/M  Provider: Gaspar Cola, MD  DOB: 1950-12-09  DOS: 04/08/2021  Specialty: Interventional Pain Management  MRN: [id number]  Setting: Ambulatory outpatient  PCP: Margo Common, PA-C (Inactive)  Type: Established Patient    Referring Provider: Margo Common, PA-C  Location: Office  Delivery: Face-to-face     HPI  Dylan [name], a 71 y.o. year old male, is here today because of his Chronic pain syndrome [G89.4]. Dylan [name]'s primary complain today is Pain (Left, scrotum) Last encounter: My last encounter with him was on 10/21/2020. 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 Pain Assessment: Severity of Chronic pain is reported as a 1 /10. Location: Scrotum Left/denies. Onset: More than a month ago. Quality: Larence Penning. Timing: Intermittent. Modifying factor(s): Tramadol. Vitals:  height is 5' 11"  (1.803 m) and weight is 190 lb (86.2 kg). His temporal temperature is 97.3 F (36.3 C) (abnormal). His blood pressure is 152/89 (abnormal) and his pulse is 73. His respiration is 16 and oxygen saturation is 100%.   Reason for encounter: medication management.   The patient indicates doing well with the  current medication regimen. No adverse reactions or side effects reported to the medications.   RTCB: 10/16/2021 Nonopioids transferred 12/19/2019: Gabapentin  Pharmacotherapy Assessment  Analgesic: Tramadol 50 mg, 1 tab PO q6 hrs (200 mg/day) MME/day: 20 mg/day.   Monitoring: [place] PMP: PDMP reviewed during this encounter.       Pharmacotherapy: No side-effects or adverse reactions reported. Compliance: No problems identified. Effectiveness: Clinically acceptable.  Landis Martins, RN  04/08/2021  8:03 AM  Sign when Signing Visit Nursing Pain Medication Assessment:  Safety precautions to be maintained throughout the outpatient stay will include: orient to surroundings, keep bed in low position, maintain call bell within reach at all times, provide assistance with transfer out of bed and ambulation.  Medication Inspection Compliance: Pill count conducted under aseptic conditions, in front of the patient. Neither the pills nor the bottle was removed from the patient's sight at any time. Once count was completed pills were immediately returned to the patient in their original bottle.  Medication: Tramadol (Ultram) Pill/Patch Count:  110 of 120 pills remain Pill/Patch Appearance: Markings consistent with prescribed medication Bottle Appearance: Standard pharmacy container. Clearly labeled. Filled Date: 03 / 01 / 2023 Last Medication intake:  Yesterday    UDS:  Summary  Date Value Ref Range Status  10/21/2020 Note  Final    Comment:    ==================================================================== [employer] Select 13 (MW) ==================================================================== Test                             Result       Flag       Units  Drug Present and Declared for Prescription Verification   Tramadol                       >  2618        EXPECTED   ng/mg creat   O-Desmethyltramadol            >2618        EXPECTED   ng/mg creat   N-Desmethyltramadol            559           EXPECTED   ng/mg creat    Source of tramadol is a prescription medication. O-desmethyltramadol    and N-desmethyltramadol are expected metabolites of tramadol.  ==================================================================== Test                      Result    Flag   Units      Ref Range   Creatinine              191              mg/dL      >=20 ==================================================================== Declared Medications:  The flagging and interpretation on this report are based on the  following declared medications.  Unexpected results may arise from  inaccuracies in the declared medications.   **Note: The testing scope of this panel includes these medications:   Tramadol (Ultram)   **Note: The testing scope of this panel does not include the  following reported medications:   Amlodipine (Norvasc)  Atorvastatin (Lipitor)  Eye Drop  Gabapentin (Neurontin)  Hydrochlorothiazide ==================================================================== For clinical consultation, please call [phone]. ====================================================================      ROS  Constitutional: Denies any fever or chills Gastrointestinal: No reported hemesis, hematochezia, vomiting, or acute GI distress Musculoskeletal: Denies any acute onset joint swelling, redness, loss of ROM, or weakness Neurological: No reported episodes of acute onset apraxia, aphasia, dysarthria, agnosia, amnesia, paralysis, loss of coordination, or loss of consciousness  Medication Review  amLODipine, atorvastatin, dorzolamide-timolol, gabapentin, hydrochlorothiazide, latanoprost, and traMADol  History Review  Allergy: Dylan [name] has No Known Allergies. Drug: Dylan [name]  has no history on file for drug use. Alcohol:  reports current alcohol use. Tobacco:  reports that he has never smoked. He has never used smokeless tobacco. Social: Dylan [name]  reports that he has never  smoked. He has never used smokeless tobacco. He reports current alcohol use. 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 Surgical: Dylan [name]  has a past surgical history that includes Back surgery (02/2009). Family: family history is not on file.  Laboratory Chemistry Profile   Renal Lab Results  Component Value Date   BUN 12 06/19/2020   CREATININE 0.89 06/19/2020   BCR 13 06/19/2020   GFRAA 104 12/20/2019   GFRNONAA 90 12/20/2019    Hepatic Lab Results  Component Value Date   AST 23 06/19/2020   ALT 16 06/19/2020   ALBUMIN 5.0 (H) 06/19/2020   ALKPHOS 87 06/19/2020   HCVAB NEGATIVE 02/28/2009    Electrolytes Lab Results  Component Value Date   NA 139 06/19/2020   K 4.4 06/19/2020   CL 100 06/19/2020   CALCIUM 9.6 06/19/2020   MG 2.5 (H) 12/08/2018   PHOS 3.3 10/14/2011    Bone Lab Results  Component Value Date   VD25OH 25.44 (L) 12/08/2018   25OHVITD1 34 01/19/2016   25OHVITD2 1.2 01/19/2016   25OHVITD3 33 01/19/2016    Inflammation (CRP: Acute Phase) (ESR: Chronic Phase) Lab Results  Component Value Date   CRP 2.0 (H) 12/08/2018   ESRSEDRATE 9  12/08/2018         Note: Above Lab results reviewed.  Recent Imaging Review  CT ABDOMEN PELVIS W WO CONTRAST CLINICAL DATA:  Gross hematuria  EXAM: CT ABDOMEN AND PELVIS WITHOUT AND WITH CONTRAST  TECHNIQUE: Multidetector CT imaging of the abdomen and pelvis was performed following the standard protocol before and following the bolus administration of intravenous contrast.  CONTRAST:  168m ISOVUE-300 IOPAMIDOL (ISOVUE-300) INJECTION 61%  COMPARISON:  CT 03/23/2008  FINDINGS: Lower chest: Lung bases are clear.  Hepatobiliary: No focal hepatic lesion. No biliary duct dilatation. Gallbladder is normal. Common bile duct is normal.  Pancreas: Pancreas is normal. No ductal dilatation. No  pancreatic inflammation.  Spleen: Normal spleen  Adrenals/urinary tract: Adrenal glands are normal. No nephrolithiasis ureterolithiasis. No enhancing renal cortical lesion. No filling defects within collecting systems or ureters. No bladder calculi, enhancing bladder lesions, or filling defect within the bladder.  Stomach/Bowel: Stomach, small bowel, appendix, and cecum are normal. The colon and rectosigmoid colon are normal.  Vascular/Lymphatic: Abdominal aorta is normal caliber with atherosclerotic calcification. There is no retroperitoneal or periportal lymphadenopathy. No pelvic lymphadenopathy.  Reproductive: Prostate normal  Other: No free fluid.  Musculoskeletal: No aggressive osseous lesion.  IMPRESSION: 1. No explanation for hematuria. No nephrolithiasis, ureterolithiasis, enhancing renal cortical lesion, or filling defects within the collecting systems. 2. No bladder stones or filling defects in the bladder which does not excluded a bladder lesion.  Electronically Signed   By: SSuzy BouchardM.D.   On: 11/26/2016 09:22 Note: Reviewed        Physical Exam  General appearance: Well nourished, well developed, and well hydrated. In no apparent acute distress Mental status: Alert, oriented x 3 (person, place, & time)       Respiratory: No evidence of acute respiratory distress Eyes: PERLA Vitals: BP (!) 152/89    Pulse 73    Temp (!) 97.3 F (36.3 C) (Temporal)    Resp 16    Ht 5' 11"  (1.803 m)    Wt 190 lb (86.2 kg)    SpO2 100%    BMI 26.50 kg/m  BMI: Estimated body mass index is 26.5 kg/m as calculated from the following:   Height as of this encounter: 5' 11"  (1.803 m).   Weight as of this encounter: 190 lb (86.2 kg). Ideal: Ideal body weight: 75.3 kg (166 lb 0.1 oz) Adjusted ideal body weight: 79.7 kg (175 lb 9.7 oz)  Assessment   Status Diagnosis  Controlled Controlled Controlled 1. Chronic pain syndrome   2. Orchialgia (1ry area of Pain) (Left)    3. 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 Chronic groin pain (2ry area of Pain) (Left)   5. Neurogenic pain   6. Pharmacologic therapy   7. Chronic use of opiate for therapeutic purpose   8. Encounter for medication management   9. Encounter for chronic pain management      Updated Problems: No problems updated.  Plan of Care  Problem-specific:  No problem-specific Assessment & Plan notes found for this encounter.  Mr. RRAMZY CAPPELLETTIhas a current medication list which includes the following long-term medication(s): amlodipine, atorvastatin, hydrochlorothiazide, [START ON 04/19/2021] tramadol, and gabapentin.  Pharmacotherapy (Medications Ordered): Meds ordered this encounter  Medications   traMADol (ULTRAM) 50 MG tablet    Sig: Take 1 tablet (50 mg total) by mouth every 6 (six) hours as needed for severe pain. Each refill must last 30 days.    Dispense:  120 tablet  Refill:  5    Dispense 1 day early if closed on refill date. Avoid benzodiazepines within 8 hours of opioids. Do not send refill requests.   Orders:  No orders of the defined types were placed in this encounter.  Follow-up plan:   Return in about 6 months (around 10/16/2021) for Eval-day (M,W), (F2F), (MM).     Interventional Therapies  Risk   Complexity Considerations:   Estimated body mass index is 26.5 kg/m as calculated from the following:   Height as of this encounter: 5' 11"  (1.803 m).   Weight as of this encounter: 190 lb (86.2 kg). WNL   Planned   Pending:      Under consideration:      Completed:   None since initial evaluation   Therapeutic   Palliative (PRN) options:   None    Recent Visits No visits were found meeting these conditions. Showing recent visits within past 90 days and meeting all other requirements Today's Visits Date Type Provider Dept  04/08/21 Office Visit Milinda Pointer, MD [facility]  Showing today's visits and meeting all other requirements Future  Appointments No visits were found meeting these conditions. Showing future appointments within next 90 days and meeting all other requirements  I discussed the assessment and treatment plan with the patient. The patient was provided an opportunity to ask questions and all were answered. The patient agreed with the plan and demonstrated an understanding of the instructions.  Patient advised to call back or seek an in-person evaluation if the symptoms or condition worsens.  Duration of encounter: 30 minutes.  Note by: Gaspar Cola, MD Date: 04/08/2021; Time: 8:45 AM

## 2021-04-08 ENCOUNTER — Encounter: Payer: Self-pay | Admitting: Pain Medicine

## 2021-04-08 ENCOUNTER — Ambulatory Visit: Payer: Medicare Other | Attending: Pain Medicine | Admitting: Pain Medicine

## 2021-04-08 ENCOUNTER — Other Ambulatory Visit: Payer: Self-pay

## 2021-04-08 VITALS — BP 152/89 | HR 73 | Temp 97.3°F | Resp 16 | Ht 71.0 in | Wt 190.0 lb

## 2021-04-08 DIAGNOSIS — G8929 Other chronic pain: Secondary | ICD-10-CM | POA: Diagnosis not present

## 2021-04-08 DIAGNOSIS — N50812 Left testicular pain: Secondary | ICD-10-CM | POA: Insufficient documentation

## 2021-04-08 DIAGNOSIS — Z79899 Other long term (current) drug therapy: Secondary | ICD-10-CM | POA: Insufficient documentation

## 2021-04-08 DIAGNOSIS — G894 Chronic pain syndrome: Secondary | ICD-10-CM | POA: Insufficient documentation

## 2021-04-08 DIAGNOSIS — Z79891 Long term (current) use of opiate analgesic: Secondary | ICD-10-CM | POA: Insufficient documentation

## 2021-04-08 DIAGNOSIS — M792 Neuralgia and neuritis, unspecified: Secondary | ICD-10-CM | POA: Diagnosis not present

## 2021-04-08 DIAGNOSIS — R1032 Left lower quadrant pain: Secondary | ICD-10-CM | POA: Diagnosis not present

## 2021-04-08 DIAGNOSIS — N509 Disorder of male genital organs, unspecified: Secondary | ICD-10-CM | POA: Insufficient documentation

## 2021-04-08 MED ORDER — TRAMADOL HCL 50 MG PO TABS: 50.0000 mg | ORAL_TABLET | Freq: Four times a day (QID) | ORAL | 5 refills | Status: DC | PRN

## 2021-04-08 NOTE — Progress Notes (Signed)
Nursing Pain Medication Assessment:  ?Safety precautions to be maintained throughout the outpatient stay will include: orient to surroundings, keep bed in low position, maintain call bell within reach at all times, provide assistance with transfer out of bed and ambulation.  ?Medication Inspection Compliance: Pill count conducted under aseptic conditions, in front of the patient. Neither the pills nor the bottle was removed from the patient's sight at any time. Once count was completed pills were immediately returned to the patient in their original bottle. ? ?Medication: Tramadol (Ultram) ?Pill/Patch Count:  110 of 120 pills remain ?Pill/Patch Appearance: Markings consistent with prescribed medication ?Bottle Appearance: Standard pharmacy container. Clearly labeled. ?Filled Date: 03 / 01 / 2023 ?Last Medication intake:  Yesterday ?

## 2021-04-08 NOTE — Patient Instructions (Signed)
____________________________________________________________________________________________ ° °Medication Rules ° °Purpose: To inform patients, and their family members, of our rules and regulations. ° °Applies to: All patients receiving prescriptions (written or electronic). ° °Pharmacy of record: Pharmacy where electronic prescriptions will be sent. If written prescriptions are taken to a different pharmacy, please inform the nursing staff. The pharmacy listed in the electronic medical record should be the one where you would like electronic prescriptions to be sent. ° °Electronic prescriptions: In compliance with the [place] Strengthen Opioid Misuse Prevention (STOP) Act of 2017 (Session Law 2017-74/H243), effective February 01, 2018, all controlled substances must be electronically prescribed. Calling prescriptions to the pharmacy will cease to exist. ° °Prescription refills: Only during scheduled appointments. Applies to all prescriptions. ° °NOTE: The following applies primarily to controlled substances (Opioid* Pain Medications).  ° °Type of encounter (visit): For patients receiving controlled substances, face-to-face visits are required. (Not an option or up to the patient.) ° °Patient's responsibilities: °Pain Pills: Bring all pain pills to every appointment (except for procedure appointments). °Pill Bottles: Bring pills in original pharmacy bottle. Always bring the newest bottle. Bring bottle, even if empty. °Medication refills: You are responsible for knowing and keeping track of what medications you take and those you need refilled. °The day before your appointment: write a list of all prescriptions that need to be refilled. °The day of the appointment: give the list to the admitting nurse. Prescriptions will be written only during appointments. No prescriptions will be written on procedure days. °If you forget a medication: it will not be "Called in", "Faxed", or "electronically sent". You will  need to get another appointment to get these prescribed. °No early refills. Do not call asking to have your prescription filled early. °Prescription Accuracy: You are responsible for carefully inspecting your prescriptions before leaving our office. Have the discharge nurse carefully go over each prescription with you, before taking them home. Make sure that your name is accurately spelled, that your address is correct. Check the name and dose of your medication to make sure it is accurate. Check the number of pills, and the written instructions to make sure they are clear and accurate. Make sure that you are given enough medication to last until your next medication refill appointment. °Taking Medication: Take medication as prescribed. When it comes to controlled substances, taking less pills or less frequently than prescribed is permitted and encouraged. °Never take more pills than instructed. °Never take medication more frequently than prescribed.  °Inform other Doctors: Always inform, all of your healthcare providers, of all the medications you take. °Pain Medication from other Providers: You are not allowed to accept any additional pain medication from any other Doctor or Healthcare provider. There are two exceptions to this rule. (see below) In the event that you require additional pain medication, you are responsible for notifying us, as stated below. °Cough Medicine: Often these contain an opioid, such as codeine or hydrocodone. Never accept or take cough medicine containing these opioids if you are already taking an opioid* medication. The combination may cause respiratory failure and death. °Medication Agreement: You are responsible for carefully reading and following our Medication Agreement. This must be signed before receiving any prescriptions from our practice. Safely store a copy of your signed Agreement. Violations to the Agreement will result in no further prescriptions. (Additional copies of our  Medication Agreement are available upon request.) °Laws, Rules, & Regulations: All patients are expected to follow all Federal and State Laws, Statutes, Rules, & Regulations. Ignorance of   the Laws does not constitute a valid excuse.  °Illegal drugs and Controlled Substances: The use of illegal substances (including, but not limited to marijuana and its derivatives) and/or the illegal use of any controlled substances is strictly prohibited. Violation of this rule may result in the immediate and permanent discontinuation of any and all prescriptions being written by our practice. The use of any illegal substances is prohibited. °Adopted CDC guidelines & recommendations: Target dosing levels will be at or below 60 MME/day. Use of benzodiazepines** is not recommended. ° °Exceptions: There are only two exceptions to the rule of not receiving pain medications from other Healthcare Providers. °Exception #1 (Emergencies): In the event of an emergency (i.e.: accident requiring emergency care), you are allowed to receive additional pain medication. However, you are responsible for: As soon as you are able, call our office [phone], at any time of the day or night, and leave a message stating your name, the date and nature of the emergency, and the name and dose of the medication prescribed. In the event that your call is answered by a member of our staff, make sure to document and save the date, time, and the name of the person that took your information.  °Exception #2 (Planned Surgery): In the event that you are scheduled by another doctor or dentist to have any type of surgery or procedure, you are allowed (for a period no longer than 30 days), to receive additional pain medication, for the acute post-op pain. However, in this case, you are responsible for picking up a copy of our "Post-op Pain Management for Surgeons" handout, and giving it to your surgeon or dentist. This document is available at our office, and  does not require an appointment to obtain it. Simply go to our office during business hours (Monday-Thursday from 8:00 AM to 4:00 PM) (Friday 8:00 AM to 12:00 Noon) or if you have a scheduled appointment with us, prior to your surgery, and ask for it by name. In addition, you are responsible for: calling our office [phone], at any time of the day or night, and leaving a message stating your name, name of your surgeon, type of surgery, and date of procedure or surgery. Failure to comply with your responsibilities may result in termination of therapy involving the controlled substances. °Medication Agreement Violation. Following the above rules, including your responsibilities will help you in avoiding a Medication Agreement Violation (“Breaking your Pain Medication Contract”). ° °*Opioid medications include: morphine, codeine, oxycodone, oxymorphone, hydrocodone, hydromorphone, meperidine, tramadol, tapentadol, buprenorphine, fentanyl, methadone. °**Benzodiazepine medications include: diazepam (Valium), alprazolam (Xanax), clonazepam (Klonopine), lorazepam (Ativan), clorazepate (Tranxene), chlordiazepoxide (Librium), estazolam (Prosom), oxazepam (Serax), temazepam (Restoril), triazolam (Halcion) °(Last updated: 10/29/2020) °____________________________________________________________________________________________ ° ____________________________________________________________________________________________ ° °Medication Recommendations and Reminders ° °Applies to: All patients receiving prescriptions (written and/or electronic). ° °Medication Rules & Regulations: These rules and regulations exist for your safety and that of others. They are not flexible and neither are we. Dismissing or ignoring them will be considered "non-compliance" with medication therapy, resulting in complete and irreversible termination of such therapy. (See document titled "Medication Rules" for more details.) In all conscience,  because of safety reasons, we cannot continue providing a therapy where the patient does not follow instructions. ° °Pharmacy of record:  °Definition: This is the pharmacy where your electronic prescriptions will be sent.  °We do not endorse any particular pharmacy, however, we have experienced problems with [facility] not securing enough medication supply for the community. °We do not restrict you   in your choice of pharmacy. However, once we write for your prescriptions, we will NOT be re-sending more prescriptions to fix restricted supply problems created by your pharmacy, or your insurance.  °The pharmacy listed in the electronic medical record should be the one where you want electronic prescriptions to be sent. °If you choose to change pharmacy, simply notify our nursing staff. ° °Recommendations: °Keep all of your pain medications in a safe place, under lock and key, even if you live alone. We will NOT replace lost, stolen, or damaged medication. °After you fill your prescription, take 1 week's worth of pills and put them away in a safe place. You should keep a separate, properly labeled bottle for this purpose. The remainder should be kept in the original bottle. Use this as your primary supply, until it runs out. Once it's gone, then you know that you have 1 week's worth of medicine, and it is time to come in for a prescription refill. If you do this correctly, it is unlikely that you will ever run out of medicine. °To make sure that the above recommendation works, it is very important that you make sure your medication refill appointments are scheduled at least 1 week before you run out of medicine. To do this in an effective manner, make sure that you do not leave the office without scheduling your next medication management appointment. Always ask the nursing staff to show you in your prescription , when your medication will be running out. Then arrange for the receptionist to get you a return appointment,  at least 7 days before you run out of medicine. Do not wait until you have 1 or 2 pills left, to come in. This is very poor planning and does not take into consideration that we may need to cancel appointments due to bad weather, sickness, or emergencies affecting our staff. °DO NOT ACCEPT A "Partial Fill": If for any reason your pharmacy does not have enough pills/tablets to completely fill or refill your prescription, do not allow for a "partial fill". The law allows the pharmacy to complete that prescription within 72 hours, without requiring a new prescription. If they do not fill the rest of your prescription within those 72 hours, you will need a separate prescription to fill the remaining amount, which we will NOT provide. If the reason for the partial fill is your insurance, you will need to talk to the pharmacist about payment alternatives for the remaining tablets, but again, DO NOT ACCEPT A PARTIAL FILL, unless you can trust your pharmacist to obtain the remainder of the pills within 72 hours. ° °Prescription refills and/or changes in medication(s):  °Prescription refills, and/or changes in dose or medication, will be conducted only during scheduled medication management appointments. (Applies to both, written and electronic prescriptions.) °No refills on procedure days. No medication will be changed or started on procedure days. No changes, adjustments, and/or refills will be conducted on a procedure day. Doing so will interfere with the diagnostic portion of the procedure. °No phone refills. No medications will be "called into the pharmacy". °No Fax refills. °No weekend refills. °No Holliday refills. °No after hours refills. ° °Remember:  °Business hours are:  °Monday to Thursday 8:00 AM to 4:00 PM °Provider's Schedule: °Obie Silos, MD - Appointments are:  °Medication management: Monday and Wednesday 8:00 AM to 4:00 PM °Procedure day: Tuesday and Thursday 7:30 AM to 4:00 PM °Bilal Lateef, MD -  Appointments are:  °Medication management: Tuesday and Thursday 8:00   AM to 4:00 PM °Procedure day: Monday and Wednesday 7:30 AM to 4:00 PM °(Last update: 08/22/2019) °____________________________________________________________________________________________ ° ____________________________________________________________________________________________ ° °CBD (cannabidiol) & Delta-8 (Delta-8 tetrahydrocannabinol) WARNING ° °Intro: Cannabidiol (CBD) and tetrahydrocannabinol (THC), are two natural compounds found in plants of the Cannabis genus. They can both be extracted from hemp or cannabis. Hemp and cannabis come from the Cannabis sativa plant. Both compounds interact with your body’s endocannabinoid system, but they have very different effects. CBD does not produce the high sensation associated with cannabis. Delta-8 tetrahydrocannabinol, also known as delta-8 THC, is a psychoactive substance found in the Cannabis sativa plant, of which marijuana and hemp are two varieties. THC is responsible for the high associated with the illicit use of marijuana. ° °Applicable to: All individuals currently taking or considering taking CBD (cannabidiol) and, more important, all patients taking opioid analgesic controlled substances (pain medication). (Example: oxycodone; oxymorphone; hydrocodone; hydromorphone; morphine; methadone; tramadol; tapentadol; fentanyl; buprenorphine; butorphanol; dextromethorphan; meperidine; codeine; etc.) ° °Legal status: CBD remains a Schedule I drug prohibited for any use. CBD is illegal with one exception. In the [place], CBD has a limited Food and Drug Administration (FDA) approval for the treatment of two specific types of epilepsy disorders. Only one CBD product has been approved by the FDA for this purpose: "Epidiolex". FDA is aware that some companies are marketing products containing cannabis and cannabis-derived compounds in ways that violate the Federal Food, Drug and Cosmetic Act  (FD&C Act) and that may put the health and safety of consumers at risk. The FDA, a Federal agency, has not enforced the CBD status since 2018. UPDATE: (03/20/2021) The Drug Enforcement Agency (DEA) issued a letter stating that "delta" cannabinoids, including Delta-8-THCO and Delta-9-THCO, synthetically derived from hemp do not qualify as hemp and will be viewed as Schedule I drugs. (Schedule I drugs, substances, or chemicals are defined as drugs with no currently accepted medical use and a high potential for abuse. Some examples of Schedule I drugs are: heroin, lysergic acid diethylamide (LSD), marijuana (cannabis), 3,4-methylenedioxymethamphetamine (ecstasy), methaqualone, and peyote.) (https://www.dea.gov) ° °Legality: Some manufacturers ship CBD products nationally, which is illegal. Often such products are sold online and are therefore available throughout the country. CBD is openly sold in head shops and health food stores in some states where such sales have not been explicitly legalized. Selling unapproved products with unsubstantiated therapeutic claims is not only a violation of the law, but also can put patients at risk, as these products have not been proven to be safe or effective. Federal illegality makes it difficult to conduct research on CBD. ° °Reference: "FDA Regulation of Cannabis and Cannabis-Derived Products, Including Cannabidiol (CBD)" - https://www.fda.gov/news-events/public-health-focus/fda-regulation-cannabis-and-cannabis-derived-products-including-cannabidiol-cbd ° °Warning: CBD is not FDA approved and has not undergo the same manufacturing controls as prescription drugs.  This means that the purity and safety of available CBD may be questionable. Most of the time, despite manufacturer's claims, it is contaminated with THC (delta-9-tetrahydrocannabinol - the chemical in marijuana responsible for the "HIGH").  When this is the case, the THC contaminant will trigger a positive urine drug  screen (UDS) test for Marijuana (carboxy-THC). Because a positive UDS for any illicit substance is a violation of our medication agreement, your opioid analgesics (pain medicine) may be permanently discontinued. °The FDA recently put out a warning about 5 things that everyone should be aware of regarding Delta-8 THC: °Delta-8 THC products have not been evaluated or approved by the FDA for safe use and may be marketed in ways that put the   public health at risk. °The FDA has received adverse event reports involving delta-8 THC-containing products. °Delta-8 THC has psychoactive and intoxicating effects. °Delta-8 THC manufacturing often involve use of potentially harmful chemicals to create the concentrations of delta-8 THC claimed in the marketplace. The final delta-8 THC product may have potentially harmful by-products (contaminants) due to the chemicals used in the process. Manufacturing of delta-8 THC products may occur in uncontrolled or unsanitary settings, which may lead to the presence of unsafe contaminants or other potentially harmful substances. °Delta-8 THC products should be kept out of the reach of children and pets. ° °MORE ABOUT CBD ° °General Information: CBD was discovered in 1940 and it is a derivative of the cannabis sativa genus plants (Marijuana and Hemp). It is one of the 113 identified substances found in Marijuana. It accounts for up to 40% of the plant's extract. As of 2018, preliminary clinical studies on CBD included research for the treatment of anxiety, movement disorders, and pain. CBD is available and consumed in multiple forms, including inhalation of smoke or vapor, as an aerosol spray, and by mouth. It may be supplied as an oil containing CBD, capsules, dried cannabis, or as a liquid solution. CBD is thought not to be as psychoactive as THC (delta-9-tetrahydrocannabinol - the chemical in marijuana responsible for the "HIGH"). Studies suggest that CBD may interact with different  biological target receptors in the body, including cannabinoid and other neurotransmitter receptors. As of 2018 the mechanism of action for its biological effects has not been determined. ° °Side-effects   Adverse reactions: Dry mouth, diarrhea, decreased appetite, fatigue, drowsiness, malaise, weakness, sleep disturbances, and others. ° °Drug interactions: CBC may interact with other medications such as blood-thinners. Because CBD causes drowsiness on its own, it also increases the drowsiness caused by other medications, including antihistamines (such as Benadryl), benzodiazepines (Xanax, Ativan, Valium), antipsychotics, antidepressants and opioids, as well as alcohol and supplements such as kava, melatonin and St. John's Wort. Be cautious with the following combinations:  ° °Brivaracetam (Briviact) °Brivaracetam is changed and broken down by the body. CBD might decrease how quickly the body breaks down brivaracetam. This might increase levels of brivaracetam in the body. ° °Caffeine °Caffeine is changed and broken down by the body. CBD might decrease how quickly the body breaks down caffeine. This might increase levels of caffeine in the body. ° °Carbamazepine (Tegretol) °Carbamazepine is changed and broken down by the body. CBD might decrease how quickly the body breaks down carbamazepine. This might increase levels of carbamazepine in the body and increase its side effects. ° °Citalopram (Celexa) °Citalopram is changed and broken down by the body. CBD might decrease how quickly the body breaks down citalopram. This might increase levels of citalopram in the body and increase its side effects. ° °Clobazam (Onfi) °Clobazam is changed and broken down by the liver. CBD might decrease how quickly the liver breaks down clobazam. This might increase the effects and side effects of clobazam. ° °Eslicarbazepine (Aptiom) °Eslicarbazepine is changed and broken down by the body. CBD might decrease how quickly the body  breaks down eslicarbazepine. This might increase levels of eslicarbazepine in the body by a small amount. ° °Everolimus (Zostress) °Everolimus is changed and broken down by the body. CBD might decrease how quickly the body breaks down everolimus. This might increase levels of everolimus in the body. ° °Lithium °Taking higher doses of CBD might increase levels of lithium. This can increase the risk of lithium toxicity. ° °Medications changed by the   liver (Cytochrome P450 1A1 (CYP1A1) substrates) °Some medications are changed and broken down by the liver. CBD might change how quickly the liver breaks down these medications. This could change the effects and side effects of these medications. ° °Medications changed by the liver (Cytochrome P450 1A2 (CYP1A2) substrates) °Some medications are changed and broken down by the liver. CBD might change how quickly the liver breaks down these medications. This could change the effects and side effects of these medications. ° °Medications changed by the liver (Cytochrome P450 1B1 (CYP1B1) substrates) °Some medications are changed and broken down by the liver. CBD might change how quickly the liver breaks down these medications. This could change the effects and side effects of these medications. ° °Medications changed by the liver (Cytochrome P450 2A6 (CYP2A6) substrates) °Some medications are changed and broken down by the liver. CBD might change how quickly the liver breaks down these medications. This could change the effects and side effects of these medications. ° °Medications changed by the liver (Cytochrome P450 2B6 (CYP2B6) substrates) °Some medications are changed and broken down by the liver. CBD might change how quickly the liver breaks down these medications. This could change the effects and side effects of these medications. ° °Medications changed by the liver (Cytochrome P450 2C19 (CYP2C19) substrates) °Some medications are changed and broken down by the liver.  CBD might change how quickly the liver breaks down these medications. This could change the effects and side effects of these medications. ° °Medications changed by the liver (Cytochrome P450 2C8 (CYP2C8) substrates) °Some medications are changed and broken down by the liver. CBD might change how quickly the liver breaks down these medications. This could change the effects and side effects of these medications. ° °Medications changed by the liver (Cytochrome P450 2C9 (CYP2C9) substrates) °Some medications are changed and broken down by the liver. CBD might change how quickly the liver breaks down these medications. This could change the effects and side effects of these medications. ° °Medications changed by the liver (Cytochrome P450 2D6 (CYP2D6) substrates) °Some medications are changed and broken down by the liver. CBD might change how quickly the liver breaks down these medications. This could change the effects and side effects of these medications. ° °Medications changed by the liver (Cytochrome P450 2E1 (CYP2E1) substrates) °Some medications are changed and broken down by the liver. CBD might change how quickly the liver breaks down these medications. This could change the effects and side effects of these medications. ° °Medications changed by the liver (Cytochrome P450 3A4 (CYP3A4) substrates) °Some medications are changed and broken down by the liver. CBD might change how quickly the liver breaks down these medications. This could change the effects and side effects of these medications. ° °Medications changed by the liver (Glucuronidated drugs) °Some medications are changed and broken down by the liver. CBD might change how quickly the liver breaks down these medications. This could change the effects and side effects of these medications. ° °Medications that decrease the breakdown of other medications by the liver (Cytochrome P450 2C19 (CYP2C19) inhibitors) °CBD is changed and broken down by the liver.  Some drugs decrease how quickly the liver changes and breaks down CBD. This could change the effects and side effects of CBD. ° °Medications that decrease the breakdown of other medications in the liver (Cytochrome P450 3A4 (CYP3A4) inhibitors) °CBD is changed and broken down by the liver. Some drugs decrease how quickly the liver changes and breaks down CBD. This could change the   effects and side effects of CBD. ° °Medications that increase breakdown of other medications by the liver (Cytochrome P450 3A4 (CYP3A4) inducers) °CBD is changed and broken down by the liver. Some drugs increase how quickly the liver changes and breaks down CBD. This could change the effects and side effects of CBD. ° °Medications that increase the breakdown of other medications by the liver (Cytochrome P450 2C19 (CYP2C19) inducers) °CBD is changed and broken down by the liver. Some drugs increase how quickly the liver changes and breaks down CBD. This could change the effects and side effects of CBD. ° °Methadone (Dolophine) °Methadone is broken down by the liver. CBD might decrease how quickly the liver breaks down methadone. Taking cannabidiol along with methadone might increase the effects and side effects of methadone. ° °Rufinamide (Banzel) °Rufinamide is changed and broken down by the body. CBD might decrease how quickly the body breaks down rufinamide. This might increase levels of rufinamide in the body by a small amount. ° °Sedative medications (CNS depressants) °CBD might cause sleepiness and slowed breathing. Some medications, called sedatives, can also cause sleepiness and slowed breathing. Taking CBD with sedative medications might cause breathing problems and/or too much sleepiness. ° °Sirolimus (Rapamune) °Sirolimus is changed and broken down by the body. CBD might decrease how quickly the body breaks down sirolimus. This might increase levels of sirolimus in the body. ° °Stiripentol (Diacomit) °Stiripentol is changed and  broken down by the body. CBD might decrease how quickly the body breaks down stiripentol. This might increase levels of stiripentol in the body and increase its side effects. ° °Tacrolimus (Prograf) °Tacrolimus is changed and broken down by the body. CBD might decrease how quickly the body breaks down tacrolimus. This might increase levels of tacrolimus in the body. ° °Tamoxifen (Soltamox) °Tamoxifen is changed and broken down by the body. CBD might affect how quickly the body breaks down tamoxifen. This might affect levels of tamoxifen in the body. ° °Topiramate (Topamax) °Topiramate is changed and broken down by the body. CBD might decrease how quickly the body breaks down topiramate. This might increase levels of topiramate in the body by a small amount. ° °Valproate °Valproic acid can cause liver injury. Taking cannabidiol with valproic acid might increase the chance of liver injury. CBD and/or valproic acid might need to be stopped, or the dose might need to be reduced. ° °Warfarin (Coumadin) °CBD might increase levels of warfarin, which can increase the risk for bleeding. CBD and/or warfarin might need to be stopped, or the dose might need to be reduced. ° °Zonisamide °Zonisamide is changed and broken down by the body. CBD might decrease how quickly the body breaks down zonisamide. This might increase levels of zonisamide in the body by a small amount. °(Last update: 04/01/2021) °____________________________________________________________________________________________ ° ____________________________________________________________________________________________ ° °Drug Holidays (Slow) ° °What is a "Drug Holiday"? °Drug Holiday: is the name given to the period of time during which a patient stops taking a medication(s) for the purpose of eliminating tolerance to the drug. ° °Benefits °Improved effectiveness of opioids. °Decreased opioid dose needed to achieve benefits. °Improved pain with lesser  dose. ° °What is tolerance? °Tolerance: is the progressive decreased in effectiveness of a drug due to its repetitive use. With repetitive use, the body gets use to the medication and as a consequence, it loses its effectiveness. This is a common problem seen with opioid pain medications. As a result, a larger dose of the drug is needed to achieve the same effect   that used to be obtained with a smaller dose. ° °How long should a "Drug Holiday" last? °You should stay off of the pain medicine for at least 14 consecutive days. (2 weeks) ° °Should I stop the medicine "cold turkey"? °No. You should always coordinate with your Pain Specialist so that he/she can provide you with the correct medication dose to make the transition as smoothly as possible. ° °How do I stop the medicine? °Slowly. You will be instructed to decrease the daily amount of pills that you take by one (1) pill every seven (7) days. This is called a "slow downward taper" of your dose. For example: if you normally take four (4) pills per day, you will be asked to drop this dose to three (3) pills per day for seven (7) days, then to two (2) pills per day for seven (7) days, then to one (1) per day for seven (7) days, and at the end of those last seven (7) days, this is when the "Drug Holiday" would start.  ° °Will I have withdrawals? °By doing a "slow downward taper" like this one, it is unlikely that you will experience any significant withdrawal symptoms. Typically, what triggers withdrawals is the sudden stop of a high dose opioid therapy. Withdrawals can usually be avoided by slowly decreasing the dose over a prolonged period of time. If you do not follow these instructions and decide to stop your medication abruptly, withdrawals may be possible. ° °What are withdrawals? °Withdrawals: refers to the wide range of symptoms that occur after stopping or dramatically reducing opiate drugs after heavy and prolonged use. Withdrawal symptoms do not occur to  patients that use low dose opioids, or those who take the medication sporadically. Contrary to benzodiazepine (example: Valium, Xanax, etc.) or alcohol withdrawals (“Delirium Tremens”), opioid withdrawals are not lethal. Withdrawals are the physical manifestation of the body getting rid of the excess receptors. ° °Expected Symptoms °Early symptoms of withdrawal may include: °Agitation °Anxiety °Muscle aches °Increased tearing °Insomnia °Runny nose °Sweating °Yawning ° °Late symptoms of withdrawal may include: °Abdominal cramping °Diarrhea °Dilated pupils °Goose bumps °Nausea °Vomiting ° °Will I experience withdrawals? °Due to the slow nature of the taper, it is very unlikely that you will experience any. ° °What is a slow taper? °Taper: refers to the gradual decrease in dose.  °(Last update: 08/22/2019) °____________________________________________________________________________________________ ° °  °

## 2021-04-15 ENCOUNTER — Encounter: Payer: PRIVATE HEALTH INSURANCE | Admitting: Pain Medicine

## 2021-05-18 DIAGNOSIS — H401332 Pigmentary glaucoma, bilateral, moderate stage: Secondary | ICD-10-CM | POA: Diagnosis not present

## 2021-05-29 ENCOUNTER — Other Ambulatory Visit: Payer: Self-pay | Admitting: Family Medicine

## 2021-06-09 ENCOUNTER — Encounter: Payer: Self-pay | Admitting: Family Medicine

## 2021-06-09 ENCOUNTER — Ambulatory Visit (INDEPENDENT_AMBULATORY_CARE_PROVIDER_SITE_OTHER): Payer: Medicare Other | Admitting: Family Medicine

## 2021-06-09 ENCOUNTER — Other Ambulatory Visit: Payer: Self-pay | Admitting: Family Medicine

## 2021-06-09 VITALS — BP 129/74 | HR 65 | Temp 98.2°F | Resp 16 | Ht 71.0 in | Wt 193.0 lb

## 2021-06-09 DIAGNOSIS — I10 Essential (primary) hypertension: Secondary | ICD-10-CM

## 2021-06-09 DIAGNOSIS — N401 Enlarged prostate with lower urinary tract symptoms: Secondary | ICD-10-CM

## 2021-06-09 DIAGNOSIS — E559 Vitamin D deficiency, unspecified: Secondary | ICD-10-CM | POA: Diagnosis not present

## 2021-06-09 DIAGNOSIS — F1120 Opioid dependence, uncomplicated: Secondary | ICD-10-CM

## 2021-06-09 DIAGNOSIS — Z0000 Encounter for general adult medical examination without abnormal findings: Secondary | ICD-10-CM

## 2021-06-09 DIAGNOSIS — E782 Mixed hyperlipidemia: Secondary | ICD-10-CM | POA: Diagnosis not present

## 2021-06-09 NOTE — Progress Notes (Signed)
? ? ?Medicare Initial Preventative Physical Exam ? ? ? ?Patient: Dylan [name], Male    DOB: January 04, 1951, 71 y.o.   MRN: [id number] ?Visit Date: 06/09/2021 ? ?Today's Provider: Megan Mans, MD  ? ?Subjective:  ?  ?Chief Complaint  ?Patient presents with  ? Medicare Wellness  ? ? ?Medicare Initial Preventative Physical Exam ?Dylan [name] is a 71 y.o. male who presents today for his Initial Preventative Physical Exam.  ?He has been married for 36 years and has 3 children with no grandchildren.  He retired 1 month ago.  He has an eye exam next week for lipoma.  And has a dermatology appointment next month.  He had a UroLift by Dr. Evelene Croon in 2021.  This was successful.  Has had a recent URI which is improving. ?HPI ? ?Social History  ? ?Socioeconomic History  ? Marital status: Married  ?  Spouse name: Not on file  ? Number of children: Not on file  ? Years of education: Not on file  ? Highest education level: Not on file  ?Occupational History  ? Not on file  ?Tobacco Use  ? Smoking status: Never  ? Smokeless tobacco: Never  ?Substance and Sexual Activity  ? Alcohol use: Yes  ?  Comment: rarely alcohol  ? Drug use: Not on file  ? Sexual activity: Not on file  ?Other Topics Concern  ? Not on file  ?Social History Narrative  ? Not on file  ? ?Social Determinants of Health  ? ?Financial Resource Strain: Not on file  ?Food Insecurity: Not on file  ?Transportation Needs: Not on file  ?Physical Activity: Not on file  ?Stress: Not on file  ?Social Connections: Not on file  ?Intimate Partner Violence: Not on file  ? ? ?Past Medical History:  ?Diagnosis Date  ? Genital disorder, male 08/16/2005  ? Lumbago 01/17/2009  ? Pain syndrome, chronic   ? Sciatica   ?  ? ?Patient Active Problem List  ? Diagnosis Date Noted  ? Chronic use of opiate for therapeutic purpose 07/02/2020  ? Uncomplicated opioid dependence (HCC) 12/19/2019  ? Elevated C-reactive protein (CRP) 12/19/2018  ? Vitamin D insufficiency 12/19/2018  ? Orchialgia  (1ry area of Pain) (Left) 12/06/2018  ? 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 Pharmacologic therapy 12/06/2018  ? Disorder of skeletal system 12/06/2018  ? Problems influencing health status 12/06/2018  ? Long term current use of opiate analgesic 01/20/2015  ? Long term prescription opiate use 01/20/2015  ? Opiate use 01/20/2015  ? Encounter for long-term current use of medication 01/20/2015  ? Encounter for chronic pain management 01/20/2015  ? Neuropathic pain 01/20/2015  ? Neurogenic pain 01/20/2015  ? Visceral pain 01/20/2015  ? Chronic groin pain (2ry area of Pain) (Left) 01/20/2015  ? Chronic pain syndrome 08/26/2014  ? Neuralgia neuritis, sciatic nerve 08/26/2014  ? Chronic low back pain 01/17/2009  ? Disorder of male genital organs 08/16/2005  ? ? ?Past Surgical History:  ?Procedure Laterality Date  ? BACK SURGERY  02/2009  ? ruptured disk  ? ? ?His family history is not on file. ? ? ?Current Outpatient Medications:  ?  amLODipine (NORVASC) 10 MG tablet, Take 1 tablet (10 mg total) by mouth daily., Disp: 90 tablet, Rfl: 1 ?  atorvastatin (LIPITOR) 40 MG tablet, TAKE ONE TABLET BY MOUTH DAILY, Disp: 90 tablet, Rfl: 1 ?  dorzolamide-timolol (COSOPT) 22.3-6.8 MG/ML ophthalmic solution, 1 drop 2 (two) times daily., Disp: , Rfl:  ?  gabapentin (NEURONTIN) 300 MG capsule, Take 1 capsule (300 mg total) by mouth 4 (four) times daily., Disp: 120 capsule, Rfl: 2 ?  hydrochlorothiazide (HYDRODIURIL) 25 MG tablet, TAKE ONE-HALF TABLET (12.5 MG TOTAL) BY MOUTH DAILY, Disp: 135 tablet, Rfl: 1 ?  latanoprost (XALATAN) 0.005 % ophthalmic solution, Place into both eyes daily. , Disp: , Rfl:  ?  traMADol (ULTRAM) 50 MG tablet, Take 1 tablet (50 mg total) by mouth every 6 (six) hours as needed for severe pain. Each refill must last 30 days., Disp: 120 tablet, Rfl: 5  ? ?Patient Care Team: ?Jerrol Banana., MD as PCP - General (Family Medicine) ? ?Review of Systems ? ?Last lipids ?Lab Results  ?Component Value  Date  ? CHOL 171 06/19/2020  ? HDL 44 06/19/2020  ? [place] 97 06/19/2020  ? TRIG 176 (H) 06/19/2020  ? CHOLHDL 3.9 06/19/2020  ? ?  ?  ?Objective:  ?  ?Vitals: BP 129/74 (BP Location: Right Arm, Patient Position: Sitting, Cuff Size: Normal)   Pulse 65   Temp 98.2 ?F (36.8 ?C) (Temporal)   Resp 16   Ht 5\' 11"  (1.803 m)   Wt 193 lb (87.5 kg)   SpO2 97%   BMI 26.92 kg/m?  ?No results found. ?Physical Exam ?Vitals reviewed.  ?Constitutional:   ?   General: He is not in acute distress. ?   Appearance: Normal appearance. He is well-developed.  ?HENT:  ?   Head: Atraumatic.  ?   Right Ear: Hearing, tympanic membrane and external ear normal.  ?   Left Ear: Hearing, tympanic membrane and external ear normal.  ?   Nose: Nose normal.  ?Eyes:  ?   General: Lids are normal. No scleral icterus.    ?   Right eye: No discharge.     ?   Left eye: No discharge.  ?   Conjunctiva/sclera: Conjunctivae normal.  ?Cardiovascular:  ?   Rate and Rhythm: Normal rate and regular rhythm.  ?   Pulses: Normal pulses.  ?   Heart sounds: Normal heart sounds.  ?Pulmonary:  ?   Effort: Pulmonary effort is normal. No respiratory distress.  ?   Breath sounds: Normal breath sounds.  ?Abdominal:  ?   Palpations: Abdomen is soft.  ?Genitourinary: ?   Penis: Normal.   ?   Testes: Normal.  ?   Prostate: Normal.  ?   Rectum: Normal.  ?Musculoskeletal:  ?   Cervical back: Neck supple.  ?Skin: ?   Findings: No lesion or rash.  ?Neurological:  ?   General: No focal deficit present.  ?   Mental Status: He is alert and oriented to person, place, and time.  ?Psychiatric:     ?   Mood and Affect: Mood normal.     ?   Speech: Speech normal.     ?   Behavior: Behavior normal.     ?   Thought Content: Thought content normal.     ?   Judgment: Judgment normal.  ? ? ? ?Activities of Daily Living ? ?  06/09/2021  ? 10:02 AM 06/19/2020  ?  8:18 AM  ?In your present state of health, do you have any difficulty performing the following activities:  ?Hearing? 0 1   ?Vision? 0 0  ?Difficulty concentrating or making decisions? 0 0  ?Walking or climbing stairs? 0 0  ?Dressing or bathing? 0 0  ?Doing errands, shopping? 0 0  ? ? ?Fall Risk Assessment ? ?  06/09/2021  ? 10:02 AM 04/08/2021  ?  8:00 AM 07/02/2020  ?  8:06 AM 06/19/2020  ?  8:17 AM 04/02/2020  ?  8:13 AM  ?Fall Risk   ?Falls in the past year? 0 0 0 0 0  ?Number falls in past yr: 0  0 0   ?Injury with Fall? 0  0 0   ?Risk for fall due to : No Fall Risks      ?Follow up Falls evaluation completed      ? ?  ?Depression Screen ? ?  06/09/2021  ? 10:02 AM 04/08/2021  ?  8:00 AM 06/19/2020  ?  8:17 AM 04/02/2020  ?  8:13 AM  ?PHQ 2/9 Scores  ?PHQ - 2 Score 0 0 0 0  ?PHQ- 9 Score 1  0   ? ? ?   ? View : No data to display.  ?  ?  ?  ? ? ?  ?Assessment & Plan:  ?  ?Initial Preventative Physical Exam ? ?Reviewed patient's Family Medical History ?Reviewed and updated list of patient's medical providers ?Assessment of cognitive impairment was done ?Assessed patient's functional ability ?Established a written schedule for health screening services ?Health Risk Assessent Completed and Reviewed ? ?Exercise Activities and Dietary recommendations ? Goals   ?None ?  ? ? ?Immunization History  ?Administered Date(s) Administered  ? Fluad Quad(high Dose 65+) 11/10/2018, 12/20/2019, 11/26/2020  ? Influenza, High Dose Seasonal PF 01/06/2018  ? Influenza-Unspecified 01/16/2015, 01/04/2017  ? PFIZER(Purple Top)SARS-COV-2 Vaccination 11/05/2019  ? Pneumococcal Conjugate-13 01/04/2017  ? Pneumococcal Polysaccharide-23 02/06/2019  ? Tdap 08/16/2005  ? ? ?Health Maintenance  ?Topic Date Due  ? Zoster Vaccines- Shingrix (1 of 2) Never done  ? TETANUS/TDAP  08/17/2015  ? COVID-19 Vaccine (2 - Pfizer risk series) 11/26/2019  ? COLONOSCOPY (Pts 45-51yrs Insurance coverage will need to be confirmed)  01/20/2020  ? INFLUENZA VACCINE  09/01/2021  ? Pneumonia Vaccine 48+ Years old  Completed  ? Hepatitis C Screening  Completed  ? HPV VACCINES  Aged Out   ? ? ? ?Discussed health benefits of physical activity, and encouraged him to engage in regular exercise appropriate for his age and condition.  ? ?1. Welcome to Medicare preventive visit ? ? ?2. Medicare annual wellness visit,

## 2021-06-09 NOTE — Progress Notes (Deleted)
? ? ? ?Annual Wellness Visit ? ?I,Dylan [name],acting as a scribe for Megan Mans, MD.,have documented all relevant documentation on the behalf of Megan Mans, MD,as directed by  Megan Mans, MD while in the presence of Megan Mans, MD. ? ?  ? ?Patient: Dylan [name], Male    DOB: [birth year], 71 y.o.   MRN: [id number] ?Visit Date: 06/09/2021 ? ?Today's Provider: Megan Mans, MD  ? ?Chief Complaint  ?Patient presents with  ? Medicare Wellness  ? ?Subjective  ?  ?Dylan [name] is a 71 y.o. male who presents today for his Annual Wellness Visit. ?He reports consuming a general diet. Gym/ health club routine includes goes to the [facility]. He generally feels fairly well. He reports sleeping poorly. He does not have additional problems to discuss today.  ? ?HPI ? ? ? ?Medications: ?Outpatient Medications Prior to Visit  ?Medication Sig  ? amLODipine (NORVASC) 10 MG tablet Take 1 tablet (10 mg total) by mouth daily.  ? atorvastatin (LIPITOR) 40 MG tablet TAKE ONE TABLET BY MOUTH DAILY  ? dorzolamide-timolol (COSOPT) 22.3-6.8 MG/ML ophthalmic solution 1 drop 2 (two) times daily.  ? gabapentin (NEURONTIN) 300 MG capsule Take 1 capsule (300 mg total) by mouth 4 (four) times daily.  ? hydrochlorothiazide (HYDRODIURIL) 25 MG tablet TAKE ONE-HALF TABLET (12.5 MG TOTAL) BY MOUTH DAILY  ? latanoprost (XALATAN) 0.005 % ophthalmic solution Place into both eyes daily.   ? traMADol (ULTRAM) 50 MG tablet Take 1 tablet (50 mg total) by mouth every 6 (six) hours as needed for severe pain. Each refill must last 30 days.  ? ?No facility-administered medications prior to visit.  ?  ?No Known Allergies ? ?Patient Care Team: ?Maple Hudson., MD as PCP - General (Family Medicine) ? ?Review of Systems  ?HENT:  Positive for sneezing, sore throat and tinnitus.   ?Eyes:  Positive for itching.  ?Neurological:  Positive for dizziness.  ?All other systems reviewed and are negative. ? ?{Labs  Heme  Chem   Endocrine  Serology  Results Review (optional):23779} ?  ? Objective  ?  ?Vitals: BP 129/74 (BP Location: Right Arm, Patient Position: Sitting, Cuff Size: Normal)   Pulse 65   Temp 98.2 ?F (36.8 ?C) (Temporal)   Resp 16   Ht 5\' 11"  (1.803 m)   Wt 193 lb (87.5 kg)   SpO2 97%   BMI 26.92 kg/m?  ?{Show previous vital signs (optional):23777} ? ? ?Physical Exam ?*** ? ?Most recent functional status assessment: ? ?  06/09/2021  ? 10:02 AM  ?In your present state of health, do you have any difficulty performing the following activities:  ?Hearing? 0  ?Vision? 0  ?Difficulty concentrating or making decisions? 0  ?Walking or climbing stairs? 0  ?Dressing or bathing? 0  ?Doing errands, shopping? 0  ? ?Most recent fall risk assessment: ? ?  06/09/2021  ? 10:02 AM  ?Fall Risk   ?Falls in the past year? 0  ?Number falls in past yr: 0  ?Injury with Fall? 0  ?Risk for fall due to : No Fall Risks  ?Follow up Falls evaluation completed  ? ? Most recent depression screenings: ? ?  06/09/2021  ? 10:02 AM 04/08/2021  ?  8:00 AM  ?PHQ 2/9 Scores  ?PHQ - 2 Score 0 0  ?PHQ- 9 Score 1   ? ?Most recent cognitive screening: ?   ? View : No data to display.  ?  ?  ?  ? ?  Most recent Audit-C alcohol use screening ? ?  06/09/2021  ? 10:02 AM  ?Alcohol Use Disorder Test (AUDIT)  ?1. How often do you have a drink containing alcohol? 0  ?2. How many drinks containing alcohol do you have on a typical day when you are drinking? 0  ?3. How often do you have six or more drinks on one occasion? 0  ?AUDIT-C Score 0  ? ?A score of 3 or more in women, and 4 or more in men indicates increased risk for alcohol abuse, EXCEPT if all of the points are from question 1  ? ?No results found for any visits on 06/09/21. ? Assessment & Plan  ?  ? ?Annual wellness visit done today including the all of the following: ?Reviewed patient's Family Medical History ?Reviewed and updated list of patient's medical providers ?Assessment of cognitive impairment was done ?Assessed  patient's functional ability ?Established a written schedule for health screening services ?Health Risk Assessent Completed and Reviewed ? ?Exercise Activities and Dietary recommendations ? Goals   ?None ?  ? ? ?Immunization History  ?Administered Date(s) Administered  ? Fluad Quad(high Dose 65+) 11/10/2018, 12/20/2019, 11/26/2020  ? Influenza, High Dose Seasonal PF 01/06/2018  ? Influenza-Unspecified 01/16/2015, 01/04/2017  ? PFIZER(Purple Top)SARS-COV-2 Vaccination 11/05/2019  ? Pneumococcal Conjugate-13 01/04/2017  ? Pneumococcal Polysaccharide-23 02/06/2019  ? Tdap 08/16/2005  ? ? ?Health Maintenance  ?Topic Date Due  ? Zoster Vaccines- Shingrix (1 of 2) Never done  ? TETANUS/TDAP  08/17/2015  ? COVID-19 Vaccine (2 - Pfizer risk series) 11/26/2019  ? COLONOSCOPY (Pts 45-50yrs Insurance coverage will need to be confirmed)  01/20/2020  ? INFLUENZA VACCINE  09/01/2021  ? Pneumonia Vaccine 44+ Years old  Completed  ? Hepatitis C Screening  Completed  ? HPV VACCINES  Aged Out  ? ? ? ?Discussed health benefits of physical activity, and encouraged him to engage in regular exercise appropriate for his age and condition.  ?  ?*** ? ?No follow-ups on file.  ?  ? ?{provider attestation***:1} ? ? ?Dylan Wendelyn Breslow, MD  ?[facility] ?[phone] (phone) ?[phone] (fax) ? ?[facility]  ?

## 2021-07-08 DIAGNOSIS — I10 Essential (primary) hypertension: Secondary | ICD-10-CM | POA: Diagnosis not present

## 2021-07-08 DIAGNOSIS — E782 Mixed hyperlipidemia: Secondary | ICD-10-CM | POA: Diagnosis not present

## 2021-07-08 DIAGNOSIS — Z0000 Encounter for general adult medical examination without abnormal findings: Secondary | ICD-10-CM | POA: Diagnosis not present

## 2021-07-08 DIAGNOSIS — N401 Enlarged prostate with lower urinary tract symptoms: Secondary | ICD-10-CM | POA: Diagnosis not present

## 2021-07-08 DIAGNOSIS — R102 Pelvic and perineal pain: Secondary | ICD-10-CM | POA: Diagnosis not present

## 2021-07-08 DIAGNOSIS — R3914 Feeling of incomplete bladder emptying: Secondary | ICD-10-CM | POA: Diagnosis not present

## 2021-07-08 DIAGNOSIS — Z125 Encounter for screening for malignant neoplasm of prostate: Secondary | ICD-10-CM | POA: Diagnosis not present

## 2021-07-09 LAB — CBC WITH DIFFERENTIAL/PLATELET
Basophils Absolute: 0.1 10*3/uL (ref 0.0–0.2)
Basos: 1 %
EOS (ABSOLUTE): 0.3 10*3/uL (ref 0.0–0.4)
Eos: 5 %
Hematocrit: 39.3 % (ref 37.5–51.0)
Hemoglobin: 13.3 g/dL (ref 13.0–17.7)
Immature Grans (Abs): 0 10*3/uL (ref 0.0–0.1)
Immature Granulocytes: 0 %
Lymphocytes Absolute: 2 10*3/uL (ref 0.7–3.1)
Lymphs: 33 %
MCH: 29 pg (ref 26.6–33.0)
MCHC: 33.8 g/dL (ref 31.5–35.7)
MCV: 86 fL (ref 79–97)
Monocytes Absolute: 0.6 10*3/uL (ref 0.1–0.9)
Monocytes: 10 %
Neutrophils Absolute: 3.1 10*3/uL (ref 1.4–7.0)
Neutrophils: 51 %
Platelets: 273 10*3/uL (ref 150–450)
RBC: 4.59 x10E6/uL (ref 4.14–5.80)
RDW: 12.7 % (ref 11.6–15.4)
WBC: 6 10*3/uL (ref 3.4–10.8)

## 2021-07-09 LAB — COMPREHENSIVE METABOLIC PANEL
ALT: 12 IU/L (ref 0–44)
AST: 18 IU/L (ref 0–40)
Albumin/Globulin Ratio: 1.8 (ref 1.2–2.2)
Albumin: 4.7 g/dL (ref 3.8–4.8)
Alkaline Phosphatase: 87 IU/L (ref 44–121)
BUN/Creatinine Ratio: 16 (ref 10–24)
BUN: 14 mg/dL (ref 8–27)
Bilirubin Total: 0.4 mg/dL (ref 0.0–1.2)
CO2: 26 mmol/L (ref 20–29)
Calcium: 9.5 mg/dL (ref 8.6–10.2)
Chloride: 99 mmol/L (ref 96–106)
Creatinine, Ser: 0.9 mg/dL (ref 0.76–1.27)
Globulin, Total: 2.6 g/dL (ref 1.5–4.5)
Glucose: 92 mg/dL (ref 70–99)
Potassium: 3.9 mmol/L (ref 3.5–5.2)
Sodium: 140 mmol/L (ref 134–144)
Total Protein: 7.3 g/dL (ref 6.0–8.5)
eGFR: 92 mL/min/{1.73_m2} (ref >59)

## 2021-07-09 LAB — LIPID PANEL
Chol/HDL Ratio: 3.8 ratio (ref 0.0–5.0)
Cholesterol, Total: 171 mg/dL (ref 100–199)
HDL: 45 mg/dL (ref >39)
LDL Chol Calc (NIH): 77 mg/dL (ref 0–99)
Triglycerides: 305 mg/dL — ABNORMAL HIGH (ref 0–149)
VLDL Cholesterol Cal: 49 mg/dL — ABNORMAL HIGH (ref 5–40)

## 2021-07-09 LAB — TSH: TSH: 2.75 u[IU]/mL (ref 0.450–4.500)

## 2021-07-13 ENCOUNTER — Encounter: Payer: Self-pay | Admitting: Family Medicine

## 2021-07-13 ENCOUNTER — Ambulatory Visit (INDEPENDENT_AMBULATORY_CARE_PROVIDER_SITE_OTHER): Payer: Medicare Other | Admitting: Family Medicine

## 2021-07-13 VITALS — BP 136/70 | HR 58 | Resp 16 | Wt 194.0 lb

## 2021-07-13 DIAGNOSIS — H01003 Unspecified blepharitis right eye, unspecified eyelid: Secondary | ICD-10-CM

## 2021-07-13 DIAGNOSIS — H01006 Unspecified blepharitis left eye, unspecified eyelid: Secondary | ICD-10-CM | POA: Diagnosis not present

## 2021-07-13 DIAGNOSIS — H1033 Unspecified acute conjunctivitis, bilateral: Secondary | ICD-10-CM

## 2021-07-13 DIAGNOSIS — J309 Allergic rhinitis, unspecified: Secondary | ICD-10-CM | POA: Diagnosis not present

## 2021-07-13 MED ORDER — CETIRIZINE HCL 10 MG PO TABS: 10.0000 mg | ORAL_TABLET | Freq: Every day | ORAL | 11 refills | Status: DC

## 2021-07-13 MED ORDER — CIPROFLOXACIN HCL 0.3 % OP SOLN: 2.0000 [drp] | OPHTHALMIC | 0 refills | Status: DC

## 2021-07-13 MED ORDER — PREDNISONE 20 MG PO TABS: 20.0000 mg | ORAL_TABLET | Freq: Every day | ORAL | 0 refills | Status: DC

## 2021-07-13 NOTE — Patient Instructions (Signed)
MAKE APPOINTMENT WITH EYE DOCTOR.

## 2021-07-13 NOTE — Progress Notes (Signed)
Established patient visit  I,Dylan [name],acting as a scribe for Dylan Mans, MD.,have documented all relevant documentation on the behalf of Dylan Mans, MD,as directed by  Dylan Mans, MD while in the presence of Dylan Mans, MD.   Patient: Dylan [name]   DOB: Mar 24, 1950   71 y.o. Male  MRN: [id number] Visit Date: 07/13/2021  Today's healthcare provider: Megan Mans, MD   Chief Complaint  Patient presents with   Itchy Eye   Subjective    HPI HPI   Bilateral  Last edited by Dylan [name], CMA on 07/13/2021 11:05 AM.      Patient is here concerning bilateral itchy eyes. Patient has had itchy eyes for around 4 weeks. Patient states itchiness has worsened in the last week. Patient has been using prescribed eyedrops with no relief.  This started about 4 weeks ago.  He has swelling irritation tearing and itching.  Visual disturbance and no discharge or matting together of his eyes according to the patient. Medications: Outpatient Medications Prior to Visit  Medication Sig   amLODipine (NORVASC) 10 MG tablet TAKE 1 TABLET BY MOUTH DAILY   atorvastatin (LIPITOR) 40 MG tablet TAKE ONE TABLET BY MOUTH DAILY   dorzolamide-timolol (COSOPT) 22.3-6.8 MG/ML ophthalmic solution 1 drop 2 (two) times daily.   hydrochlorothiazide (HYDRODIURIL) 25 MG tablet TAKE ONE-HALF TABLET (12.5 MG TOTAL) BY MOUTH DAILY   latanoprost (XALATAN) 0.005 % ophthalmic solution Place into both eyes daily.    traMADol (ULTRAM) 50 MG tablet Take 1 tablet (50 mg total) by mouth every 6 (six) hours as needed for severe pain. Each refill must last 30 days.   gabapentin (NEURONTIN) 300 MG capsule Take 1 capsule (300 mg total) by mouth 4 (four) times daily.   No facility-administered medications prior to visit.    Review of Systems  Last CBC Lab Results  Component Value Date   WBC 6.0 07/08/2021   HGB 13.3 07/08/2021   HCT 39.3 07/08/2021   MCV 86 07/08/2021   MCH 29.0  07/08/2021   RDW 12.7 07/08/2021   PLT 273 07/08/2021       Objective    BP 136/70 (BP Location: Right Arm, Patient Position: Sitting, Cuff Size: Normal)   Pulse (!) 58   Resp 16   Wt 194 lb (88 kg)   SpO2 98%   BMI 27.06 kg/m  BP Readings from Last 3 Encounters:  07/13/21 136/70  06/09/21 129/74  04/08/21 (!) 152/89   Wt Readings from Last 3 Encounters:  07/13/21 194 lb (88 kg)  06/09/21 193 lb (87.5 kg)  04/08/21 190 lb (86.2 kg)      Physical Exam Vitals reviewed.  Constitutional:      General: He is not in acute distress.    Appearance: He is well-developed.  HENT:     Head: Normocephalic and atraumatic.     Right Ear: Hearing normal.     Left Ear: Hearing normal.     Nose: Nose normal.  Eyes:     General: Lids are normal. No scleral icterus.       Right eye: No discharge.        Left eye: No discharge.     Conjunctiva/sclera: Conjunctivae normal.     Comments: He has mild conjunctivitis bilaterally and bilateral blepharitis.  There is no erythema of the lids or periorbital region.  Does appear to have some crusting about around some of his eyelashes.  Cardiovascular:  Rate and Rhythm: Normal rate and regular rhythm.  Pulmonary:     Effort: Pulmonary effort is normal. No respiratory distress.  Skin:    Findings: No lesion or rash.  Neurological:     General: No focal deficit present.     Mental Status: He is alert and oriented to person, place, and time.  Psychiatric:        Mood and Affect: Mood normal.        Speech: Speech normal.        Behavior: Behavior normal.        Thought Content: Thought content normal.        Judgment: Judgment normal.       No results found for any visits on 07/13/21.  Assessment & Plan     1. Blepharitis of both eyes, unspecified eyelid, unspecified type I do not think patient has periorbital cellulitis at all at this time.  Due to the extensive nature of blepharitis I would like to refer back to his eye doctor.   If he starts running a fever this gets worse may need to reevaluate and treat as cellulitis. - cetirizine (ZYRTEC) 10 MG tablet; Take 1 tablet (10 mg total) by mouth daily.  Dispense: 30 tablet; Refill: 11 - predniSONE (DELTASONE) 20 MG tablet; Take 1 tablet (20 mg total) by mouth daily with breakfast.  Dispense: 5 tablet; Refill: 0 - ciprofloxacin (CILOXAN) 0.3 % ophthalmic solution; Place 2 drops into both eyes every 2 (two) hours while awake. Administer 1 drop, every 2 hours, while awake, for 2 days. Then 1 drop, every 4 hours, while awake, for the next 5 days.  Dispense: 10 mL; Refill: 0  2. Allergic rhinitis, unspecified seasonality, unspecified trigger  - cetirizine (ZYRTEC) 10 MG tablet; Take 1 tablet (10 mg total) by mouth daily.  Dispense: 30 tablet; Refill: 11 - predniSONE (DELTASONE) 20 MG tablet; Take 1 tablet (20 mg total) by mouth daily with breakfast.  Dispense: 5 tablet; Refill: 0 - ciprofloxacin (CILOXAN) 0.3 % ophthalmic solution; Place 2 drops into both eyes every 2 (two) hours while awake. Administer 1 drop, every 2 hours, while awake, for 2 days. Then 1 drop, every 4 hours, while awake, for the next 5 days.  Dispense: 10 mL; Refill: 0  3. Acute conjunctivitis of both eyes, unspecified acute conjunctivitis type Cipro eyedrops pending appointment with his eye doctor. - cetirizine (ZYRTEC) 10 MG tablet; Take 1 tablet (10 mg total) by mouth daily.  Dispense: 30 tablet; Refill: 11 - predniSONE (DELTASONE) 20 MG tablet; Take 1 tablet (20 mg total) by mouth daily with breakfast.  Dispense: 5 tablet; Refill: 0 - ciprofloxacin (CILOXAN) 0.3 % ophthalmic solution; Place 2 drops into both eyes every 2 (two) hours while awake. Administer 1 drop, every 2 hours, while awake, for 2 days. Then 1 drop, every 4 hours, while awake, for the next 5 days.  Dispense: 10 mL; Refill: 0   No follow-ups on file.      I, Dylan Mans, MD, have reviewed all documentation for this visit. The  documentation on 07/15/21 for the exam, diagnosis, procedures, and orders are all accurate and complete.    Dylan Puopolo Wendelyn Breslow, MD  [facility] [phone] (phone) [phone] (fax)  [facility]

## 2021-07-28 DIAGNOSIS — H401132 Primary open-angle glaucoma, bilateral, moderate stage: Secondary | ICD-10-CM | POA: Diagnosis not present

## 2021-08-07 DIAGNOSIS — H6123 Impacted cerumen, bilateral: Secondary | ICD-10-CM | POA: Diagnosis not present

## 2021-08-18 DIAGNOSIS — Z85828 Personal history of other malignant neoplasm of skin: Secondary | ICD-10-CM | POA: Diagnosis not present

## 2021-08-18 DIAGNOSIS — D225 Melanocytic nevi of trunk: Secondary | ICD-10-CM | POA: Diagnosis not present

## 2021-08-18 DIAGNOSIS — D2261 Melanocytic nevi of right upper limb, including shoulder: Secondary | ICD-10-CM | POA: Diagnosis not present

## 2021-08-18 DIAGNOSIS — D2262 Melanocytic nevi of left upper limb, including shoulder: Secondary | ICD-10-CM | POA: Diagnosis not present

## 2021-08-25 ENCOUNTER — Encounter
Admission: RE | Admit: 2021-08-25 | Discharge: 2021-08-25 | Disposition: A | Discharge status: Home / Self Care | Payer: PRIVATE HEALTH INSURANCE | Source: Ambulatory Visit | Attending: Urology | Admitting: Urology

## 2021-08-25 ENCOUNTER — Other Ambulatory Visit: Payer: Self-pay

## 2021-08-25 DIAGNOSIS — H401132 Primary open-angle glaucoma, bilateral, moderate stage: Secondary | ICD-10-CM | POA: Diagnosis not present

## 2021-08-25 HISTORY — DX: Essential (primary) hypertension: I10

## 2021-08-25 NOTE — Patient Instructions (Signed)
Your procedure is scheduled on: 09/03/21 Report to DAY SURGERY DEPARTMENT LOCATED ON 2ND FLOOR MEDICAL MALL ENTRANCE. To find out your arrival time please call [phone] between 1PM - 3PM on 09/02/21.  Remember: Instructions that are not followed completely may result in serious medical risk, up to and including death, or upon the discretion of your surgeon and anesthesiologist your surgery may need to be rescheduled.     _X__ 1. Do not eat food after midnight the night before your procedure.                 No gum chewing or hard candies. You may drink clear liquids up to 2 hours                 before you are scheduled to arrive for your surgery- DO not drink clear                 liquids within 2 hours of the start of your surgery.                 Clear Liquids include:  water, apple juice without pulp, clear carbohydrate                 drink such as Clearfast or Gatorade, Black Coffee or Tea (Do not add                 anything to coffee or tea). Diabetics water only  __X__2.  On the morning of surgery brush your teeth with toothpaste and water, you                 may rinse your mouth with mouthwash if you wish.  Do not swallow any              toothpaste of mouthwash.     _X__ 3.  No Alcohol for 24 hours before or after surgery.   _X__ 4.  Do Not Smoke or use e-cigarettes For 24 Hours Prior to Your Surgery.                 Do not use any chewable tobacco products for at least 6 hours prior to                 surgery.  ____  5.  Bring all medications with you on the day of surgery if instructed.   __X__  6.  Notify your doctor if there is any change in your medical condition      (cold, fever, infections).     Do not wear jewelry, make-up, hairpins, clips or nail polish. Do not wear lotions, powders, or perfumes.  Do not shave 48 hours prior to surgery. Men may shave face and neck. Do not bring valuables to the hospital.    [facility] is not responsible for any belongings or  valuables.  Contacts, dentures/partials or body piercings may not be worn into surgery. Bring a case for your contacts, glasses or hearing aids, a denture cup will be supplied. Leave your suitcase in the car. After surgery it may be brought to your room. For patients admitted to the hospital, discharge time is determined by your treatment team.   Patients discharged the day of surgery will not be allowed to drive home.     __X__ Take these medicines the morning of surgery with A SIP OF WATER:    1. amLODipine (NORVASC) 10 MG tablet  2. atorvastatin (LIPITOR) 40 MG tablet  3. traMADol (ULTRAM) 50 MG tablet as needed  4.  5.  6.  ____ Fleet Enema (as directed)   __ _ Use CHG Soap/SAGE wipes as directed  ____ Use inhalers on the day of surgery  ____ Stop metformin/Janumet/Farxiga 2 days prior to surgery    ____ Take 1/2 of usual insulin dose the night before surgery. No insulin the morning          of surgery.   ____ Stop Blood Thinners Coumadin/Plavix/Xarelto/Pleta/Pradaxa/Eliquis/Effient/Aspirin  on   Or contact your Surgeon, Cardiologist or Medical Doctor regarding  ability to stop your blood thinners  __X__ Stop Anti-inflammatories 7 days before surgery such as Advil, Ibuprofen, Motrin,  BC or Goodies Powder, Naprosyn, Naproxen, Aleve, Aspirin    __X__ Stop all herbals and supplements, fish oil or vitamins for 1 week until after surgery.    ____ Bring C-Pap to the hospital.

## 2021-08-28 NOTE — H&P (Deleted)
  The note originally documented on this encounter has been moved the the encounter in which it belongs.  

## 2021-08-28 NOTE — H&P (Unsigned)
[name], [name] MEDICAL RECORD NO: [id number] ACCOUNT NO: [id number] DATE OF BIRTH: [birth year] FACILITY: [facility] LOCATION: [facility] PHYSICIAN: Suszanne Conners. Evelene Croon, MD  History and Physical   DATE OF ADMISSION: 09/03/2021   Same day surgery August 3rd.  CHIEF COMPLAINT:  Difficulty voiding.  HISTORY OF PRESENT ILLNESS:  The patient is a 71 year old white male with a greater than 44-month history of progressively worsening lower urinary tract symptoms.  He has a long history of BPH and underwent a UroLift procedure back in 2019.  IPS score was  20 with a quality of life score of 3.  Evaluation in the office included a Uroflow study, which revealed a maximum flow rate of 8 mL per second with an average flow rate of 4.2 mL per second and a postvoid residual of 41 mL based upon a voided volume of  168 mL. Cystoscopy revealed 3 cm of obstructing lateral lobe hypertrophy.  Prostate ultrasound revealed a 41.8 mL prostate.  PSA was normal at 1.6 ng/mL. The patient comes in now for UroLift procedure.  ALLERGIES:  No drug allergies.  CURRENT MEDICATIONS:  Tramadol, atorvastatin, amlodipine, HCTZ.  PAST SURGICAL HISTORY:  1.  Lumbar laminectomy 2011. 2.  UroLift 2019.  PAST AND CURRENT MEDICAL CONDITIONS:  1.  Glaucoma. 2.  Chronic left orchialgia. 3.  Hypercholesterolemia. 4.  Hypertension.  REVIEW OF SYSTEMS:  The patient denies chest pain, shortness of breath, diabetes or stroke or heart disease.  FAMILY HISTORY:  There is no family history of urologic disease.  PHYSICAL EXAMINATION:   VITAL SIGNS:  Height was 5 feet 10 inches, weight 192 pounds, BMI 28. GENERAL:  Well-nourished white male in no acute distress. HEENT:  Sclerae were clear.  Pupils are equally round, reactive to light and accommodation.  Extraocular movements intact. NECK:  No palpable masses or tenderness.  No audible carotid bruits. LYMPHATIC:  No palpable cervical or inguinal adenopathy. PULMONARY:  Lungs clear  to auscultation. CARDIOVASCULAR:  Regular rhythm and rate. ABDOMEN:  Soft, nontender abdomen.  No CVA tenderness. GENITOURINARY:  The patient was circumcised.  Left testis was atrophic approximately 10 mL in size.  Right testis was 18 mL in size. RECTAL:  40 gram smooth, nontender prostate. NEUROMUSCULAR:  Alert and oriented x3.  IMPRESSION:  Benign prostatic hypertrophy with bladder outlet obstruction.  PLAN:  UroLift.     [email or web address] D: 08/28/2021 9:32:10 am T: 08/28/2021 10:07:00 am  JOB: [id number]/ [id number]

## 2021-09-02 MED ORDER — ORAL CARE MOUTH RINSE: 15.0000 mL | Freq: Once | OROMUCOSAL | Status: AC

## 2021-09-02 MED ORDER — FAMOTIDINE 20 MG PO TABS: 20.0000 mg | ORAL_TABLET | Freq: Once | ORAL | Status: AC

## 2021-09-02 MED ORDER — LACTATED RINGERS IV SOLN: INTRAVENOUS | Status: DC

## 2021-09-02 MED ORDER — CHLORHEXIDINE GLUCONATE 0.12 % MT SOLN: 15.0000 mL | Freq: Once | OROMUCOSAL | Status: AC

## 2021-09-02 MED ORDER — CEFAZOLIN SODIUM-DEXTROSE 1-4 GM/50ML-% IV SOLN
1.0000 g | Freq: Once | INTRAVENOUS | Status: AC
  Administered 2021-09-03: 1 g via INTRAVENOUS

## 2021-09-03 ENCOUNTER — Ambulatory Visit: Payer: Medicare Other | Admitting: Certified Registered Nurse Anesthetist

## 2021-09-03 ENCOUNTER — Other Ambulatory Visit: Payer: Self-pay

## 2021-09-03 ENCOUNTER — Ambulatory Visit
Admission: RE | Admit: 2021-09-03 | Discharge: 2021-09-03 | Disposition: A | Discharge status: Home / Self Care | Payer: Medicare Other | Attending: Urology | Admitting: Urology

## 2021-09-03 ENCOUNTER — Encounter: Payer: Self-pay | Admitting: Urology

## 2021-09-03 ENCOUNTER — Encounter
Admission: RE | Disposition: A | Discharge status: Home / Self Care | Payer: Self-pay | Source: Home / Self Care | Attending: Urology

## 2021-09-03 DIAGNOSIS — N401 Enlarged prostate with lower urinary tract symptoms: Secondary | ICD-10-CM | POA: Diagnosis not present

## 2021-09-03 DIAGNOSIS — N400 Enlarged prostate without lower urinary tract symptoms: Secondary | ICD-10-CM | POA: Diagnosis not present

## 2021-09-03 DIAGNOSIS — I10 Essential (primary) hypertension: Secondary | ICD-10-CM | POA: Insufficient documentation

## 2021-09-03 HISTORY — PX: CYSTOSCOPY WITH INSERTION OF UROLIFT: SHX6678

## 2021-09-03 SURGERY — CYSTOSCOPY WITH INSERTION OF UROLIFT
Anesthesia: General | Site: Prostate

## 2021-09-03 MED ORDER — TAMSULOSIN HCL 0.4 MG PO CAPS: 0.4000 mg | ORAL_CAPSULE | Freq: Every day | ORAL | 11 refills | Status: DC

## 2021-09-03 MED ORDER — DEXAMETHASONE SODIUM PHOSPHATE 10 MG/ML IJ SOLN
INTRAMUSCULAR | Status: AC
  Filled 2021-09-03: qty 1

## 2021-09-03 MED ORDER — PROMETHAZINE HCL 25 MG/ML IJ SOLN: 6.2500 mg | INTRAMUSCULAR | Status: DC | PRN

## 2021-09-03 MED ORDER — DROPERIDOL 2.5 MG/ML IJ SOLN: 0.6250 mg | Freq: Once | INTRAMUSCULAR | Status: DC | PRN

## 2021-09-03 MED ORDER — LIDOCAINE HCL (PF) 2 % IJ SOLN
INTRAMUSCULAR | Status: AC
  Filled 2021-09-03: qty 5

## 2021-09-03 MED ORDER — DOCUSATE SODIUM 100 MG PO CAPS
200.0000 mg | ORAL_CAPSULE | Freq: Two times a day (BID) | ORAL | 3 refills | Status: DC
Start: 2021-09-03 — End: 2022-04-19

## 2021-09-03 MED ORDER — MIDAZOLAM HCL 2 MG/2ML IJ SOLN
INTRAMUSCULAR | Status: AC
  Filled 2021-09-03: qty 2

## 2021-09-03 MED ORDER — ONDANSETRON HCL 4 MG/2ML IJ SOLN
INTRAMUSCULAR | Status: DC | PRN
  Administered 2021-09-03: 4 mg via INTRAVENOUS

## 2021-09-03 MED ORDER — IBUPROFEN 600 MG PO TABS
600.0000 mg | ORAL_TABLET | Freq: Once | ORAL | Status: DC | PRN
Start: 2021-09-03 — End: 2021-09-03

## 2021-09-03 MED ORDER — ACETAMINOPHEN 500 MG PO TABS: 1000.0000 mg | ORAL_TABLET | Freq: Once | ORAL | Status: DC | PRN

## 2021-09-03 MED ORDER — STERILE WATER FOR IRRIGATION IR SOLN
Status: DC | PRN
  Administered 2021-09-03: 3000 mL via INTRAVESICAL

## 2021-09-03 MED ORDER — DEXAMETHASONE SODIUM PHOSPHATE 10 MG/ML IJ SOLN
INTRAMUSCULAR | Status: DC | PRN
  Administered 2021-09-03: 10 mg via INTRAVENOUS

## 2021-09-03 MED ORDER — ACETAMINOPHEN 10 MG/ML IV SOLN
INTRAVENOUS | Status: DC | PRN
  Administered 2021-09-03: 1000 mg via INTRAVENOUS

## 2021-09-03 MED ORDER — OXYCODONE HCL 5 MG/5ML PO SOLN: 5.0000 mg | Freq: Once | ORAL | Status: DC | PRN

## 2021-09-03 MED ORDER — LIDOCAINE HCL URETHRAL/MUCOSAL 2 % EX GEL
CUTANEOUS | Status: DC | PRN
  Administered 2021-09-03: 1 via URETHRAL

## 2021-09-03 MED ORDER — DEXMEDETOMIDINE HCL IN NACL 80 MCG/20ML IV SOLN
INTRAVENOUS | Status: AC
  Filled 2021-09-03: qty 20

## 2021-09-03 MED ORDER — PROPOFOL 10 MG/ML IV BOLUS
INTRAVENOUS | Status: DC | PRN
  Administered 2021-09-03: 20 mg via INTRAVENOUS

## 2021-09-03 MED ORDER — FENTANYL CITRATE (PF) 100 MCG/2ML IJ SOLN
INTRAMUSCULAR | Status: AC
  Filled 2021-09-03: qty 2

## 2021-09-03 MED ORDER — ACETAMINOPHEN 10 MG/ML IV SOLN
INTRAVENOUS | Status: AC
  Filled 2021-09-03: qty 100

## 2021-09-03 MED ORDER — PROPOFOL 1000 MG/100ML IV EMUL
INTRAVENOUS | Status: AC
  Filled 2021-09-03: qty 100

## 2021-09-03 MED ORDER — LIDOCAINE HCL (CARDIAC) PF 100 MG/5ML IV SOSY
PREFILLED_SYRINGE | INTRAVENOUS | Status: DC | PRN
  Administered 2021-09-03: 60 mg via INTRAVENOUS

## 2021-09-03 MED ORDER — MIDAZOLAM HCL 2 MG/2ML IJ SOLN
INTRAMUSCULAR | Status: DC | PRN
  Administered 2021-09-03: 2 mg via INTRAVENOUS

## 2021-09-03 MED ORDER — FAMOTIDINE 20 MG PO TABS
ORAL_TABLET | ORAL | Status: AC
  Administered 2021-09-03: 20 mg via ORAL
  Filled 2021-09-03: qty 1

## 2021-09-03 MED ORDER — CHLORHEXIDINE GLUCONATE 0.12 % MT SOLN
OROMUCOSAL | Status: AC
  Administered 2021-09-03: 15 mL via OROMUCOSAL
  Filled 2021-09-03: qty 15

## 2021-09-03 MED ORDER — FENTANYL CITRATE (PF) 100 MCG/2ML IJ SOLN
INTRAMUSCULAR | Status: DC | PRN
  Administered 2021-09-03: 50 ug via INTRAVENOUS

## 2021-09-03 MED ORDER — EPHEDRINE SULFATE (PRESSORS) 50 MG/ML IJ SOLN
INTRAMUSCULAR | Status: DC | PRN
  Administered 2021-09-03 (×4): 5 mg via INTRAVENOUS

## 2021-09-03 MED ORDER — FENTANYL CITRATE (PF) 100 MCG/2ML IJ SOLN: 25.0000 ug | INTRAMUSCULAR | Status: DC | PRN

## 2021-09-03 MED ORDER — EPHEDRINE 5 MG/ML INJ
INTRAVENOUS | Status: AC
  Filled 2021-09-03: qty 5

## 2021-09-03 MED ORDER — OXYCODONE HCL 5 MG PO TABS: 5.0000 mg | ORAL_TABLET | Freq: Once | ORAL | Status: DC | PRN

## 2021-09-03 MED ORDER — CEFAZOLIN SODIUM-DEXTROSE 1-4 GM/50ML-% IV SOLN
INTRAVENOUS | Status: AC
  Filled 2021-09-03: qty 50

## 2021-09-03 MED ORDER — CIPROFLOXACIN HCL 500 MG PO TABS: 500.0000 mg | ORAL_TABLET | Freq: Two times a day (BID) | ORAL | 0 refills | Status: DC

## 2021-09-03 MED ORDER — DEXMEDETOMIDINE (PRECEDEX) IN NS 20 MCG/5ML (4 MCG/ML) IV SYRINGE
PREFILLED_SYRINGE | INTRAVENOUS | Status: DC | PRN
  Administered 2021-09-03 (×2): 4 ug via INTRAVENOUS

## 2021-09-03 MED ORDER — PROPOFOL 500 MG/50ML IV EMUL
INTRAVENOUS | Status: DC | PRN
  Administered 2021-09-03: 150 ug/kg/min via INTRAVENOUS

## 2021-09-03 MED ORDER — URIBEL 118 MG PO CAPS: 1.0000 | ORAL_CAPSULE | Freq: Four times a day (QID) | ORAL | 3 refills | Status: DC | PRN

## 2021-09-03 MED ORDER — ONDANSETRON HCL 4 MG/2ML IJ SOLN
INTRAMUSCULAR | Status: AC
  Filled 2021-09-03: qty 2

## 2021-09-03 SURGICAL SUPPLY — 16 items
BAG DRAIN CYSTO-URO LG1000N (MISCELLANEOUS) ×2 IMPLANT
GAUZE 4X4 16PLY ~~LOC~~+RFID DBL (SPONGE) ×4 IMPLANT
GLOVE BIO SURGEON STRL SZ7.5 (GLOVE) ×2 IMPLANT
GOWN STRL REUS W/ TWL LRG LVL3 (GOWN DISPOSABLE) ×1 IMPLANT
GOWN STRL REUS W/ TWL XL LVL3 (GOWN DISPOSABLE) ×1 IMPLANT
GOWN STRL REUS W/TWL LRG LVL3 (GOWN DISPOSABLE) ×1
GOWN STRL REUS W/TWL XL LVL3 (GOWN DISPOSABLE) ×1
KIT TURNOVER CYSTO (KITS) ×2 IMPLANT
MANIFOLD NEPTUNE II (INSTRUMENTS) ×2 IMPLANT
PACK CYSTO AR (MISCELLANEOUS) ×2 IMPLANT
SET CYSTO W/LG BORE CLAMP LF (SET/KITS/TRAYS/PACK) ×2 IMPLANT
SURGILUBE 2OZ TUBE FLIPTOP (MISCELLANEOUS) ×2 IMPLANT
SYSTEM UROLIFT 2 CART W/ HNDL (Male Continence) ×2 IMPLANT
SYSTEM UROLIFT 2 CARTRIDGE (Male Continence) ×3 IMPLANT
WATER STERILE IRR 3000ML UROMA (IV SOLUTION) ×2 IMPLANT
WATER STERILE IRR 500ML POUR (IV SOLUTION) ×3 IMPLANT

## 2021-09-03 NOTE — H&P (Signed)
Date of Initial H&P: 08/28/21   History reviewed, patient examined, no change in status, stable for surgery. 

## 2021-09-03 NOTE — Discharge Instructions (Addendum)
Prostatic Urethral Lift, Care After The following information offers guidance on how to care for yourself after your procedure. Your health care provider may also give you more specific instructions. If you have problems or questions, contact your health care provider. What can I expect after the procedure? After the procedure, it is common to have: Soreness or discomfort in your penis from having the cystoscope inserted during the procedure. Discomfort or burning when urinating. An increased urge to urinate. More frequent urination. Urine that is blood-tinged. These symptoms should go away after a few days. Follow these instructions at home: Activity A sign showing that a person should not drive.   If you were given a sedative during the procedure, it can affect you for several hours. Do not drive or operate machinery until your health care provider says that it is safe. Avoid sitting for a long time without moving. Get up to take short walks every 1-2 hours. This is important to improve blood flow and breathing. Ask for help if you feel weak or unsteady. You may have to avoid lifting. Ask your health care provider how much you can safely lift. Avoid intense physical activity for as long as told by your health care provider. Return to your normal activities as told by your health care provider. Ask your health care provider what activities are safe for you. Ask when you can return to sexual activity. General instructions A sign showing that a person should not smoke.   Take over-the-counter and prescription medicines only as told by your health care provider. Ask your health care provider if the medicine prescribed to you: Requires you to avoid driving or using machinery. Can cause constipation. You may need to take these actions to prevent or treat constipation: Drink enough fluid to keep your urine pale yellow. Take over-the-counter or prescription medicines. Eat foods that are high  in fiber, such as beans, whole grains, and fresh fruits and vegetables. Limit foods that are high in fat and processed sugars, such as fried or sweet foods. Do not use any products that contain nicotine or tobacco. These products include cigarettes, chewing tobacco, and vaping devices, such as e-cigarettes. These can delay healing after the procedure. If you need help quitting, ask your health care provider. Keep all follow-up visits. This is important. Contact a health care provider if: You have chills or a fever. You have pain when passing urine. You have bright red blood or blood clots in your urine. You have difficulty passing urine. You have leaking of urine (incontinence). Get help right away if: You have chest pain or shortness of breath. You have leg pain or swelling. You cannot pass urine. These symptoms may be an emergency. Get help right away. Call 911. Do not wait to see if the symptoms will go away. Do not drive yourself to the hospital. Summary After the procedure, it is common to have discomfort or burning when urinating, an increased urge to urinate, more frequent urination, and urine that is blood-tinged. You may have to avoid lifting. Ask your health care provider how much you can safely lift. Return to your normal activities as told by your health care provider. Ask when you can return to sexual activity. This information is not intended to replace advice given to you by your health care provider. Make sure you discuss any questions you have with your health care provider. Document Revised: 08/15/2020 Document Reviewed: 08/15/2020 Elsevier Patient Education  2023 [employer].  AMBULATORY SURGERY  DISCHARGE INSTRUCTIONS   The drugs that you were given will stay in your system until tomorrow so for the next 24 hours you should not:  Drive an automobile Make any legal decisions Drink any alcoholic beverage   You may resume regular meals tomorrow.  Today it  is better to start with liquids and gradually work up to solid foods.  You may eat anything you prefer, but it is better to start with liquids, then soup and crackers, and gradually work up to solid foods.   Please notify your doctor immediately if you have any unusual bleeding, trouble breathing, redness and pain at the surgery site, drainage, fever, or pain not relieved by medication.    Your post-operative visit with Dr.                                       is: Date:                        Time:    Please call to schedule your post-operative visit.  Additional Instructions:

## 2021-09-03 NOTE — Anesthesia Procedure Notes (Signed)
Date/Time: 09/03/2021 7:00 AM  Performed by: Ginger Carne, CRNAPre-anesthesia Checklist: Patient identified, Emergency Drugs available, Suction available, Patient being monitored and Timeout performed Patient Re-evaluated:Patient Re-evaluated prior to induction Oxygen Delivery Method: Simple face mask Preoxygenation: Pre-oxygenation with 100% oxygen Induction Type: IV induction

## 2021-09-03 NOTE — Op Note (Signed)
Preoperative diagnosis: BPH with lower urinary tract symptoms (N40.1)  Postoperative diagnosis: Same  Procedure: UroLift procedure ((CPT [id number] and 79390 X3)  Surgeon: Suszanne Conners. Evelene Croon MD  Anesthesia: General  Indications:See the history and physical also. 71 year old (DATE OF BIRTH: 02-24-1950) white male with a greater than 16-month history of progressively worsening lower urinary tract symptoms.  He has a long history of BPH and underwent a UroLift procedure back in 2019.  IPS score was 20 with a quality of life score of 3.  Evaluation in the office included a Uroflow study, which revealed a maximum flow rate of 8 mL per second with an average flow rate of 4.2 mL per second and a postvoid residual of 41 mL based upon a voided volume of 168 mL. Cystoscopy revealed 3 cm of obstructing lateral lobe hypertrophy.  Prostate ultrasound revealed a 41.8 mL prostate.  PSA was normal at 1.6 ng/mL. The patient comes in now for UroLift procedure. After informed consent the above procedure was requested.      Technique and findings: After adequate general anesthesia obtained patient was placed into dorsal lithotomy position and perineum was prepped and draped in the usual fashion.  The urethral meatus was dilated up to 29 [place] with the male dilators.  The cystoscope was then coupled to the camera and advanced into the bladder.  Bladder was thoroughly inspected and no mucosal lesions were identified.  Both ureteral orifices were identified and normally situated on the trigone with clear efflux.  Lateral lobe prostatic hypertrophy was identified with visual obstruction.  The UroLift device was then positioned in the prostatic urethra with visual guidance.  The first 2 implants were placed at the 2:00 and 10:00 positions in the mid prostatic urethra.  The prostatic urethra was then reinspected and good compression of the implant areas was noted.  The next 2 implants were placed at the 2:00 and 10:00 positions just  proximal to the verumontanum.  Inspection of the prostatic urethra revealed a widely patent prostatic urethra and no additional implants were necessary.  Blood loss was minimal.  The scope was then removed and 10 cc of viscous Xylocaine instilled within the urethra.  The procedure was then terminated and patient transferred to the recovery room in stable condition.

## 2021-09-03 NOTE — Transfer of Care (Signed)
Immediate Anesthesia Transfer of Care Note  Patient: Dylan [name]  Procedure(s) Performed: CYSTOSCOPY WITH INSERTION OF UROLIFT (Prostate)  Patient Location: PACU  Anesthesia Type:General  Level of Consciousness: sedated  Airway & Oxygen Therapy: Patient Spontanous Breathing and Patient connected to face mask oxygen  Post-op Assessment: Report given to RN and Post -op Vital signs reviewed and stable  Post vital signs: Reviewed and stable  Last Vitals:  Vitals Value Taken Time  BP 91/54 09/03/21 0806  Temp    Pulse 62 09/03/21 0806  Resp 11 09/03/21 0806  SpO2 99 % 09/03/21 0806    Last Pain:  Vitals:   09/03/21 0634  TempSrc: Oral  PainSc: 0-No pain         Complications: No notable events documented.

## 2021-09-03 NOTE — Anesthesia Preprocedure Evaluation (Addendum)
Anesthesia Evaluation  Patient identified by MRN, date of birth, ID band Patient awake    Reviewed: Allergy & Precautions, NPO status , Patient's Chart, lab work & pertinent test results  Airway Mallampati: II  TM Distance: >3 FB Neck ROM: full    Dental no notable dental hx.    Pulmonary neg pulmonary ROS,    Pulmonary exam normal        Cardiovascular hypertension, Normal cardiovascular exam     Neuro/Psych negative neurological ROS  negative psych ROS   GI/Hepatic negative GI ROS, Neg liver ROS,   Endo/Other  negative endocrine ROS  Renal/GU negative Renal ROS  negative genitourinary   Musculoskeletal   Abdominal Normal abdominal exam  (+)   Peds  Hematology negative hematology ROS (+)   Anesthesia Other Findings Past Medical History: 08/16/2005: Genital disorder, male No date: Hypertension 01/17/2009: Lumbago No date: Pain syndrome, chronic No date: Sciatica  Past Surgical History: 02/01/2009: BACK SURGERY     Comment:  ruptured disk No date: CYSTOSCOPY WITH INSERTION OF UROLIFT  BMI    Body Mass Index: 28.69 kg/m      Reproductive/Obstetrics negative OB ROS                            Anesthesia Physical Anesthesia Plan  ASA: 2  Anesthesia Plan: General   Post-op Pain Management: Minimal or no pain anticipated   Induction: Intravenous  PONV Risk Score and Plan: Propofol infusion and TIVA  Airway Management Planned: Natural Airway  Additional Equipment:   Intra-op Plan:   Post-operative Plan:   Informed Consent: I have reviewed the patients History and Physical, chart, labs and discussed the procedure including the risks, benefits and alternatives for the proposed anesthesia with the patient or authorized representative who has indicated his/her understanding and acceptance.     Dental Advisory Given  Plan Discussed with: Anesthesiologist, CRNA and  Surgeon  Anesthesia Plan Comments:         Anesthesia Quick Evaluation

## 2021-09-04 ENCOUNTER — Encounter: Payer: Self-pay | Admitting: Urology

## 2021-09-04 NOTE — Anesthesia Postprocedure Evaluation (Signed)
Anesthesia Post Note  Patient: Dylan [name]  Procedure(s) Performed: CYSTOSCOPY WITH INSERTION OF UROLIFT (Prostate)  Patient location during evaluation: PACU Anesthesia Type: General Level of consciousness: awake and alert Pain management: pain level controlled Vital Signs Assessment: post-procedure vital signs reviewed and stable Respiratory status: spontaneous breathing, nonlabored ventilation and respiratory function stable Cardiovascular status: blood pressure returned to baseline and stable Postop Assessment: no apparent nausea or vomiting Anesthetic complications: no   No notable events documented.   Last Vitals:  Vitals:   09/03/21 0845 09/03/21 0857  BP: 119/67 126/69  Pulse: 73 65  Resp: (!) 21 18  Temp:  (!) 36.2 C  SpO2: 96% 100%    Last Pain:  Vitals:   09/03/21 0857  TempSrc: Temporal  PainSc: 0-No pain                 Foye Deer

## 2021-09-07 ENCOUNTER — Ambulatory Visit: Payer: Medicare Other | Admitting: Family Medicine

## 2021-09-07 NOTE — Progress Notes (Unsigned)
Established patient visit  I,Dylan [name],acting as a scribe for Dylan Durie, MD.,have documented all relevant documentation on the behalf of Dylan Durie, MD,as directed by  Dylan Durie, MD while in the presence of Dylan Durie, MD.   Patient: Dylan [name]   DOB: 1950-12-29   71 y.o. Male  MRN: [id number] Visit Date: 09/09/2021  Today's healthcare provider: [name], MD   Chief Complaint  Patient presents with   Follow-up   Subjective    HPI  Patient is here for 2 month follow up for chronic problems. Patient needs labs ordered. He was seen for Welcome to Medicare on 06/09/2021.Marland Kitchen Patient had a recent second UroLift by Dr. Yves [name]. Overall he feels well.  He has no complaints today.  He states his wife tells him his chin and mouth twitch on occasion.  He has not really noticed it.  No other issues today at all.  He is taking medicines as prescribed.  Medications: Outpatient Medications Prior to Visit  Medication Sig   amLODipine (NORVASC) 10 MG tablet TAKE 1 TABLET BY MOUTH DAILY   atorvastatin (LIPITOR) 40 MG tablet TAKE ONE TABLET BY MOUTH DAILY   docusate sodium (COLACE) 100 MG capsule Take 2 capsules (200 mg total) by mouth 2 (two) times daily.   hydrochlorothiazide (HYDRODIURIL) 25 MG tablet TAKE ONE-HALF TABLET (12.5 MG TOTAL) BY MOUTH DAILY   latanoprost (XALATAN) 0.005 % ophthalmic solution Place into both eyes daily.    tamsulosin (FLOMAX) 0.4 MG CAPS capsule Take 1 capsule (0.4 mg total) by mouth daily.   traMADol (ULTRAM) 50 MG tablet Take 1 tablet (50 mg total) by mouth every 6 (six) hours as needed for severe pain. Each refill must last 30 days.   [DISCONTINUED] ciprofloxacin (CIPRO) 500 MG tablet Take 1 tablet (500 mg total) by mouth 2 (two) times daily. (Patient not taking: Reported on 09/09/2021)   [DISCONTINUED] Meth-Hyo-M Bl-Na Phos-Ph Sal (URIBEL) 118 MG CAPS Take 1 capsule (118 mg total) by mouth every 6 (six) hours as  needed (dysuria). (Patient not taking: Reported on 09/09/2021)   No facility-administered medications prior to visit.    Review of Systems  Constitutional:  Negative for appetite change, chills and fever.  Respiratory:  Negative for chest tightness, shortness of breath and wheezing.   Cardiovascular:  Negative for chest pain and palpitations.  Gastrointestinal:  Negative for abdominal pain, nausea and vomiting.    Last metabolic panel Lab Results  Component Value Date   GLUCOSE 92 07/08/2021   NA 140 07/08/2021   K 3.9 07/08/2021   CL 99 07/08/2021   CO2 26 07/08/2021   BUN 14 07/08/2021   CREATININE 0.90 07/08/2021   EGFR 92 07/08/2021   CALCIUM 9.5 07/08/2021   PHOS 3.3 10/14/2011   PROT 7.3 07/08/2021   ALBUMIN 4.7 07/08/2021   LABGLOB 2.6 07/08/2021   AGRATIO 1.8 07/08/2021   BILITOT 0.4 07/08/2021   ALKPHOS 87 07/08/2021   AST 18 07/08/2021   ALT 12 07/08/2021   ANIONGAP 8 12/08/2018       Objective    BP 136/68 (BP Location: Left Arm, Patient Position: Sitting, Cuff Size: Large)   Pulse 71   Resp 16   Wt 199 lb (90.3 kg)   SpO2 100%   BMI 28.55 kg/m  BP Readings from Last 3 Encounters:  09/09/21 136/68  09/03/21 126/69  07/13/21 136/70   Wt Readings from Last 3 Encounters:  09/09/21 199  lb (90.3 kg)  09/03/21 199 lb 15.3 oz (90.7 kg)  08/25/21 200 lb (90.7 kg)      Physical Exam Vitals reviewed.  Constitutional:      General: He is not in acute distress.    Appearance: He is well-developed.  HENT:     Head: Normocephalic and atraumatic.     Right Ear: Hearing normal.     Left Ear: Hearing normal.     Nose: Nose normal.  Eyes:     General: Lids are normal. No scleral icterus.       Right eye: No discharge.        Left eye: No discharge.     Conjunctiva/sclera: Conjunctivae normal.  Cardiovascular:     Rate and Rhythm: Normal rate and regular rhythm.     Heart sounds: Normal heart sounds.  Pulmonary:     Effort: Pulmonary effort is normal.  No respiratory distress.  Skin:    Findings: No lesion or rash.  Neurological:     General: No focal deficit present.     Mental Status: He is alert and oriented to person, place, and time.  Psychiatric:        Mood and Affect: Mood normal.        Speech: Speech normal.        Behavior: Behavior normal.        Thought Content: Thought content normal.        Judgment: Judgment normal.       No results found for any visits on 09/09/21.  Assessment & Plan     1. Essential hypertension Good control.  2. Mixed hyperlipidemia On atorvastatin 40  3. Benign prostatic hyperplasia with lower urinary tract symptoms, symptom details unspecified Followed by urology   No follow-ups on file.      I, Dylan Durie, MD, have reviewed all documentation for this visit. The documentation on 09/10/21 for the exam, diagnosis, procedures, and orders are all accurate and complete.    Zelina Jimerson Cranford Mon, MD  [facility] [phone] (phone) [phone] (fax)  Dylan [name]

## 2021-09-08 ENCOUNTER — Other Ambulatory Visit: Payer: Self-pay | Admitting: Family Medicine

## 2021-09-09 ENCOUNTER — Encounter: Payer: Self-pay | Admitting: Family Medicine

## 2021-09-09 ENCOUNTER — Ambulatory Visit (INDEPENDENT_AMBULATORY_CARE_PROVIDER_SITE_OTHER): Payer: Medicare Other | Admitting: Family Medicine

## 2021-09-09 VITALS — BP 136/68 | HR 71 | Resp 16 | Wt 199.0 lb

## 2021-09-09 DIAGNOSIS — E782 Mixed hyperlipidemia: Secondary | ICD-10-CM | POA: Diagnosis not present

## 2021-09-09 DIAGNOSIS — N401 Enlarged prostate with lower urinary tract symptoms: Secondary | ICD-10-CM | POA: Diagnosis not present

## 2021-09-09 DIAGNOSIS — I10 Essential (primary) hypertension: Secondary | ICD-10-CM | POA: Diagnosis not present

## 2021-10-06 NOTE — Progress Notes (Unsigned)
PROVIDER NOTE: Information contained herein reflects review and annotations entered in association with encounter. Interpretation of such information and data should be left to medically-trained personnel. Information provided to patient can be located elsewhere in the medical record under "Patient Instructions". Document created using STT-dictation technology, any transcriptional errors that may result from process are unintentional.    Patient: Dylan [name]  Service Category: E/M  Provider: Gaspar Cola, MD  DOB: 05-Jan-1951  DOS: 10/07/2021  Referring Provider: No ref. provider found  MRN: [id number]  Specialty: Interventional Pain Management  PCP: Jerrol Banana., MD  Type: Established Patient  Setting: Ambulatory outpatient    Location: Office  Delivery: Face-to-face     HPI  Dylan [name], a 71 y.o. year old male, is here today because of his No primary diagnosis found.. Dylan [name]'s primary complain today is Testicle Pain (left) Last encounter: My last encounter with him was on Visit date not found. 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 Pain Assessment: Severity of Chronic pain is reported as a 1 /10. Location: Scrotum Left/Denies. Onset: More than a month ago. Quality: Aching, Burning, Nagging, Dull. Timing: Intermittent. Modifying factor(s): Meds. Vitals:  height is 5' 10"  (1.778 m) and weight is 199 lb (90.3 kg). His temperature is 97.2 F (36.2 C) (abnormal). His blood pressure is 133/74 and his pulse is 76. His oxygen saturation is 100%.   Reason for encounter: medication management.  The patient indicates doing well with the current medication regimen. No  adverse reactions or side effects reported to the medications.   Routine UDS ordered today.   RTCB: 04/14/2022 Nonopioids transferred 12/19/2019: Gabapentin  Pharmacotherapy Assessment  Analgesic: Tramadol 50 mg, 1 tab PO q6 hrs (200 mg/day) MME/day: 20 mg/day.   Monitoring: [place] PMP: PDMP reviewed during this encounter.       Pharmacotherapy: No side-effects or adverse reactions reported. Compliance: No problems identified. Effectiveness: Clinically acceptable.  Dylan Fischer, RN  10/07/2021  8:27 AM  Sign when Signing Visit Nursing Pain Medication Assessment:  Safety precautions to be maintained throughout the outpatient stay will include: orient to surroundings, keep bed in low position, maintain call bell within reach at all times, provide assistance with transfer out of bed and ambulation.  Medication Inspection Compliance: Pill count conducted under aseptic conditions, in front of the patient. Neither the pills nor the bottle was removed from the patient's sight at any time. Once count was completed pills were immediately returned to the patient in their original bottle.  Medication: Tramadol (Ultram) Pill/Patch Count:  110 of 120 pills remain Pill/Patch Appearance: Markings consistent with prescribed medication Bottle Appearance: Standard pharmacy container. Clearly labeled. Filled Date: 8 / 30 / 2023 Last Medication intake:  TodaySafety precautions to be maintained throughout the outpatient stay will include: orient to surroundings, keep bed in low position, maintain call bell within reach at all times, provide assistance with transfer out of bed and ambulation.     No results found for: "CBDTHCR" No results found for: "D8THCCBX" No results found for: "D9THCCBX"  UDS:  Summary  Date Value Ref Range Status  10/21/2020 Note  Final    Comment:    ==================================================================== ToxASSURE Select 13  (MW) ==================================================================== Test  Result       Flag       Units  Drug Present and Declared for Prescription Verification   Tramadol                       >2618        EXPECTED   ng/mg creat   O-Desmethyltramadol            >2618        EXPECTED   ng/mg creat   N-Desmethyltramadol            559          EXPECTED   ng/mg creat    Source of tramadol is a prescription medication. O-desmethyltramadol    and N-desmethyltramadol are expected metabolites of tramadol.  ==================================================================== Test                      Result    Flag   Units      Ref Range   Creatinine              191              mg/dL      >=20 ==================================================================== Declared Medications:  The flagging and interpretation on this report are based on the  following declared medications.  Unexpected results may arise from  inaccuracies in the declared medications.   **Note: The testing scope of this panel includes these medications:   Tramadol (Ultram)   **Note: The testing scope of this panel does not include the  following reported medications:   Amlodipine (Norvasc)  Atorvastatin (Lipitor)  Eye Drop  Gabapentin (Neurontin)  Hydrochlorothiazide ==================================================================== For clinical consultation, please call [phone]. ====================================================================       ROS  Constitutional: Denies any fever or chills Gastrointestinal: No reported hemesis, hematochezia, vomiting, or acute GI distress Musculoskeletal: Denies any acute onset joint swelling, redness, loss of ROM, or weakness Neurological: No reported episodes of acute onset apraxia, aphasia, dysarthria, agnosia, amnesia, paralysis, loss of coordination, or loss of consciousness  Medication Review  amLODipine,  atorvastatin, docusate sodium, hydrochlorothiazide, latanoprost, tamsulosin, and traMADol  History Review  Allergy: Dylan [name] has No Known Allergies. Drug: Dylan [name]  reports no history of drug use. Alcohol:  reports that he does not currently use alcohol. Tobacco:  reports that he has never smoked. He has never used smokeless tobacco. Social: Dylan [name]  reports that he has never smoked. He has never used smokeless tobacco. He reports that he does not currently use alcohol. He reports that he does not use drugs. Medical:  has a past medical history of Genital disorder, male (08/16/2005), Hypertension, Lumbago (01/17/2009), Pain syndrome, chronic, and Sciatica. Surgical: Dylan [name]  has a past surgical history that includes Back surgery (02/01/2009); Cystoscopy with insertion of urolift; and Cystoscopy with insertion of urolift (N/A, 09/03/2021). Family: family history is not on file.  Laboratory Chemistry Profile   Renal Lab Results  Component Value Date   BUN 14 07/08/2021   CREATININE 0.90 07/08/2021   BCR 16 07/08/2021   GFRAA 104 12/20/2019   GFRNONAA 90 12/20/2019    Hepatic Lab Results  Component Value Date   AST 18 07/08/2021   ALT 12 07/08/2021   ALBUMIN 4.7 07/08/2021   ALKPHOS 87 07/08/2021   HCVAB NEGATIVE 02/28/2009    Electrolytes Lab Results  Component Value Date   NA 140 07/08/2021   K 3.9 07/08/2021  CL 99 07/08/2021   CALCIUM 9.5 07/08/2021   MG 2.5 (H) 12/08/2018   PHOS 3.3 10/14/2011    Bone Lab Results  Component Value Date   VD25OH 25.44 (L) 12/08/2018   25OHVITD1 34 01/19/2016   25OHVITD2 1.2 01/19/2016   25OHVITD3 33 01/19/2016    Inflammation (CRP: Acute Phase) (ESR: Chronic Phase) Lab Results  Component Value Date   CRP 2.0 (H) 12/08/2018   ESRSEDRATE 9 12/08/2018         Note: Above Lab results reviewed.  Recent Imaging Review  CT ABDOMEN PELVIS W WO CONTRAST CLINICAL DATA:  Gross hematuria  EXAM: CT ABDOMEN AND PELVIS WITHOUT  AND WITH CONTRAST  TECHNIQUE: Multidetector CT imaging of the abdomen and pelvis was performed following the standard protocol before and following the bolus administration of intravenous contrast.  CONTRAST:  170m ISOVUE-300 IOPAMIDOL (ISOVUE-300) INJECTION 61%  COMPARISON:  CT 03/23/2008  FINDINGS: Lower chest: Lung bases are clear.  Hepatobiliary: No focal hepatic lesion. No biliary duct dilatation. Gallbladder is normal. Common bile duct is normal.  Pancreas: Pancreas is normal. No ductal dilatation. No pancreatic inflammation.  Spleen: Normal spleen  Adrenals/urinary tract: Adrenal glands are normal. No nephrolithiasis ureterolithiasis. No enhancing renal cortical lesion. No filling defects within collecting systems or ureters. No bladder calculi, enhancing bladder lesions, or filling defect within the bladder.  Stomach/Bowel: Stomach, small bowel, appendix, and cecum are normal. The colon and rectosigmoid colon are normal.  Vascular/Lymphatic: Abdominal aorta is normal caliber with atherosclerotic calcification. There is no retroperitoneal or periportal lymphadenopathy. No pelvic lymphadenopathy.  Reproductive: Prostate normal  Other: No free fluid.  Musculoskeletal: No aggressive osseous lesion.  IMPRESSION: 1. No explanation for hematuria. No nephrolithiasis, ureterolithiasis, enhancing renal cortical lesion, or filling defects within the collecting systems. 2. No bladder stones or filling defects in the bladder which does not excluded a bladder lesion.  Electronically Signed   By: SSuzy BouchardM.D.   On: 11/26/2016 09:22 Note: Reviewed        Physical Exam  General appearance: Well nourished, well developed, and well hydrated. In no apparent acute distress Mental status: Alert, oriented x 3 (person, place, & time)       Respiratory: No evidence of acute respiratory distress Eyes: PERLA Vitals: BP 133/74   Pulse 76   Temp (!) 97.2 F (36.2 C)    Ht 5' 10"  (1.778 m)   Wt 199 lb (90.3 kg)   SpO2 100%   BMI 28.55 kg/m  BMI: Estimated body mass index is 28.55 kg/m as calculated from the following:   Height as of this encounter: 5' 10"  (1.778 m).   Weight as of this encounter: 199 lb (90.3 kg). Ideal: Ideal body weight: 73 kg (160 lb 15 oz) Adjusted ideal body weight: 79.9 kg (176 lb 2.6 oz)  Assessment   Diagnosis Status  1. Chronic pain syndrome   2. Orchialgia (1ry area of Pain) (Left)   3. Neurogenic pain   4. 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 Chronic groin pain (2ry area of Pain) (Left)   6. Pharmacologic therapy   7. Chronic use of opiate for therapeutic purpose   8. Encounter for medication management   9. Encounter for chronic pain management    Controlled Controlled Controlled   Updated Problems: No problems updated.  Plan of Care  Problem-specific:  No problem-specific Assessment & Plan notes found for this encounter.  Mr. RKENDRA WOOLFORDhas a current medication list which includes the  following long-term medication(s): amlodipine, atorvastatin, hydrochlorothiazide, and [START ON 10/16/2021] tramadol.  Pharmacotherapy (Medications Ordered): Meds ordered this encounter  Medications   traMADol (ULTRAM) 50 MG tablet    Sig: Take 1 tablet (50 mg total) by mouth every 6 (six) hours as needed for severe pain. Each refill must last 30 days.    Dispense:  120 tablet    Refill:  5    Dispense 1 day early if closed on refill date. Avoid benzodiazepines within 8 hours of opioids. Do not send refill requests.   Orders:  Orders Placed This Encounter  Procedures   ToxASSURE Select 13 (MW), Urine    Volume: 30 ml(s). Minimum 3 ml of urine is needed. Document temperature of fresh sample. Indications: Long term (current) use of opiate analgesic (F57.322)    Order Specific Question:   Release to patient    Answer:   Immediate   Follow-up plan:   Return in about 27 weeks (around 04/14/2022) for Eval-day (M,W),  (F2F), (MM).     Interventional Therapies  Risk  Complexity Considerations:   Estimated body mass index is 26.5 kg/m as calculated from the following:   Height as of this encounter: 5' 11"  (1.803 m).   Weight as of this encounter: 190 lb (86.2 kg). WNL   Planned  Pending:      Under consideration:      Completed:   None since initial evaluation   Therapeutic  Palliative (PRN) options:   None     Recent Visits No visits were found meeting these conditions. Showing recent visits within past 90 days and meeting all other requirements Today's Visits Date Type Provider Dept  10/07/21 Office Visit Milinda Pointer, MD [facility]  Showing today's visits and meeting all other requirements Future Appointments No visits were found meeting these conditions. Showing future appointments within next 90 days and meeting all other requirements  I discussed the assessment and treatment plan with the patient. The patient was provided an opportunity to ask questions and all were answered. The patient agreed with the plan and demonstrated an understanding of the instructions.  Patient advised to call back or seek an in-person evaluation if the symptoms or condition worsens.  Duration of encounter: 30 minutes.  Total time on encounter, as per AMA guidelines included both the face-to-face and non-face-to-face time personally spent by the physician and/or other qualified health care professional(s) on the day of the encounter (includes time in activities that require the physician or other qualified health care professional and does not include time in activities normally performed by clinical staff). Physician's time may include the following activities when performed: preparing to see the patient (eg, review of tests, pre-charting review of records) obtaining and/or reviewing separately obtained history performing a medically appropriate examination and/or evaluation counseling  and educating the patient/family/caregiver ordering medications, tests, or procedures referring and communicating with other health care professionals (when not separately reported) documenting clinical information in the electronic or other health record independently interpreting results (not separately reported) and communicating results to the patient/ family/caregiver care coordination (not separately reported)  Note by: Gaspar Cola, MD Date: 10/07/2021; Time: 8:59 AM

## 2021-10-06 NOTE — Patient Instructions (Signed)

## 2021-10-07 ENCOUNTER — Encounter: Payer: Self-pay | Admitting: Pain Medicine

## 2021-10-07 ENCOUNTER — Ambulatory Visit: Payer: Medicare Other | Attending: Pain Medicine | Admitting: Pain Medicine

## 2021-10-07 DIAGNOSIS — N509 Disorder of male genital organs, unspecified: Secondary | ICD-10-CM | POA: Diagnosis not present

## 2021-10-07 DIAGNOSIS — R1032 Left lower quadrant pain: Secondary | ICD-10-CM

## 2021-10-07 DIAGNOSIS — Z79899 Other long term (current) drug therapy: Secondary | ICD-10-CM | POA: Diagnosis not present

## 2021-10-07 DIAGNOSIS — G8929 Other chronic pain: Secondary | ICD-10-CM

## 2021-10-07 DIAGNOSIS — N50812 Left testicular pain: Secondary | ICD-10-CM

## 2021-10-07 DIAGNOSIS — M792 Neuralgia and neuritis, unspecified: Secondary | ICD-10-CM | POA: Diagnosis not present

## 2021-10-07 DIAGNOSIS — G894 Chronic pain syndrome: Secondary | ICD-10-CM

## 2021-10-07 DIAGNOSIS — Z79891 Long term (current) use of opiate analgesic: Secondary | ICD-10-CM | POA: Diagnosis not present

## 2021-10-07 MED ORDER — TRAMADOL HCL 50 MG PO TABS: 50.0000 mg | ORAL_TABLET | Freq: Four times a day (QID) | ORAL | 5 refills | Status: DC | PRN

## 2021-10-07 NOTE — Progress Notes (Signed)
Nursing Pain Medication Assessment:  Safety precautions to be maintained throughout the outpatient stay will include: orient to surroundings, keep bed in low position, maintain call bell within reach at all times, provide assistance with transfer out of bed and ambulation.  Medication Inspection Compliance: Pill count conducted under aseptic conditions, in front of the patient. Neither the pills nor the bottle was removed from the patient's sight at any time. Once count was completed pills were immediately returned to the patient in their original bottle.  Medication: Tramadol (Ultram) Pill/Patch Count:  110 of 120 pills remain Pill/Patch Appearance: Markings consistent with prescribed medication Bottle Appearance: Standard pharmacy container. Clearly labeled. Filled Date: 8 / 30 / 2023 Last Medication intake:  TodaySafety precautions to be maintained throughout the outpatient stay will include: orient to surroundings, keep bed in low position, maintain call bell within reach at all times, provide assistance with transfer out of bed and ambulation.

## 2021-10-08 ENCOUNTER — Ambulatory Visit (INDEPENDENT_AMBULATORY_CARE_PROVIDER_SITE_OTHER): Payer: Medicare Other | Admitting: Family Medicine

## 2021-10-08 ENCOUNTER — Encounter: Payer: Self-pay | Admitting: Family Medicine

## 2021-10-08 VITALS — BP 146/75 | HR 80 | Temp 98.5°F | Resp 16 | Ht 70.0 in | Wt 198.8 lb

## 2021-10-08 DIAGNOSIS — F5102 Adjustment insomnia: Secondary | ICD-10-CM | POA: Diagnosis not present

## 2021-10-08 DIAGNOSIS — F4322 Adjustment disorder with anxiety: Secondary | ICD-10-CM | POA: Insufficient documentation

## 2021-10-08 MED ORDER — BUSPIRONE HCL 7.5 MG PO TABS: 7.5000 mg | ORAL_TABLET | Freq: Two times a day (BID) | ORAL | 1 refills | Status: DC

## 2021-10-08 MED ORDER — HYDROXYZINE HCL 25 MG PO TABS: 25.0000 mg | ORAL_TABLET | Freq: Every evening | ORAL | 0 refills | Status: DC | PRN

## 2021-10-08 NOTE — Progress Notes (Signed)
Established patient visit  I,Dylan [name],acting as a scribe for [employer], MD.,have documented all relevant documentation on the behalf of Dylan Ramp, MD,as directed by  Dylan Ramp, MD while in the presence of Dylan Ramp, MD.   Patient: Dylan [name]   DOB: 02-03-1950   71 y.o. Male  MRN: [id number] Visit Date: 10/08/2021  Today's healthcare provider: [name], MD   Chief Complaint  Patient presents with   Anxiety   Subjective      Anxiety Presents for initial visit. Onset was in the past 7 days. The problem has been unchanged. Symptoms include depressed mood, insomnia and malaise. Patient reports no chest pain, compulsions, confusion, decreased concentration, dizziness, dry mouth, excessive worry, feeling of choking, hyperventilation, impotence, irritability, muscle tension, nausea, nervous/anxious behavior, obsessions, palpitations, panic, restlessness, shortness of breath or suicidal ideas. Primary symptoms comment: fidgety. Symptoms occur most days. The most recent episode lasted 2 hours. The severity of symptoms is interfering with daily activities and causing significant distress. The symptoms are aggravated by family issues (has a daughter 31years old has a new job moving to [employer] and has been living with him all this years). The quality of sleep is poor. Nighttime awakenings: one to two.   There are no known risk factors. There is no history of anxiety/panic attacks, depression or suicide attempts. Past treatments include nothing.  Insomnia Primary symptoms: fragmented sleep, premature morning awakening.   The current episode started more than one year. The onset quality is gradual. The problem occurs nightly. The problem has been gradually worsening since onset. The symptoms are aggravated by emotional upset. Past treatments include medication. The treatment provided no relief. Typical bedtime:   11-12 P.M..  How long after going to bed to you fall asleep: 15-30 minutes.   PMH includes: chronic pain.  Prior diagnostic workup includes:  No prior workup.        10/08/2021    9:56 AM  Depression screen PHQ 2/9  Decreased Interest 0  Down, Depressed, Hopeless 0  PHQ - 2 Score 0  Altered sleeping 3  Tired, decreased energy 0  Change in appetite 0  Feeling bad or failure about yourself  0  Trouble concentrating 0  Moving slowly or fidgety/restless 0  Suicidal thoughts 0  PHQ-9 Score 3  Difficult doing work/chores Not difficult at all     Medications: Outpatient Medications Prior to Visit  Medication Sig   amLODipine (NORVASC) 10 MG tablet TAKE 1 TABLET BY MOUTH DAILY   atorvastatin (LIPITOR) 40 MG tablet TAKE ONE TABLET BY MOUTH DAILY   docusate sodium (COLACE) 100 MG capsule Take 2 capsules (200 mg total) by mouth 2 (two) times daily.   hydrochlorothiazide (HYDRODIURIL) 25 MG tablet TAKE ONE-HALF TABLET (12.5 MG TOTAL) BY MOUTH DAILY   latanoprost (XALATAN) 0.005 % ophthalmic solution Place into both eyes daily.    tamsulosin (FLOMAX) 0.4 MG CAPS capsule Take 1 capsule (0.4 mg total) by mouth daily.   [START ON 10/16/2021] traMADol (ULTRAM) 50 MG tablet Take 1 tablet (50 mg total) by mouth every 6 (six) hours as needed for severe pain. Each refill must last 30 days.   No facility-administered medications prior to visit.    Review of Systems  Constitutional:  Negative for irritability.  Respiratory:  Negative for shortness of breath.   Cardiovascular:  Negative for chest pain and palpitations.  Gastrointestinal:  Negative for nausea.  Genitourinary:  Negative for impotence.  Neurological:  Negative  for dizziness.  Psychiatric/Behavioral:  Negative for confusion, decreased concentration and suicidal ideas. The patient has insomnia. The patient is not nervous/anxious.       Objective    BP (!) 146/75 (BP Location: Left Arm, Patient Position: Sitting, Cuff Size: Normal)    Pulse 80   Temp 98.5 F (36.9 C) (Oral)   Resp 16   Ht 5\' 10"  (1.778 m)   Wt 198 lb 12.8 oz (90.2 kg)   BMI 28.52 kg/m   Physical Exam Constitutional:      Appearance: Normal appearance. He is not ill-appearing, toxic-appearing or diaphoretic.  Pulmonary:     Effort: Pulmonary effort is normal.  Neurological:     Mental Status: He is alert.  Psychiatric:        Attention and Perception: Attention normal.        Mood and Affect: Mood is anxious. Affect is tearful.        Speech: Speech normal.        Behavior: Behavior is cooperative.        Thought Content: Thought content normal.      No results found for any visits on 10/08/21.  Assessment & Plan     Problem List Items Addressed This Visit       Other   Adjustment reaction with anxiety - Primary    Patient reports intense emotional reaction to upcoming move involving his daughter to [facility] We discussed establishing with a therapist as he identifies talking with others is a helpful coping mechanism, patient was agreeable with this We will place referral for chronic care management to help with connecting patient with counselor ideally in the next 1 to 2 weeks as patient plans to help with his daughters move next week  Symptoms of anxiety and tearful affect all appears to be connected to the change involving his daughters move, will trial BuSpar 7.5 mg twice daily counseled patient that he can use this medication as needed but if finds it to be beneficial, he can take on a daily basis We will plan for follow-up after he is returned from [facility]      Relevant Orders   AMB Referral to [facility]   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     The entirety of the information documented in the History of Present Illness, Review of Systems and Physical Exam were personally obtained by me, Scheryl Sanborn Simmons-Robinson. Portions of this information were initially documented by the CMA and reviewed by me for thoroughness and accuracy.     10/24/2021, MD  [facility] [phone] (phone) [phone] (fax)  [facility]

## 2021-10-08 NOTE — Assessment & Plan Note (Signed)
Chronic issue that has been present for over 1 year Suspect could be worsened by adjustment reaction with anxiety and upcoming move with daughter Symptoms are related to premature weight gain, suspect that this is partially due to patient's schedule while he was working required to wake up at 330 in the mornings Will try hydroxyzine 25 mg as needed at bedtime Patient to follow-up in 2 weeks

## 2021-10-08 NOTE — Assessment & Plan Note (Signed)
Patient reports intense emotional reaction to upcoming move involving his daughter to [facility] We discussed establishing with a therapist as he identifies talking with others is a helpful coping mechanism, patient was agreeable with this We will place referral for chronic care management to help with connecting patient with counselor ideally in the next 1 to 2 weeks as patient plans to help with his daughters move next week  Symptoms of anxiety and tearful affect all appears to be connected to the change involving his daughters move, will trial BuSpar 7.5 mg twice daily counseled patient that he can use this medication as needed but if finds it to be beneficial, he can take on a daily basis We will plan for follow-up after he is returned from [facility]

## 2021-10-09 LAB — TOXASSURE SELECT 13 (MW), URINE

## 2021-10-26 NOTE — Progress Notes (Unsigned)
Established patient visit  I,[name],acting as a scribe for [employer], MD.,have documented all relevant documentation on the behalf of [name], MD,as directed by  [name], MD while in the presence of [name], MD.   Patient: Dylan [name]   DOB: 1950/11/05   71 y.o. Male  MRN: [id number] Visit Date: 10/27/2021  Today's healthcare provider: [name], MD   No chief complaint on file.  Subjective    HPI  Follow up for Adjustment reaction with anxiety  The patient was last seen for this 2-3  weeks ago. Changes made at last visit include will trial BuSpar 7.5 mg twice daily counseled patient that he can use this medication as needed but if finds it to be beneficial, he can take on a daily basis.  He reports excellent compliance with treatment. He feels that condition is Improved. He is not having side effects.   -----------------------------------------------------------------------------------------  Follow up for Adjustment insomnia  The patient was last seen for this 2-3  weeks ago. Changes made at last visit include Will try hydroxyzine 25 mg as needed at bedtime.  He reports excellent compliance with treatment. He feels that condition is Worse. He is not having side effects.   Seems more involved with congestion, often wakes up congested and then can no longer get back to sleep due to breathing  Reports that his mind feels calm but still has a hard time getting to sleep  Tries to do puzzles to get to sleep, often falls asleep at 2 AM  He has been taking 2 tabs of pseudoephedrine, has the four hour version   -----------------------------------------------------------------------------------------      10/27/2021    8:25 AM 10/08/2021   10:00 AM  GAD 7 : Generalized Anxiety Score  Nervous, Anxious, on Edge 0 0  Control/stop worrying 2 1  Worry too much - different things 3 1   Trouble relaxing 3 0  Restless 1 1  Easily annoyed or irritable 0 0  Afraid - awful might happen 0 0  Total GAD 7 Score 9 3  Anxiety Difficulty Not difficult at all Not difficult at all     Medications: Outpatient Medications Prior to Visit  Medication Sig   amLODipine (NORVASC) 10 MG tablet TAKE 1 TABLET BY MOUTH DAILY   atorvastatin (LIPITOR) 40 MG tablet TAKE ONE TABLET BY MOUTH DAILY   docusate sodium (COLACE) 100 MG capsule Take 2 capsules (200 mg total) by mouth 2 (two) times daily.   hydrochlorothiazide (HYDRODIURIL) 25 MG tablet TAKE ONE-HALF TABLET (12.5 MG TOTAL) BY MOUTH DAILY   latanoprost (XALATAN) 0.005 % ophthalmic solution Place into both eyes daily.    tamsulosin (FLOMAX) 0.4 MG CAPS capsule Take 1 capsule (0.4 mg total) by mouth daily.   traMADol (ULTRAM) 50 MG tablet Take 1 tablet (50 mg total) by mouth every 6 (six) hours as needed for severe pain. Each refill must last 30 days.   [DISCONTINUED] busPIRone (BUSPAR) 7.5 MG tablet Take 1 tablet (7.5 mg total) by mouth 2 (two) times daily.   [DISCONTINUED] hydrOXYzine (ATARAX) 25 MG tablet Take 1 tablet (25 mg total) by mouth at bedtime as needed.   No facility-administered medications prior to visit.    Review of Systems  HENT:  Positive for congestion.   Psychiatric/Behavioral:  The patient is nervous/anxious.        Insomnia        Objective    BP (!) 143/80 (BP  Location: Left Arm, Patient Position: Sitting, Cuff Size: Normal)   Pulse 68   Temp 98.3 F (36.8 C) (Oral)   Resp 16   Ht 5\' 10"  (1.778 m)   Wt 196 lb 14.4 oz (89.3 kg)   BMI 28.25 kg/m    Physical Exam Constitutional:      General: He is not in acute distress.    Appearance: Normal appearance. He is not ill-appearing, toxic-appearing or diaphoretic.  HENT:     Ears:     Comments: Buildup of cerumen in bilateral ear canals    Nose:     Right Turbinates: Not enlarged.     Left Turbinates: Enlarged.     Mouth/Throat:     Lips: No  lesions.     Mouth: Mucous membranes are moist.     Tonsils: No tonsillar exudate.  Cardiovascular:     Rate and Rhythm: Normal rate and regular rhythm.     Pulses: Normal pulses.     Heart sounds: No murmur heard.    No friction rub.  Neurological:     Mental Status: He is alert.  Psychiatric:        Attention and Perception: Attention normal.        Mood and Affect: Mood is anxious.        Speech: Speech normal.        Behavior: Behavior normal. Behavior is cooperative.       No results found for any visits on 10/27/21.  Assessment & Plan     Problem List Items Addressed This Visit       Other   Adjustment reaction with anxiety - Primary    Subacute, improving Patient has adjusted well with daughters move He has an appointment scheduled to establish with a therapist on 10/30/2021 He states that his symptoms are improved  he will continue the BuSpar 7.5 mg twice daily Follow-up 2 weeks       Adjustment insomnia    Chronic, worsening Patient states that he has had trouble sleeping for several years and noticed that it recently worsened with his increasing anxiety He currently takes tramadol, consider starting trazodone 50 mg at bedtime however high risk interaction listed with tramadol prescription Discussed this with patient as well as other potentially habit-forming medications Recommended that we control his congestion with nasal spray and see if that improves his sleep Reviewed sleep hygiene recommendations including sleeping in a cool, dark room with little light or sound to interrupt his sleep Recommended against regular use of pseudoephedrine as this can elevate blood pressure Follow-up in 2 weeks      Other Visit Diagnoses     Need for influenza vaccination       Relevant Orders   Flu Vaccine QUAD High Dose(Fluad) (Completed)        Return in about 2 weeks (around 11/10/2021) for insomnia .      I, 01/10/2022, MD, have reviewed all  documentation for this visit. The documentation on 10/27/21 for the exam, diagnosis, procedures, and orders are all accurate and complete.    10/29/21, MD  [facility] [phone] (phone) [phone] (fax)  [facility]

## 2021-10-27 ENCOUNTER — Ambulatory Visit (INDEPENDENT_AMBULATORY_CARE_PROVIDER_SITE_OTHER): Payer: Medicare Other | Admitting: Family Medicine

## 2021-10-27 ENCOUNTER — Encounter: Payer: Self-pay | Admitting: Family Medicine

## 2021-10-27 VITALS — BP 143/80 | HR 68 | Temp 98.3°F | Resp 16 | Ht 70.0 in | Wt 196.9 lb

## 2021-10-27 DIAGNOSIS — F5102 Adjustment insomnia: Secondary | ICD-10-CM

## 2021-10-27 DIAGNOSIS — Z23 Encounter for immunization: Secondary | ICD-10-CM

## 2021-10-27 DIAGNOSIS — F4322 Adjustment disorder with anxiety: Secondary | ICD-10-CM

## 2021-10-27 MED ORDER — AZELASTINE HCL 0.1 % NA SOLN: 2.0000 | Freq: Every day | NASAL | 12 refills | Status: DC

## 2021-10-27 MED ORDER — BUSPIRONE HCL 7.5 MG PO TABS: 7.5000 mg | ORAL_TABLET | Freq: Two times a day (BID) | ORAL | 0 refills | Status: DC

## 2021-10-27 NOTE — Assessment & Plan Note (Signed)
Subacute, improving Patient has adjusted well with daughters move He has an appointment scheduled to establish with a therapist on 10/30/2021 He states that his symptoms are improved  he will continue the BuSpar 7.5 mg twice daily Follow-up 2 weeks

## 2021-10-27 NOTE — Assessment & Plan Note (Addendum)
Chronic, worsening Patient states that he has had trouble sleeping for several years and noticed that it recently worsened with his increasing anxiety He currently takes tramadol, consider starting trazodone 50 mg at bedtime however high risk interaction listed with tramadol prescription Discussed this with patient as well as other potentially habit-forming medications Recommended that we control his congestion with nasal spray and see if that improves his sleep Reviewed sleep hygiene recommendations including sleeping in a cool, dark room with little light or sound to interrupt his sleep Recommended against regular use of pseudoephedrine as this can elevate blood pressure Follow-up in 2 weeks

## 2021-11-09 NOTE — Progress Notes (Unsigned)
I,Dylan [name],acting as a scribe for [employer], MD.,have documented all relevant documentation on the behalf of Dylan Foster, MD,as directed by  Dylan Foster, MD while in the presence of Dylan Foster, MD.   Established patient visit   Patient: Dylan [name]   DOB: 1950-11-11   71 y.o. Male  MRN: [id number] Visit Date: 11/10/2021  Today's healthcare provider: [name], MD   No chief complaint on file.  Subjective    HPI  Anxiety, Follow-up  He was last seen for anxiety 2 weeks ago. Changes made at last visit include will continue the BuSpar 7.5 mg twice daily.  He feels his anxiety is {Desc; severity:60313} and {improved/worse/unchanged:3041574} since last visit.  Symptoms: {Yes/No:20286} chest pain {Yes/No:20286} difficulty concentrating  {Yes/No:20286} dizziness {Yes/No:20286} fatigue  {Yes/No:20286} feelings of losing control {Yes/No:20286} insomnia  {Yes/No:20286} irritable {Yes/No:20286} palpitations  {Yes/No:20286} panic attacks {Yes/No:20286} racing thoughts  {Yes/No:20286} shortness of breath {Yes/No:20286} sweating  {Yes/No:20286} tremors/shakes    GAD-7 Results    10/27/2021    8:25 AM 10/08/2021   10:00 AM  GAD-7 Generalized Anxiety Disorder Screening Tool  1. Feeling Nervous, Anxious, or on Edge 0 0  2. Not Being Able to Stop or Control Worrying 2 1  3. Worrying Too Much About Different Things 3 1  4. Trouble Relaxing 3 0  5. Being So Restless it's Hard To Sit Still 1 1  6. Becoming Easily Annoyed or Irritable 0 0  7. Feeling Afraid As If Something Awful Might Happen 0 0  Total GAD-7 Score 9 3  Difficulty At Work, Home, or Getting  Along With Others? Not difficult at all Not difficult at all    PHQ-9 Scores    10/27/2021    8:25 AM 10/08/2021    9:56 AM 06/09/2021   10:02 AM  PHQ9 SCORE ONLY  PHQ-9 Total Score 5 3 1      ---------------------------------------------------------------------------------------------------  Follow up for Insomnia  The patient was last seen for this 2 weeks ago. Changes made at last visit include Recommended that we control his congestion with nasal spray and see if that improves his sleep.  He reports {excellent/good/fair/poor:19665} compliance with treatment. He feels that condition is {improved/worse/unchanged:3041574}. -----------------------------------------------------------------------------------------  Medications: Outpatient Medications Prior to Visit  Medication Sig   amLODipine (NORVASC) 10 MG tablet TAKE 1 TABLET BY MOUTH DAILY   atorvastatin (LIPITOR) 40 MG tablet TAKE ONE TABLET BY MOUTH DAILY   azelastine (ASTELIN) 0.1 % nasal spray Place 2 sprays into both nostrils daily. Use in each nostril as directed   busPIRone (BUSPAR) 7.5 MG tablet Take 1 tablet (7.5 mg total) by mouth 2 (two) times daily.   docusate sodium (COLACE) 100 MG capsule Take 2 capsules (200 mg total) by mouth 2 (two) times daily.   hydrochlorothiazide (HYDRODIURIL) 25 MG tablet TAKE ONE-HALF TABLET (12.5 MG TOTAL) BY MOUTH DAILY   latanoprost (XALATAN) 0.005 % ophthalmic solution Place into both eyes daily.    tamsulosin (FLOMAX) 0.4 MG CAPS capsule Take 1 capsule (0.4 mg total) by mouth daily.   traMADol (ULTRAM) 50 MG tablet Take 1 tablet (50 mg total) by mouth every 6 (six) hours as needed for severe pain. Each refill must last 30 days.   No facility-administered medications prior to visit.    Review of Systems  {Labs  Heme  Chem  Endocrine  Serology  Results Review (optional):23779}   Objective    There were no vitals taken for this visit. {Show previous vital  signs (optional):23777}  Physical Exam  ***  No results found for any visits on 11/10/21.  Assessment & Plan     ***  No follow-ups on file.      {provider attestation***:1}   Dylan Ramp,  MD  [facility] [phone] (phone) [phone] (fax)  [facility]

## 2021-11-10 ENCOUNTER — Encounter: Payer: Self-pay | Admitting: Family Medicine

## 2021-11-10 ENCOUNTER — Ambulatory Visit (INDEPENDENT_AMBULATORY_CARE_PROVIDER_SITE_OTHER): Payer: Medicare Other | Admitting: Family Medicine

## 2021-11-10 VITALS — BP 138/82 | HR 78 | Temp 98.5°F | Resp 16 | Ht 70.0 in | Wt 196.8 lb

## 2021-11-10 DIAGNOSIS — F5102 Adjustment insomnia: Secondary | ICD-10-CM

## 2021-11-10 DIAGNOSIS — R0981 Nasal congestion: Secondary | ICD-10-CM

## 2021-11-10 DIAGNOSIS — F4322 Adjustment disorder with anxiety: Secondary | ICD-10-CM | POA: Diagnosis not present

## 2021-11-10 NOTE — Assessment & Plan Note (Signed)
Chronic, intermittent problem This is responsive to oral decongestant such as pseudoephedrine Due to patient's blood pressure elevation, recommended that he not use this on a regular basis Recommended they continue to use Astelin nasal spray and plan for patient to bring up nightly nasal congestion with his ENT physician and attempt to schedule an appointment within the next 4-6 weeks

## 2021-11-10 NOTE — Assessment & Plan Note (Signed)
Overall symptoms are improved and essentially resolved We will discontinue buspirone 7.5 mg twice daily

## 2021-11-10 NOTE — Assessment & Plan Note (Signed)
Insomnia is improved after discontinuing BuSpar 7.5 mg twice daily Encourage patient to continue with managing sleep hygiene and sleeping environment Discussed nasal congestion that contributes to difficulty sleeping with plan for patient to follow-up with his ENT physician

## 2021-11-16 DIAGNOSIS — H401132 Primary open-angle glaucoma, bilateral, moderate stage: Secondary | ICD-10-CM | POA: Diagnosis not present

## 2021-12-02 ENCOUNTER — Telehealth: Payer: Self-pay | Admitting: *Deleted

## 2021-12-02 DIAGNOSIS — J3489 Other specified disorders of nose and nasal sinuses: Secondary | ICD-10-CM | POA: Diagnosis not present

## 2021-12-02 DIAGNOSIS — H6123 Impacted cerumen, bilateral: Secondary | ICD-10-CM | POA: Diagnosis not present

## 2021-12-02 DIAGNOSIS — J301 Allergic rhinitis due to pollen: Secondary | ICD-10-CM | POA: Diagnosis not present

## 2021-12-02 NOTE — Chronic Care Management (AMB) (Signed)
  Care Coordination   Note   12/02/2021 Name: Dylan [name] MRN: [id number] DOB: Oct 12, 1950  Dylan [name] is a 71 y.o. year old male who sees Simmons-Robinson, Riki Sheer, MD for primary care. I reached out to Yehuda Mao by phone today to offer care coordination services.  [name] was given information about Care Coordination services today including:   The Care Coordination services include support from the care team which includes your Nurse Coordinator, Clinical Social Worker, or Pharmacist.  The Care Coordination team is here to help remove barriers to the health concerns and goals most important to you. Care Coordination services are voluntary, and the patient may decline or stop services at any time by request to their care team member.   Care Coordination Consent Status: Patient did not agree to participate in care coordination services at this time.  Follow up plan:  pt appreciative but has already established with counselor   Encounter Outcome:  Pt. Refused  Julian Hy, [place] Direct Dial: [phone]

## 2021-12-21 ENCOUNTER — Telehealth: Payer: Self-pay | Admitting: Family Medicine

## 2021-12-21 DIAGNOSIS — I10 Essential (primary) hypertension: Secondary | ICD-10-CM

## 2021-12-21 MED ORDER — AMLODIPINE BESYLATE 10 MG PO TABS: 10.0000 mg | ORAL_TABLET | Freq: Every day | ORAL | 1 refills | Status: DC

## 2021-12-21 NOTE — Telephone Encounter (Signed)
[facility] pharmacy faxed refill request for the following medications:   amLODipine (NORVASC) 10 MG tablet    Please advise

## 2021-12-21 NOTE — Telephone Encounter (Signed)
Refill for amlodipine 10mg  sent to pharmacy.

## 2022-02-03 DIAGNOSIS — K080 Exfoliation of teeth due to systemic causes: Secondary | ICD-10-CM | POA: Diagnosis not present

## 2022-03-15 ENCOUNTER — Ambulatory Visit: Payer: Medicare Other | Admitting: Family Medicine

## 2022-04-06 NOTE — Progress Notes (Unsigned)
PROVIDER NOTE: Information contained herein reflects review and annotations entered in association with encounter. Interpretation of such information and data should be left to medically-trained personnel. Information provided to patient can be located elsewhere in the medical record under "Patient Instructions". Document created using STT-dictation technology, any transcriptional errors that may result from process are unintentional.    Patient: Dylan [name]  Service Category: E/M  Provider: Gaspar Cola, MD  DOB: 1950/08/01  DOS: 04/07/2022  Referring Provider: Eulas Post, MD  MRN: [id number]  Specialty: Interventional Pain Management  PCP: [name], MD  Type: Established Patient  Setting: Ambulatory outpatient    Location: Office  Delivery: Face-to-face     HPI  [name], a 72 y.o. year old male, is here today because of his No primary diagnosis found.. Mr. Pullara's primary complain today is No chief complaint on file. Last encounter: My last encounter with him was on Visit date not found. Pertinent problems: Mr. 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 Pain Assessment: Severity of   is reported as a  /10. Location:    / . Onset:  . Quality:  . Timing:  . Modifying factor(s):  Marland Kitchen Vitals:  vitals were not taken for this visit.  BMI: Estimated body mass index is 28.24 kg/m as calculated from the following:   Height as of 11/10/21: '5\' 10"'$  (1.778 m).   Weight as of 11/10/21: 196 lb 12.8 oz (89.3 kg).  Reason for encounter: medication management. ***  RTCB: 10/11/2022   Pharmacotherapy Assessment  Analgesic: Tramadol 50 mg, 1 tab PO q6 hrs (200 mg/day) MME/day: 20  mg/day.   Monitoring: [place] PMP: PDMP reviewed during this encounter.       Pharmacotherapy: No side-effects or adverse reactions reported. Compliance: No problems identified. Effectiveness: Clinically acceptable.  No notes on file  No results found for: "CBDTHCR" No results found for: "D8THCCBX" No results found for: "D9THCCBX"  UDS:  Summary  Date Value Ref Range Status  10/07/2021 Note  Final    Comment:    ==================================================================== [employer] 13 (MW) ==================================================================== Test                             Result       Flag       Units  Drug Present and Declared for Prescription Verification   Tramadol                       >2660        EXPECTED   ng/mg creat   O-Desmethyltramadol            >2660        EXPECTED   ng/mg creat   N-Desmethyltramadol            1484         EXPECTED   ng/mg creat    Source of tramadol is a prescription medication. O-desmethyltramadol    and N-desmethyltramadol are expected metabolites of tramadol.  ==================================================================== Test                      Result    Flag   Units  Ref Range   Creatinine              188              mg/dL      >=20 ==================================================================== Declared Medications:  The flagging and interpretation on this report are based on the  following declared medications.  Unexpected results may arise from  inaccuracies in the declared medications.   **Note: The testing scope of this panel includes these medications:   Tramadol (Ultram)   **Note: The testing scope of this panel does not include the  following reported medications:   Amlodipine  Atorvastatin  Docusate  Eye Drop  Hydrochlorothiazide  Tamsulosin ==================================================================== For clinical consultation, please call [phone]. ====================================================================       ROS  Constitutional: Denies any fever or chills Gastrointestinal: No reported hemesis, hematochezia, vomiting, or acute GI distress Musculoskeletal: Denies any acute onset joint swelling, redness, loss of ROM, or weakness Neurological: No reported episodes of acute onset apraxia, aphasia, dysarthria, agnosia, amnesia, paralysis, loss of coordination, or loss of consciousness  Medication Review  amLODipine, atorvastatin, azelastine, docusate sodium, hydrochlorothiazide, latanoprost, tamsulosin, and traMADol  History Review  Allergy: Mr. Corella has No Known Allergies. Drug: Mr. Venditto  reports no history of drug use. Alcohol:  reports that he does not currently use alcohol. Tobacco:  reports that he has never smoked. He has never used smokeless tobacco. Social: Mr. Schlueter  reports that he has never smoked. He has never used smokeless tobacco. He reports that he does not currently use alcohol. He reports that he does not use drugs. Medical:  has a past medical history of Genital disorder, male (08/16/2005), Hypertension, Lumbago (01/17/2009), Pain syndrome, chronic, and Sciatica. Surgical: Mr. Recio  has a past surgical history that includes Back surgery (02/01/2009); Cystoscopy with insertion of urolift; and Cystoscopy with insertion of urolift (N/A, 09/03/2021). Family: family history is not on file.  Laboratory Chemistry Profile   Renal Lab Results  Component Value Date   BUN 14 07/08/2021   CREATININE 0.90 07/08/2021   BCR 16 07/08/2021   GFRAA 104 12/20/2019   GFRNONAA 90 12/20/2019    Hepatic Lab Results  Component Value Date   AST 18 07/08/2021   ALT 12 07/08/2021   ALBUMIN 4.7 07/08/2021   ALKPHOS 87 07/08/2021   HCVAB NEGATIVE 02/28/2009    Electrolytes Lab Results  Component Value Date   NA 140 07/08/2021   K 3.9 07/08/2021   CL 99 07/08/2021   CALCIUM 9.5 07/08/2021   MG 2.5  (H) 12/08/2018   PHOS 3.3 10/14/2011    Bone Lab Results  Component Value Date   VD25OH 25.44 (L) 12/08/2018   25OHVITD1 34 01/19/2016   25OHVITD2 1.2 01/19/2016   25OHVITD3 33 01/19/2016    Inflammation (CRP: Acute Phase) (ESR: Chronic Phase) Lab Results  Component Value Date   CRP 2.0 (H) 12/08/2018   ESRSEDRATE 9 12/08/2018         Note: Above Lab results reviewed.  Recent Imaging Review  CT ABDOMEN PELVIS W WO CONTRAST CLINICAL DATA:  Gross hematuria  EXAM: CT ABDOMEN AND PELVIS WITHOUT AND WITH CONTRAST  TECHNIQUE: Multidetector CT imaging of the abdomen and pelvis was performed following the standard protocol before and following the bolus administration of intravenous contrast.  CONTRAST:  184m ISOVUE-300 IOPAMIDOL (ISOVUE-300) INJECTION 61%  COMPARISON:  CT 03/23/2008  FINDINGS: Lower chest: Lung bases are clear.  Hepatobiliary: No focal hepatic lesion. No biliary duct dilatation. Gallbladder  is normal. Common bile duct is normal.  Pancreas: Pancreas is normal. No ductal dilatation. No pancreatic inflammation.  Spleen: Normal spleen  Adrenals/urinary tract: Adrenal glands are normal. No nephrolithiasis ureterolithiasis. No enhancing renal cortical lesion. No filling defects within collecting systems or ureters. No bladder calculi, enhancing bladder lesions, or filling defect within the bladder.  Stomach/Bowel: Stomach, small bowel, appendix, and cecum are normal. The colon and rectosigmoid colon are normal.  Vascular/Lymphatic: Abdominal aorta is normal caliber with atherosclerotic calcification. There is no retroperitoneal or periportal lymphadenopathy. No pelvic lymphadenopathy.  Reproductive: Prostate normal  Other: No free fluid.  Musculoskeletal: No aggressive osseous lesion.  IMPRESSION: 1. No explanation for hematuria. No nephrolithiasis, ureterolithiasis, enhancing renal cortical lesion, or filling defects within the collecting  systems. 2. No bladder stones or filling defects in the bladder which does not excluded a bladder lesion.  Electronically Signed   By: Suzy Bouchard M.D.   On: 11/26/2016 09:22 Note: Reviewed        Physical Exam  General appearance: Well nourished, well developed, and well hydrated. In no apparent acute distress Mental status: Alert, oriented x 3 (person, place, & time)       Respiratory: No evidence of acute respiratory distress Eyes: PERLA Vitals: There were no vitals taken for this visit. BMI: Estimated body mass index is 28.24 kg/m as calculated from the following:   Height as of 11/10/21: '5\' 10"'$  (1.778 m).   Weight as of 11/10/21: 196 lb 12.8 oz (89.3 kg). Ideal: Patient weight not recorded  Assessment   Diagnosis Status  1. Chronic pain syndrome   2. 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 Pharmacologic therapy   4. Orchialgia (1ry area of Pain) (Left)   5. Neurogenic pain   6. Encounter for medication management   7. Encounter for chronic pain management   8. Chronic use of opiate for therapeutic purpose   9. Chronic groin pain (2ry area of Pain) (Left)    Controlled Controlled Controlled   Updated Problems: No problems updated.  Plan of Care  Problem-specific:  No problem-specific Assessment & Plan notes found for this encounter.  Mr. Achille Millam Mcevoy has a current medication list which includes the following long-term medication(s): amlodipine, atorvastatin, azelastine, hydrochlorothiazide, and tramadol.  Pharmacotherapy (Medications Ordered): No orders of the defined types were placed in this encounter.  Orders:  No orders of the defined types were placed in this encounter.  Follow-up plan:   No follow-ups on file.      Interventional Therapies  Risk  Complexity Considerations:   Estimated body mass index is 26.5 kg/m as calculated from the following:   Height as of this encounter: '5\' 11"'$  (1.803 m).   Weight as of this encounter: 190 lb (86.2  kg). WNL   Planned  Pending:      Under consideration:      Completed:   None since initial evaluation   Therapeutic  Palliative (PRN) options:   None       Recent Visits No visits were found meeting these conditions. Showing recent visits within past 90 days and meeting all other requirements Future Appointments Date Type Provider Dept  04/07/22 Appointment Milinda Pointer, MD [facility]  Showing future appointments within next 90 days and meeting all other requirements  I discussed the assessment and treatment plan with the patient. The patient was provided an opportunity to ask questions and all were answered. The patient agreed with the plan and demonstrated an understanding  of the instructions.  Patient advised to call back or seek an in-person evaluation if the symptoms or condition worsens.  Duration of encounter: *** minutes.  Total time on encounter, as per AMA guidelines included both the face-to-face and non-face-to-face time personally spent by the physician and/or other qualified health care professional(s) on the day of the encounter (includes time in activities that require the physician or other qualified health care professional and does not include time in activities normally performed by clinical staff). Physician's time may include the following activities when performed: Preparing to see the patient (e.g., pre-charting review of records, searching for previously ordered imaging, lab work, and nerve conduction tests) Review of prior analgesic pharmacotherapies. Reviewing PMP Interpreting ordered tests (e.g., lab work, imaging, nerve conduction tests) Performing post-procedure evaluations, including interpretation of diagnostic procedures Obtaining and/or reviewing separately obtained history Performing a medically appropriate examination and/or evaluation Counseling and educating the patient/family/caregiver Ordering medications, tests, or  procedures Referring and communicating with other health care professionals (when not separately reported) Documenting clinical information in the electronic or other health record Independently interpreting results (not separately reported) and communicating results to the patient/ family/caregiver Care coordination (not separately reported)  Note by: Gaspar Cola, MD Date: 04/07/2022; Time: 3:27 PM

## 2022-04-07 ENCOUNTER — Encounter: Payer: Self-pay | Admitting: Pain Medicine

## 2022-04-07 ENCOUNTER — Ambulatory Visit: Payer: Medicare Other | Attending: Pain Medicine | Admitting: Pain Medicine

## 2022-04-07 DIAGNOSIS — Z79899 Other long term (current) drug therapy: Secondary | ICD-10-CM | POA: Diagnosis not present

## 2022-04-07 DIAGNOSIS — M792 Neuralgia and neuritis, unspecified: Secondary | ICD-10-CM | POA: Insufficient documentation

## 2022-04-07 DIAGNOSIS — N509 Disorder of male genital organs, unspecified: Secondary | ICD-10-CM | POA: Insufficient documentation

## 2022-04-07 DIAGNOSIS — G894 Chronic pain syndrome: Secondary | ICD-10-CM | POA: Insufficient documentation

## 2022-04-07 DIAGNOSIS — Z79891 Long term (current) use of opiate analgesic: Secondary | ICD-10-CM | POA: Insufficient documentation

## 2022-04-07 DIAGNOSIS — N50812 Left testicular pain: Secondary | ICD-10-CM | POA: Insufficient documentation

## 2022-04-07 DIAGNOSIS — G8929 Other chronic pain: Secondary | ICD-10-CM | POA: Diagnosis not present

## 2022-04-07 DIAGNOSIS — R1032 Left lower quadrant pain: Secondary | ICD-10-CM | POA: Diagnosis not present

## 2022-04-07 MED ORDER — TRAMADOL HCL 50 MG PO TABS: 50.0000 mg | ORAL_TABLET | Freq: Four times a day (QID) | ORAL | 5 refills | Status: DC | PRN

## 2022-04-07 NOTE — Patient Instructions (Signed)
____________________________________________________________________________________________  Patient Information update  To: All of our patients.  Re: Name change.  It has been made official that our current name, "[facility]"   will soon be changed to "[facility]".   The purpose of this change is to eliminate any confusion created by the concept of our practice being a "Medication Management Pain Clinic". In the past this has led to the misconception that we treat pain primarily by the use of prescription medications.  Nothing can be farther from the truth.   Understanding PAIN MANAGEMENT: To further understand what our practice does, you first have to understand that "Pain Management" is a subspecialty that requires additional training once a physician has completed their specialty training, which can be in either Anesthesia, Neurology, Psychiatry, or Physical Medicine and Rehabilitation (PMR). Each one of these contributes to the final approach taken by each physician to the management of their patient's pain. To be a "Pain Management Specialist" you must have first completed one of the specialty trainings below.  Anesthesiologists - trained in clinical pharmacology and interventional techniques such as nerve blockade and regional as well as central neuroanatomy. They are trained to block pain before, during, and after surgical interventions.  Neurologists - trained in the diagnosis and pharmacological treatment of complex neurological conditions, such as Multiple Sclerosis, Parkinson's, spinal cord injuries, and other systemic conditions that may be associated with symptoms that may include but are not limited to pain. They tend to rely primarily on the treatment of chronic pain using prescription medications.  Psychiatrist - trained in conditions affecting the psychosocial  wellbeing of patients including but not limited to depression, anxiety, schizophrenia, personality disorders, addiction, and other substance use disorders that may be associated with chronic pain. They tend to rely primarily on the treatment of chronic pain using prescription medications.   Physical Medicine and Rehabilitation (PMR) physicians, also known as physiatrists - trained to treat a wide variety of medical conditions affecting the brain, spinal cord, nerves, bones, joints, ligaments, muscles, and tendons. Their training is primarily aimed at treating patients that have suffered injuries that have caused severe physical impairment. Their training is primarily aimed at the physical therapy and rehabilitation of those patients. They may also work alongside orthopedic surgeons or neurosurgeons using their expertise in assisting surgical patients to recover after their surgeries.  INTERVENTIONAL PAIN MANAGEMENT is sub-subspecialty of Pain Management.  Our physicians are Board-certified in Anesthesia, Pain Management, and Interventional Pain Management.  This meaning that not only have they been trained and Board-certified in their specialty of Anesthesia, and subspecialty of Pain Management, but they have also received further training in the sub-subspecialty of Interventional Pain Management, in order to become Board-certified as INTERVENTIONAL PAIN MANAGEMENT SPECIALIST.    Mission: Our goal is to use our skills in  [place] as alternatives to the chronic use of prescription opioid medications for the treatment of pain. To make this more clear, we have changed our name to reflect what we do and offer. We will continue to offer medication management assessment and recommendations, but we will not be taking over any patient's medication management.  ____________________________________________________________________________________________      ____________________________________________________________________________________________  Opioid Pain Medication Update  To: All patients taking opioid pain medications. (I.e.: hydrocodone, hydromorphone, oxycodone, oxymorphone, morphine, codeine, methadone, tapentadol, tramadol, buprenorphine, fentanyl, etc.)  Re: Updated review of side effects and adverse reactions of opioid analgesics, as well as new  information about long term effects of this class of medications.  Direct risks of long-term opioid therapy are not limited to opioid addiction and overdose. Potential medical risks include serious fractures, breathing problems during sleep, hyperalgesia, immunosuppression, chronic constipation, bowel obstruction, myocardial infarction, and tooth decay secondary to xerostomia.  Unpredictable adverse effects that can occur even if you take your medication correctly: Cognitive impairment, respiratory depression, and death. Most people think that if they take their medication "correctly", and "as instructed", that they will be safe. Nothing could be farther from the truth. In reality, a significant amount of recorded deaths associated with the use of opioids has occurred in individuals that had taken the medication for a long time, and were taking their medication correctly. The following are examples of how this can happen: Patient taking his/her medication for a long time, as instructed, without any side effects, is given a certain antibiotic or another unrelated medication, which in turn triggers a "Drug-to-drug interaction" leading to disorientation, cognitive impairment, impaired reflexes, respiratory depression or an untoward event leading to serious bodily harm or injury, including death.  Patient taking his/her medication for a long time, as instructed, without any side effects, develops an acute impairment of liver and/or kidney function. This will lead to a rapid inability of the body to  breakdown and eliminate their pain medication, which will result in effects similar to an "overdose", but with the same medicine and dose that they had always taken. This again may lead to disorientation, cognitive impairment, impaired reflexes, respiratory depression or an untoward event leading to serious bodily harm or injury, including death.  A similar problem will occur with patients as they grow older and their liver and kidney function begins to decrease as part of the aging process.  Background information: Historically, the original case for using long-term opioid therapy to treat chronic noncancer pain was based on safety assumptions that subsequent experience has called into question. In 1996, the American Pain Society and the [place] [employer] issued a consensus statement supporting long-term opioid therapy. This statement acknowledged the dangers of opioid prescribing but concluded that the risk for addiction was low; respiratory depression induced by opioids was short-lived, occurred mainly in opioid-naive patients, and was antagonized by pain; tolerance was not a common problem; and efforts to control diversion should not constrain opioid prescribing. This has now proven to be wrong. Experience regarding the risks for opioid addiction, misuse, and overdose in community practice has failed to support these assumptions.  According to the Centers for Disease Control and Prevention, fatal overdoses involving opioid analgesics have increased sharply over the past decade. Currently, more than 96,700 people die from drug overdoses every year. Opioids are a factor in 7 out of every 10 overdose deaths. Deaths from drug overdose have surpassed motor vehicle accidents as the leading cause of death for individuals between the ages of 9 and 73.  Clinical data suggest that neuroendocrine dysfunction may be very common in both men and women, potentially causing hypogonadism, erectile  dysfunction, infertility, decreased libido, osteoporosis, and depression. Recent studies linked higher opioid dose to increased opioid-related mortality. Controlled observational studies reported that long-term opioid therapy may be associated with increased risk for cardiovascular events. Subsequent meta-analysis concluded that the safety of long-term opioid therapy in elderly patients has not been proven.   Side Effects and adverse reactions: Common side effects: Drowsiness (sedation). Dizziness. Nausea and vomiting. Constipation. Physical dependence -- Dependence often manifests with withdrawal symptoms when opioids are discontinued  or decreased. Tolerance -- As you take repeated doses of opioids, you require increased medication to experience the same effect of pain relief. Respiratory depression -- This can occur in healthy people, especially with higher doses. However, people with COPD, asthma or other lung conditions may be even more susceptible to fatal respiratory impairment.  Uncommon side effects: An increased sensitivity to feeling pain and extreme response to pain (hyperalgesia). Chronic use of opioids can lead to this. Delayed gastric emptying (the process by which the contents of your stomach are moved into your small intestine). Muscle rigidity. Immune system and hormonal dysfunction. Quick, involuntary muscle jerks (myoclonus). Arrhythmia. Itchy skin (pruritus). Dry mouth (xerostomia).  Long-term side effects: Chronic constipation. Sleep-disordered breathing (SDB). Increased risk of bone fractures. Hypothalamic-pituitary-adrenal dysregulation. Increased risk of overdose.  RISKS: Fractures and Falls:  Opioids increase the risk and incidence of falls. This is of particular importance in elderly patients.  Endocrine System:  Long-term administration is associated with endocrine abnormalities (endocrinopathies). (Also known as Opioid-induced Endocrinopathy) Influences  on both the hypothalamic-pituitary-adrenal axis?and the hypothalamic-pituitary-gonadal axis have been demonstrated with consequent hypogonadism and adrenal insufficiency in both sexes. Hypogonadism and decreased levels of dehydroepiandrosterone sulfate have been reported in men and women. Endocrine effects include: Amenorrhoea in women (abnormal absence of menstruation) Reduced libido in both sexes Decreased sexual function Erectile dysfunction in men Hypogonadisms (decreased testicular function with shrinkage of testicles) Infertility Depression and fatigue Loss of muscle mass Anxiety Depression Immune suppression Hyperalgesia Weight gain Anemia Osteoporosis Patients (particularly women of childbearing age) should avoid opioids. There is insufficient evidence to recommend routine monitoring of asymptomatic patients taking opioids in the long-term for hormonal deficiencies.  Immune System: Human studies have demonstrated that opioids have an immunomodulating effect. These effects are mediated via opioid receptors both on immune effector cells and in the central nervous system. Opioids have been demonstrated to have adverse effects on antimicrobial response and anti-tumour surveillance. Buprenorphine has been demonstrated to have no impact on immune function.  Opioid Induced Hyperalgesia: Human studies have demonstrated that prolonged use of opioids can lead to a state of abnormal pain sensitivity, sometimes called opioid induced hyperalgesia (OIH). Opioid induced hyperalgesia is not usually seen in the absence of tolerance to opioid analgesia. Clinically, hyperalgesia may be diagnosed if the patient on long-term opioid therapy presents with increased pain. This might be qualitatively and anatomically distinct from pain related to disease progression or to breakthrough pain resulting from development of opioid tolerance. Pain associated with hyperalgesia tends to be more diffuse than the  pre-existing pain and less defined in quality. Management of opioid induced hyperalgesia requires opioid dose reduction.  Cancer: Chronic opioid therapy has been associated with an increased risk of cancer among noncancer patients with chronic pain. This association was more evident in chronic strong opioid users. Chronic opioid consumption causes significant pathological changes in the small intestine and colon. Epidemiological studies have found that there is a link between opium dependence and initiation of gastrointestinal cancers. Cancer is the second leading cause of death after cardiovascular disease. Chronic use of opioids can cause multiple conditions such as GERD, immunosuppression and renal damage as well as carcinogenic effects, which are associated with the incidence of cancers.   Mortality: Long-term opioid use has been associated with increased mortality among patients with chronic non-cancer pain (CNCP).  Prescription of long-acting opioids for chronic noncancer pain was associated with a significantly increased risk of all-cause mortality, including deaths from causes other than overdose.  Reference: Von Glenetta Hew,  Kolodny A, Deyo RA, Chou R. Long-term opioid therapy reconsidered. Ann Intern Med. 2011 Sep 6;155(5):325-8. doi: 10.7326/[id number]. PMID: [id number]; PMCIDXX:1631110. Morley Kos, Hayward RA, Dunn KM, [place] KP. Risk of adverse events in patients prescribed long-term opioids: A cohort study in the [place] Clinical Practice Research [employer]. Eur J Pain. 2019 May;23(5):908-922. doi: 10.1002/ejp.1357. Epub 2019 Jan 31. PMID: [id number]. Colameco S, Coren JS, Ciervo CA. Continuous opioid treatment for chronic noncancer pain: a time for moderation in prescribing. Postgrad Med. 2009 Jul;121(4):61-6. doi: 10.3810/pgm.2009.07.2032. PMID: [id number]. Heywood Bene RN, [place] SD, Blazina I, Rosalio Loud, Bougatsos C, Deyo RA. The  effectiveness and risks of long-term opioid therapy for chronic pain: a systematic review for a [employer] of Health Pathways to [employer]. Ann Intern Med. 2015 Feb 17;162(4):276-86. doi: [id number]. PMID: [id number]. [facility], [place] DM. NCHS Data Brief No. 22. [place]: Centers for Disease Control and Prevention; 2009. Sep, Increase in Fatal Poisonings Involving Opioid Analgesics in the [place], 1999-2006. Song IA, Choi HR, Oh TK. Long-term opioid use and mortality in patients with chronic non-cancer pain: Ten-year follow-up study in [place] from 2010 through 2019. EClinicalMedicine. 2022 Jul 18;51:101558. doi: 10.1016/j.eclinm.2022.UB:5887891. PMID: [id number]; PMCIDOX:8550940. Huser, W., Schubert, T., Vogelmann, T. et al. All-cause mortality in patients with long-term opioid therapy compared with non-opioid analgesics for chronic non-cancer pain: a database study. [place] Med 18, 162 (2020). [email or web address] Rashidian H, Roxy Cedar, Malekzadeh R, Haghdoost AA. An Ecological Study of the Association between Opiate Use and Incidence of Cancers. Addict Health. 2016 Fall;8(4):252-260. PMID: [id number]; PMCIDQI:9185013.  Our Goal: Our goal is to control your pain with means other than the use of opioid pain medications.  Our Recommendation: Talk to your physician about coming off of these medications. We can assist you with the tapering down and stopping these medicines. Based on the new information, even if you cannot completely stop the medication, a decrease in the dose may be associated with a lesser risk. Ask for other means of controlling the pain. Decrease or eliminate those factors that significantly contribute to your pain such as smoking, obesity, and a diet heavily tilted towards "inflammatory" nutrients.  Last Updated: 03/31/2022    ____________________________________________________________________________________________     ____________________________________________________________________________________________  National Pain Medication Shortage  The [place] is experiencing worsening drug shortages. These have had a negative widespread effect on patient care and treatment. Not expected to improve any time soon. Predicted to last past 2029.   Drug shortage list (generic names) Oxycodone IR Oxycodone/APAP Oxymorphone IR Hydromorphone Hydrocodone/APAP Morphine  Where is the problem?  Manufacturing and supply level.  Will this shortage affect you?  Only if you take any of the above pain medications.  How? You may be unable to fill your prescription.  Your pharmacist may offer a "partial fill" of your prescription. (Warning: Do not accept partial fills.) Prescriptions partially filled cannot be transferred to another pharmacy. Read our Medication Rules and Regulation. Depending on how much medicine you are dependent on, you may experience withdrawals when unable to get the medication.  Recommendations: Consider ending your dependence on opioid pain medications. Ask your pain specialist to assist you with the process. Consider switching to a medication currently not in shortage, such as Buprenorphine. Talk to your pain specialist about this option. Consider decreasing your pain medication requirements by managing tolerance thru "Drug Holidays". This may help minimize withdrawals, should you run out of medicine. Control your pain thru  the use of non-pharmacological interventional therapies.   Your prescriber: Prescribers cannot be blamed for shortages. Medication manufacturing and supply issues cannot be fixed by the prescriber.   NOTE: The prescriber is not responsible for supplying the medication, or solving supply issues. Work with your pharmacist to solve it. The patient is responsible for the decision  to take or continue taking the medication and for identifying and securing a legal supply source. By law, supplying the medication is the job and responsibility of the pharmacy. The prescriber is responsible for the evaluation, monitoring, and prescribing of these medications.   Prescribers will NOT: Re-issue prescriptions that have been partially filled. Re-issue prescriptions already sent to a pharmacy.  Re-send prescriptions to a different pharmacy because yours did not have your medication. Ask pharmacist to order more medicine or transfer the prescription to another pharmacy. (Read below.)  New 2023 regulation: "October 02, 2021 Revised Regulation Allows DEA-Registered Pharmacies to Transfer Electronic Prescriptions at a Patient's Request [place] Patients now have the ability to request their electronic prescription be transferred to another pharmacy without having to go back to their practitioner to initiate the request. This revised regulation went into effect on Monday, September 28, 2021.     At a patient's request, a DEA-registered retail pharmacy can now transfer an electronic prescription for a controlled substance (schedules II-V) to another DEA-registered retail pharmacy. Prior to this change, patients would have to go through their practitioner to cancel their prescription and have it re-issued to a different pharmacy. The process was taxing and time consuming for both patients and practitioners.    The Drug Enforcement Administration [facility]) published its intent to revise the process for transferring electronic prescriptions on December 21, 2019.  The final rule was published in the federal register on August 27, 2021 and went into effect 30 days later.  Under the final rule, a prescription can only be transferred once between pharmacies, and only if allowed under existing state or other applicable law. The prescription must remain in its  electronic form; may not be altered in any way; and the transfer must be communicated directly between two licensed pharmacists. It's important to note, any authorized refills transfer with the original prescription, which means the entire prescription will be filled at the same pharmacy".  Reference: [email or web address].at [facility] website announcement)  [email or web address].pdf ([place])   [employer] / Vol. 88, No. 143 / Thursday, August 27, 2021 / Rules and Regulations DEPARTMENT OF JUSTICE  Drug Enforcement Administration  21 CFR Part 1306  [Docket No. [id number]]  RIN [id number] Transfer of Electronic Prescriptions for Schedules II-V Controlled Substances Between Pharmacies for Initial Filling  ____________________________________________________________________________________________     ____________________________________________________________________________________________  Transfer of Pain Medication between Pharmacies  Re: 2023 DEA Clarification on existing regulation  Published on DEA Website: October 02, 2021  Title: Revised Regulation Allows DEA-Registered Pharmacies to Conservator, museum/gallery Prescriptions at a Patient's Request [place]  "Patients now have the ability to request their electronic prescription be transferred to another pharmacy without having to go back to their practitioner to initiate the request. This revised regulation went into effect on Monday, September 28, 2021.     At a patient's request, a DEA-registered retail pharmacy can now transfer an electronic prescription for a controlled substance (schedules II-V) to another DEA-registered retail pharmacy. Prior to this change, patients would have to go through their practitioner to cancel their  prescription  and have it re-issued to a different pharmacy. The process was taxing and time consuming for both patients and practitioners.    The Drug Enforcement Administration [facility]) published its intent to revise the process for transferring electronic prescriptions on December 21, 2019.  The final rule was published in the federal register on August 27, 2021 and went into effect 30 days later.  Under the final rule, a prescription can only be transferred once between pharmacies, and only if allowed under existing state or other applicable law. The prescription must remain in its electronic form; may not be altered in any way; and the transfer must be communicated directly between two licensed pharmacists. It's important to note, any authorized refills transfer with the original prescription, which means the entire prescription will be filled at the same pharmacy."    REFERENCES: 1. DEA website announcement [email or web address]  2. Department of Justice website  [email or web address].pdf  3. DEPARTMENT OF JUSTICE Drug Enforcement Administration 21 CFR Part 1306 [Docket No. [id number]] RIN [id number] "Transfer of Electronic Prescriptions for Schedules II-V Controlled Substances Between Pharmacies for Initial Filling"  ____________________________________________________________________________________________     _______________________________________________________________________  Medication Rules  Purpose: To inform patients, and their family members, of our medication rules and regulations.  Applies to: All patients receiving prescriptions from our practice (written or electronic).  Pharmacy of record: This is the pharmacy where your electronic prescriptions will be sent. Make sure we have the correct one.  Electronic prescriptions: In compliance  with the [place] (STOP) Act of 2017 (Session Lanny Cramp [phone]), effective February 01, 2018, all controlled substances must be electronically prescribed. Written prescriptions, faxing, or calling prescriptions to a pharmacy will no longer be done.  Prescription refills: These will be provided only during in-person appointments. No medications will be renewed without a "face-to-face" evaluation with your provider. Applies to all prescriptions.  NOTE: The following applies primarily to controlled substances (Opioid* Pain Medications).   Type of encounter (visit): For patients receiving controlled substances, face-to-face visits are required. (Not an option and not up to the patient.)  Patient's responsibilities: Pain Pills: Bring all pain pills to every appointment (except for procedure appointments). Pill Bottles: Bring pills in original pharmacy bottle. Bring bottle, even if empty. Always bring the bottle of the most recent fill.  Medication refills: You are responsible for knowing and keeping track of what medications you are taking and when is it that you will need a refill. The day before your appointment: write a list of all prescriptions that need to be refilled. The day of the appointment: give the list to the admitting nurse. Prescriptions will be written only during appointments. No prescriptions will be written on procedure days. If you forget a medication: it will not be "Called in", "Faxed", or "electronically sent". You will need to get another appointment to get these prescribed. No early refills. Do not call asking to have your prescription filled early. Partial  or short prescriptions: Occasionally your pharmacy may not have enough pills to fill your prescription.  NEVER ACCEPT a partial fill or a prescription that is short of the total amount of pills that you were prescribed.  With controlled substances the law allows 72 hours for the pharmacy  to complete the prescription.  If the prescription is not completed within 72 hours, the pharmacist will require a new prescription to be written. This means that you will be short on your medicine and we WILL NOT send another prescription to complete your original  prescription.  Instead, request the pharmacy to send a carrier to a nearby branch to get enough medication to provide you with your full prescription. Prescription Accuracy: You are responsible for carefully inspecting your prescriptions before leaving our office. Have the discharge nurse carefully go over each prescription with you, before taking them home. Make sure that your name is accurately spelled, that your address is correct. Check the name and dose of your medication to make sure it is accurate. Check the number of pills, and the written instructions to make sure they are clear and accurate. Make sure that you are given enough medication to last until your next medication refill appointment. Taking Medication: Take medication as prescribed. When it comes to controlled substances, taking less pills or less frequently than prescribed is permitted and encouraged. Never take more pills than instructed. Never take the medication more frequently than prescribed.  Inform other Doctors: Always inform, all of your healthcare providers, of all the medications you take. Pain Medication from other Providers: You are not allowed to accept any additional pain medication from any other Doctor or Healthcare provider. There are two exceptions to this rule. (see below) In the event that you require additional pain medication, you are responsible for notifying us, as stated below. Cough Medicine: Often these contain an opioid, such as codeine or hydrocodone. Never accept or take cough medicine containing these opioids if you are already taking an opioid* medication. The combination may cause respiratory failure and death. Medication Agreement: You are  responsible for carefully reading and following our Medication Agreement. This must be signed before receiving any prescriptions from our practice. Safely store a copy of your signed Agreement. Violations to the Agreement will result in no further prescriptions. (Additional copies of our Medication Agreement are available upon request.) Laws, Rules, & Regulations: All patients are expected to follow all Federal and [employer], [employer], Rules, [facility]. Ignorance of the Laws does not constitute a valid excuse.  Illegal drugs and Controlled Substances: The use of illegal substances (including, but not limited to marijuana and its derivatives) and/or the illegal use of any controlled substances is strictly prohibited. Violation of this rule may result in the immediate and permanent discontinuation of any and all prescriptions being written by our practice. The use of any illegal substances is prohibited. Adopted CDC guidelines & recommendations: Target dosing levels will be at or below 60 MME/day. Use of benzodiazepines** is not recommended.  Exceptions: There are only two exceptions to the rule of not receiving pain medications from other Healthcare Providers. Exception #1 (Emergencies): In the event of an emergency (i.e.: accident requiring emergency care), you are allowed to receive additional pain medication. However, you are responsible for: As soon as you are able, call our office [phone], at any time of the day or night, and leave a message stating your name, the date and nature of the emergency, and the name and dose of the medication prescribed. In the event that your call is answered by a member of our staff, make sure to document and save the date, time, and the name of the person that took your information.  Exception #2 (Planned Surgery): In the event that you are scheduled by another doctor or dentist to have any type of surgery or procedure, you are allowed (for a period no longer  than 30 days), to receive additional pain medication, for the acute post-op pain. However, in this case, you are responsible for picking up a copy of  our "Post-op Pain Management for Surgeons" handout, and giving it to your surgeon or dentist. This document is available at our office, and does not require an appointment to obtain it. Simply go to our office during business hours (Monday-Thursday from 8:00 AM to 4:00 PM) (Friday 8:00 AM to 12:00 Noon) or if you have a scheduled appointment with [place], prior to your surgery, and ask for it by name. In addition, you are responsible for: calling our office [phone], at any time of the day or night, and leaving a message stating your name, name of your surgeon, type of surgery, and date of procedure or surgery. Failure to comply with your responsibilities may result in termination of therapy involving the controlled substances. Medication Agreement Violation. Following the above rules, including your responsibilities will help you in avoiding a Medication Agreement Violation ("Breaking your Pain Medication Contract").  Consequences:  Not following the above rules may result in permanent discontinuation of medication prescription therapy.  *Opioid medications include: morphine, codeine, oxycodone, oxymorphone, hydrocodone, hydromorphone, meperidine, tramadol, tapentadol, buprenorphine, fentanyl, methadone. **Benzodiazepine medications include: diazepam (Valium), alprazolam (Xanax), clonazepam (Klonopine), lorazepam (Ativan), clorazepate (Tranxene), chlordiazepoxide (Librium), estazolam (Prosom), oxazepam (Serax), temazepam (Restoril), triazolam (Halcion) (Last updated: 11/24/2021) ______________________________________________________________________    ______________________________________________________________________  Medication Recommendations and Reminders  Applies to: All patients receiving prescriptions (written and/or  electronic).  Medication Rules & Regulations: You are responsible for reading, knowing, and following our "Medication Rules" document. These exist for your safety and that of others. They are not flexible and neither are we. Dismissing or ignoring them is an act of "non-compliance" that may result in complete and irreversible termination of such medication therapy. For safety reasons, "non-compliance" will not be tolerated. As with the [place] fundamental legal principle of "ignorance of the law is no defense", we will accept no excuses for not having read and knowing the content of documents provided to you by our practice.  Pharmacy of record:  Definition: This is the pharmacy where your electronic prescriptions will be sent.  We do not endorse any particular pharmacy. It is up to you and your insurance to decide what pharmacy to use.  We do not restrict you in your choice of pharmacy. However, once we write for your prescriptions, we will NOT be re-sending more prescriptions to fix restricted supply problems created by your pharmacy, or your insurance.  The pharmacy listed in the electronic medical record should be the one where you want electronic prescriptions to be sent. If you choose to change pharmacy, simply notify our nursing staff. Changes will be made only during your regular appointments and not over the phone.  Recommendations: Keep all of your pain medications in a safe place, under lock and key, even if you live alone. We will NOT replace lost, stolen, or damaged medication. We do not accept "Police Reports" as proof of medications having been stolen. After you fill your prescription, take 1 week's worth of pills and put them away in a safe place. You should keep a separate, properly labeled bottle for this purpose. The remainder should be kept in the original bottle. Use this as your primary supply, until it runs out. Once it's gone, then you know that you have 1 week's worth of medicine,  and it is time to come in for a prescription refill. If you do this correctly, it is unlikely that you will ever run out of medicine. To make sure that the above recommendation works, it is very important that you make  sure your medication refill appointments are scheduled at least 1 week before you run out of medicine. To do this in an effective manner, make sure that you do not leave the office without scheduling your next medication management appointment. Always ask the nursing staff to show you in your prescription , when your medication will be running out. Then arrange for the receptionist to get you a return appointment, at least 7 days before you run out of medicine. Do not wait until you have 1 or 2 pills left, to come in. This is very poor planning and does not take into consideration that we may need to cancel appointments due to bad weather, sickness, or emergencies affecting our staff. DO NOT ACCEPT A "Partial Fill": If for any reason your pharmacy does not have enough pills/tablets to completely fill or refill your prescription, do not allow for a "partial fill". The law allows the pharmacy to complete that prescription within 72 hours, without requiring a new prescription. If they do not fill the rest of your prescription within those 72 hours, you will need a separate prescription to fill the remaining amount, which we will NOT provide. If the reason for the partial fill is your insurance, you will need to talk to the pharmacist about payment alternatives for the remaining tablets, but again, DO NOT ACCEPT A PARTIAL FILL, unless you can trust your pharmacist to obtain the remainder of the pills within 72 hours.  Prescription refills and/or changes in medication(s):  Prescription refills, and/or changes in dose or medication, will be conducted only during scheduled medication management appointments. (Applies to both, written and electronic prescriptions.) No refills on procedure days. No  medication will be changed or started on procedure days. No changes, adjustments, and/or refills will be conducted on a procedure day. Doing so will interfere with the diagnostic portion of the procedure. No phone refills. No medications will be "called into the pharmacy". No Fax refills. No weekend refills. No Holliday refills. No after hours refills.  Remember:  Business hours are:  Monday to Thursday 8:00 AM to 4:00 PM Provider's Schedule: Milinda Pointer, MD - Appointments are:  Medication management: Monday and Wednesday 8:00 AM to 4:00 PM Procedure day: Tuesday and Thursday 7:30 AM to 4:00 PM Gillis Santa, MD - Appointments are:  Medication management: Tuesday and Thursday 8:00 AM to 4:00 PM Procedure day: Monday and Wednesday 7:30 AM to 4:00 PM (Last update: 11/24/2021) ______________________________________________________________________    ____________________________________________________________________________________________  Drug Holidays  What is a "Drug Holiday"? Drug Holiday: is the name given to the process of slowly tapering down and temporarily stopping the pain medication for the purpose of decreasing or eliminating tolerance to the drug.  Benefits Improved effectiveness Decreased required effective dose Improved pain control End dependence on high dose therapy Decrease cost of therapy Uncovering "opioid-induced hyperalgesia". (OIH)  What is "opioid hyperalgesia"? It is a paradoxical increase in pain caused by exposure to opioids. Stopping the opioid pain medication, contrary to the expected, it actually decreases or completely eliminates the pain. Ref.: "A comprehensive review of opioid-induced hyperalgesia". Brion Aliment, et.al. Pain Physician. 2011 Mar-Apr;14(2):145-61.  What is tolerance? Tolerance: the progressive loss of effectiveness of a pain medicine due to repetitive use. A common problem of opioid pain medications.  How long should a "Drug  Holiday" last? Effectiveness depends on the patient staying off all opioid pain medicines for a minimum of 14 consecutive days. (2 weeks)  How about just taking less of the medicine? Does not  work. Will not accomplish goal of eliminating the excess receptors.  How about switching to a different pain medicine? (AKA. "Opioid rotation") Does not work. Creates the illusion of effectiveness by taking advantage of inaccurate equivalent dose calculations between different opioids. -This "technique" was promoted by studies funded by [employer], such as [employer], creators of "OxyContin".  Can I stop the medicine "cold [place]"? Depends. You should always coordinate with your Pain Specialist to make the transition as smoothly as possible. Avoid stopping the medicine abruptly without consulting. We recommend a "slow taper".  What is a slow taper? Taper: refers to the gradual decrease in dose.   How do I stop/taper the dose? Slowly. Decrease the daily amount of pills that you take by one (1) pill every seven (7) days. This is called a "slow downward taper". Example: if you normally take four (4) pills per day, drop it to three (3) pills per day for seven (7) days, then to two (2) pills per day for seven (7) days, then to one (1) per day for seven (7) days, and then stop the medicine. The 14 day "Drug Holiday" starts on the first day without medicine.   Will I experience withdrawals? Unlikely with a slow taper.  What triggers withdrawals? Withdrawals are triggered by the sudden/abrupt stop of high dose opioids. Withdrawals can be avoided by slowly decreasing the dose over a prolonged period of time.  What are withdrawals? Symptoms associated with sudden/abrupt reduction/stopping of high-dose, long-term use of pain medication. Withdrawal are seldom seen on low dose therapy, or patients rarely taking opioid medication.  Early Withdrawal Symptoms may include: Agitation Anxiety Muscle  aches Increased tearing Insomnia Runny nose Sweating Yawning  Late symptoms may include: Abdominal cramping Diarrhea Dilated pupils Goose bumps Nausea Vomiting  (Last update: 01/10/2022) ____________________________________________________________________________________________    ____________________________________________________________________________________________  WARNING: CBD (cannabidiol) & Delta (Delta-8 tetrahydrocannabinol) products.   Applicable to:  All individuals currently taking or considering taking CBD (cannabidiol) and, more important, all patients taking opioid analgesic controlled substances (pain medication). (Example: oxycodone; oxymorphone; hydrocodone; hydromorphone; morphine; methadone; tramadol; tapentadol; fentanyl; buprenorphine; butorphanol; dextromethorphan; meperidine; codeine; etc.)  Introduction:  Recently there has been a drive towards the use of "natural" products for the treatment of different conditions, including pain anxiety and sleep disorders. Marijuana and hemp are two varieties of the cannabis genus plants. Marijuana and its derivatives are illegal, while hemp and its derivatives are not. Cannabidiol (CBD) and tetrahydrocannabinol (THC), are two natural compounds found in plants of the Cannabis genus. They can both be extracted from hemp or marijuana. Both compounds interact with your body's endocannabinoid system in very different ways. CBD is associated with pain relief (analgesia) while THC is associated with the psychoactive effects ("the high") obtained from the use of marijuana products. There are two main types of THC: Delta-9, which comes from the marijuana plant and it is illegal, and Delta-8, which comes from the hemp plant, and it is legal. (Both, Delta-9-THC and Delta-8-THC are psychoactive and give you "the high".)   Legality:  Marijuana and its derivatives: illegal Hemp and its derivatives: Legal (State dependent) UPDATE:  (03/20/2021) The Drug Enforcement Agency ([place]) issued a letter stating that "delta" cannabinoids, including Delta-8-THCO and Delta-9-THCO, synthetically derived from hemp do not qualify as hemp and will be viewed as Schedule I drugs. (Schedule I drugs, substances, or chemicals are defined as drugs with no currently accepted medical use and a high potential for abuse. Some examples of Schedule I drugs are: heroin,  lysergic acid diethylamide (LSD), marijuana (cannabis), 3,4-methylenedioxymethamphetamine (ecstasy), methaqualone, and peyote.) (https://jennings.com/)  Legal status of CBD in :  "Conditionally Legal"  Reference: "FDA Regulation of Cannabis and Cannabis-Derived Products, Including Cannabidiol (CBD)" - SeekArtists.com.pt  Warning:  CBD is not FDA approved and has not undergo the same manufacturing controls as prescription drugs.  This means that the purity and safety of available CBD may be questionable. Most of the time, despite manufacturer's claims, it is contaminated with THC (delta-9-tetrahydrocannabinol - the chemical in marijuana responsible for the "HIGH").  When this is the case, the [facility] contaminant will trigger a positive urine drug screen (UDS) test for Marijuana (carboxy-THC).   The FDA recently put out a warning about 5 things that everyone should be aware of regarding Delta-8 THC: Delta-8 THC products have not been evaluated or approved by the FDA for safe use and may be marketed in ways that put the public health at risk. The FDA has received adverse event reports involving delta-8 THC-containing products. Delta-8 THC has psychoactive and intoxicating effects. Delta-8 THC manufacturing often involve use of potentially harmful chemicals to create the concentrations of delta-8 THC claimed in the marketplace. The final delta-8 THC product may have potentially harmful  by-products (contaminants) due to the chemicals used in the process. Manufacturing of delta-8 THC products may occur in uncontrolled or unsanitary settings, which may lead to the presence of unsafe contaminants or other potentially harmful substances. Delta-8 THC products should be kept out of the reach of children and pets.  NOTE: Because a positive UDS for any illicit substance is a violation of our medication agreement, your opioid analgesics (pain medicine) may be permanently discontinued.  MORE ABOUT CBD  General Information: CBD was discovered in [birth year] and it is a derivative of the cannabis sativa genus plants (Marijuana and Hemp). It is one of the 113 identified substances found in Marijuana. It accounts for up to 40% of the plant's extract. As of 2018, preliminary clinical studies on CBD included research for the treatment of anxiety, movement disorders, and pain. CBD is available and consumed in multiple forms, including inhalation of smoke or vapor, as an aerosol spray, and by mouth. It may be supplied as an oil containing CBD, capsules, dried cannabis, or as a liquid solution. CBD is thought not to be as psychoactive as THC (delta-9-tetrahydrocannabinol - the chemical in marijuana responsible for the "HIGH"). Studies suggest that CBD may interact with different biological target receptors in the body, including cannabinoid and other neurotransmitter receptors. As of 2018 the mechanism of action for its biological effects has not been determined.  Side-effects  Adverse reactions: Dry mouth, diarrhea, decreased appetite, fatigue, drowsiness, malaise, weakness, sleep disturbances, and others.  Drug interactions:  CBD may interact with medications such as blood-thinners. CBD causes drowsiness on its own and it will increase drowsiness caused by other medications, including antihistamines (such as Benadryl), benzodiazepines (Xanax, Ativan, Valium), antipsychotics, antidepressants, opioids, alcohol  and supplements such as kava, melatonin and St. John's Wort.  Other drug interactions: Brivaracetam (Briviact); Caffeine; Carbamazepine (Tegretol); Citalopram (Celexa); Clobazam (Onfi); Eslicarbazepine (Aptiom); Everolimus (Zostress); Lithium; Methadone (Dolophine); Rufinamide (Banzel); Sedative medications (CNS depressants); Sirolimus (Rapamune); Stiripentol (Diacomit); Tacrolimus (Prograf); Tamoxifen ; Soltamox); Topiramate (Topamax); Valproate; Warfarin (Coumadin); Zonisamide. (Last update: 01/11/2022) ____________________________________________________________________________________________   ____________________________________________________________________________________________  Naloxone Nasal Spray  Why am I receiving this medication? [place] STOP ACT requires that all patients taking high dose opioids or at risk of opioids respiratory depression, be prescribed an opioid reversal agent, such as  Naloxone (AKA: Narcan).  What is this medication? NALOXONE (nal OX one) treats opioid overdose, which causes slow or shallow breathing, severe drowsiness, or trouble staying awake. Call emergency services after using this medication. You may need additional treatment. Naloxone works by reversing the effects of opioids. It belongs to a group of medications called opioid blockers.  COMMON BRAND NAME(S): Kloxxado, Narcan  What should I tell my care team before I take this medication? They need to know if you have any of these conditions: Heart disease Substance use disorder An unusual or allergic reaction to naloxone, other medications, foods, dyes, or preservatives Pregnant or trying to get pregnant Breast-feeding  When to use this medication? This medication is to be used for the treatment of respiratory depression (less than 8 breaths per minute) secondary to opioid overdose.   How to use this medication? This medication is for use in the nose. Lay the person on their back.  Support their neck with your hand and allow the head to tilt back before giving the medication. The nasal spray should be given into 1 nostril. After giving the medication, move the person onto their side. Do not remove or test the nasal spray until ready to use. Get emergency medical help right away after giving the first dose of this medication, even if the person wakes up. You should be familiar with how to recognize the signs and symptoms of a narcotic overdose. If more doses are needed, give the additional dose in the other nostril. Talk to your care team about the use of this medication in children. While this medication may be prescribed for children as young as newborns for selected conditions, precautions do apply.  Naloxone Overdosage: If you think you have taken too much of this medicine contact a poison control center or emergency room at once.  NOTE: This medicine is only for you. Do not share this medicine with others.  What if I miss a dose? This does not apply.  What may interact with this medication? This is only used during an emergency. No interactions are expected during emergency use. This list may not describe all possible interactions. Give your health care provider a list of all the medicines, herbs, non-prescription drugs, or dietary supplements you use. Also tell them if you smoke, drink alcohol, or use illegal drugs. Some items may interact with your medicine.  What should I watch for while using this medication? Keep this medication ready for use in the case of an opioid overdose. Make sure that you have the phone number of your care team and local hospital ready. You may need to have additional doses of this medication. Each nasal spray contains a single dose. Some emergencies may require additional doses. After use, bring the treated person to the nearest hospital or call 911. Make sure the treating care team knows that the person has received a dose of this medication.  You will receive additional instructions on what to do during and after use of this medication before an emergency occurs.  What side effects may I notice from receiving this medication? Side effects that you should report to your care team as soon as possible: Allergic reactions--skin rash, itching, hives, swelling of the face, lips, tongue, or throat Side effects that usually do not require medical attention (report these to your care team if they continue or are bothersome): Constipation Dryness or irritation inside the nose Headache Increase in blood pressure Muscle spasms Stuffy nose Toothache This list may not  describe all possible side effects. Call your doctor for medical advice about side effects. You may report side effects to FDA at 1-800-FDA-1088.  Where should I keep my medication? Because this is an emergency medication, you should keep it with you at all times.  Keep out of the reach of children and pets. Store between 20 and 25 degrees C (68 and 77 degrees F). Do not freeze. Throw away any unused medication after the expiration date. Keep in original box until ready to use.  NOTE: This sheet is a summary. It may not cover all possible information. If you have questions about this medicine, talk to your doctor, pharmacist, or health care provider.   2023 Elsevier/Gold Standard (2020-09-26 00:00:00)  ____________________________________________________________________________________________

## 2022-04-07 NOTE — Progress Notes (Signed)
Nursing Pain Medication Assessment:  Safety precautions to be maintained throughout the outpatient stay will include: orient to surroundings, keep bed in low position, maintain call bell within reach at all times, provide assistance with transfer out of bed and ambulation.  Medication Inspection Compliance: Pill count conducted under aseptic conditions, in front of the patient. Neither the pills nor the bottle was removed from the patient's sight at any time. Once count was completed pills were immediately returned to the patient in their original bottle.  Medication: Tramadol (Ultram) Pill/Patch Count:  79 of 120 pills remain Pill/Patch Appearance: Markings consistent with prescribed medication Bottle Appearance: Standard pharmacy container. Clearly labeled. Filled Date: 66 / 20 / 2023 Last Medication intake:  TodaySafety precautions to be maintained throughout the outpatient stay will include: orient to surroundings, keep bed in low position, maintain call bell within reach at all times, provide assistance with transfer out of bed and ambulation.

## 2022-04-12 ENCOUNTER — Telehealth: Payer: Self-pay | Admitting: Family Medicine

## 2022-04-12 DIAGNOSIS — I10 Essential (primary) hypertension: Secondary | ICD-10-CM

## 2022-04-12 MED ORDER — HYDROCHLOROTHIAZIDE 25 MG PO TABS: ORAL_TABLET | ORAL | 1 refills | Status: DC

## 2022-04-12 NOTE — Telephone Encounter (Signed)
[facility] faxed refill request for the following medications: ° °hydrochlorothiazide (HYDRODIURIL) 25 MG tablet ° ° ° °Please advise. °

## 2022-04-16 NOTE — Progress Notes (Unsigned)
Medicare Initial Preventative Physical Exam    Patient: Dylan [name], Male    DOB: Oct 15, 1950, 72 y.o.   MRN: [id number] Visit Date: 04/19/2022  Today's Provider: [name], MD   No chief complaint on file.  Subjective    Medicare Initial Preventative Physical Exam Dylan [name] is a 72 y.o. male who presents today for his Initial Preventative Physical Exam.   HPI  Social History   Socioeconomic History   Marital status: Married    Spouse name: Not on file   Number of children: Not on file   Years of education: Not on file   Highest education level: Not on file  Occupational History   Not on file  Tobacco Use   Smoking status: Never   Smokeless tobacco: Never  Vaping Use   Vaping Use: Never used  Substance and Sexual Activity   Alcohol use: Not Currently    Comment: rarely alcohol   Drug use: Never   Sexual activity: Not on file  Other Topics Concern   Not on file  Social History Narrative   Not on file   Social Determinants of Health   Financial Resource Strain: Not on file  Food Insecurity: Not on file  Transportation Needs: Not on file  Physical Activity: Not on file  Stress: Not on file  Social Connections: Not on file  Intimate Partner Violence: Not on file    Past Medical History:  Diagnosis Date   Genital disorder, male 08/16/2005   Hypertension    Lumbago 01/17/2009   Pain syndrome, chronic    Sciatica      Patient Active Problem List   Diagnosis Date Noted   Chronic nasal congestion 11/10/2021   Adjustment reaction with anxiety 10/08/2021   Adjustment insomnia 10/08/2021   Chronic use of opiate for therapeutic purpose [id number]   Uncomplicated opioid dependence ([place]) 12/19/2019   Elevated C-reactive protein (CRP) 12/19/2018   Vitamin D insufficiency 12/19/2018   Orchialgia (1ry area of Pain) (Left) 12/06/2018   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   Pharmacologic therapy 12/06/2018   Disorder of  skeletal system 12/06/2018   Problems influencing health status 12/06/2018   Long term current use of opiate analgesic 01/20/2015   Long term prescription opiate use 01/20/2015   Opiate use 01/20/2015   Encounter for long-term current use of medication 01/20/2015   Encounter for chronic pain management 01/20/2015   Neuropathic pain 01/20/2015   Neurogenic pain 01/20/2015   Visceral pain 01/20/2015   Chronic groin pain (2ry area of Pain) (Left) 01/20/2015   Chronic pain syndrome 08/26/2014   Neuralgia neuritis, sciatic nerve 08/26/2014   Chronic low back pain 01/17/2009   Disorder of male genital organs 08/16/2005    Past Surgical History:  Procedure Laterality Date   BACK SURGERY  02/01/2009   ruptured disk   CYSTOSCOPY WITH INSERTION OF UROLIFT     CYSTOSCOPY WITH INSERTION OF UROLIFT N/A 09/03/2021   Procedure: CYSTOSCOPY WITH INSERTION OF UROLIFT;  Surgeon: Royston Cowper, MD;  Location: [facility] ORS;  Service: Urology;  Laterality: N/A;    His family history is not on file.   Current Outpatient Medications:    amLODipine (NORVASC) 10 MG tablet, Take 1 tablet (10 mg total) by mouth daily., Disp: 90 tablet, Rfl: 1   atorvastatin (LIPITOR) 40 MG tablet, TAKE ONE TABLET BY MOUTH DAILY, Disp: 90 tablet, Rfl: 1   azelastine (ASTELIN) 0.1 % nasal spray, Place 2 sprays into both nostrils daily.  Use in each nostril as directed, Disp: 30 mL, Rfl: 12   docusate sodium (COLACE) 100 MG capsule, Take 2 capsules (200 mg total) by mouth 2 (two) times daily., Disp: 120 capsule, Rfl: 3   hydrochlorothiazide (HYDRODIURIL) 25 MG tablet, TAKE ONE-HALF TABLET (12.5 MG TOTAL) BY MOUTH DAILY, Disp: 135 tablet, Rfl: 1   latanoprost (XALATAN) 0.005 % ophthalmic solution, Place into both eyes daily. , Disp: , Rfl:    tamsulosin (FLOMAX) 0.4 MG CAPS capsule, Take 1 capsule (0.4 mg total) by mouth daily. (Patient not taking: Reported on 04/07/2022), Disp: 30 capsule, Rfl: 11   traMADol (ULTRAM) 50 MG tablet,  Take 1 tablet (50 mg total) by mouth every 6 (six) hours as needed for severe pain. Each refill must last 30 days., Disp: 120 tablet, Rfl: 5   Patient Care Team: [name], MD as PCP - General (Family Medicine) Milinda Pointer, MD as Consulting Physician (Pain Medicine)  Review of Systems  {Labs  Heme  Chem  Endocrine  Serology  Results Review (optional):[id number]}  Objective    Vitals: There were no vitals taken for this visit. No results found. Physical Exam ***  Activities of Daily Living    10/08/2021    9:57 AM 08/25/2021    8:37 AM  In your present state of health, do you have any difficulty performing the following activities:  Hearing? 0   Vision? 0   Difficulty concentrating or making decisions? 0   Walking or climbing stairs? 0   Dressing or bathing? 0   Doing errands, shopping? 0 0    Fall Risk Assessment    04/07/2022    8:12 AM 11/10/2021    9:26 AM 10/27/2021    8:24 AM 10/08/2021    9:55 AM 10/07/2021    8:18 AM  Fall Risk   Falls in the past year? 0 0 0 0 0  Number falls in past yr:  0 0 0   Injury with Fall?  0 0 0   Risk for fall due to :  No Fall Risks No Fall Risks No Fall Risks      Depression Screen    10/27/2021    8:25 AM 10/08/2021    9:56 AM 06/09/2021   10:02 AM 04/08/2021    8:00 AM  PHQ 2/9 Scores  PHQ - 2 Score 0 0 0 0  PHQ- 9 Score 5 3 1          No data to display          No results found for any visits on 04/19/22.  Assessment & Plan      Initial Preventative Physical Exam  Reviewed patient's Family Medical History Reviewed and updated list of patient's medical providers Assessment of cognitive impairment was done Assessed patient's functional ability Established a written schedule for health screening [place] Completed and Reviewed  Exercise Activities and Dietary recommendations  Goals   None     Immunization History  Administered Date(s) Administered   Fluad Quad(high  Dose 65+) 11/10/2018, 12/20/2019, 11/26/2020, 10/27/2021   Influenza, High Dose Seasonal PF 01/06/2018   Influenza-Unspecified 01/16/2015, 01/04/2017   PFIZER(Purple Top)SARS-COV-2 Vaccination 11/05/2019   Pneumococcal Conjugate-13 01/04/2017   Pneumococcal Polysaccharide-23 02/06/2019   Tdap 08/16/2005    Health Maintenance  Topic Date Due   Zoster Vaccines- Shingrix (1 of 2) Never done   DTaP/Tdap/Td (2 - Td or Tdap) 08/17/2015   COVID-19 Vaccine (2 - Pfizer risk series) 11/26/2019   COLONOSCOPY (  Pts 45-71yrs Insurance coverage will need to be confirmed)  01/20/2020   Medicare Annual Wellness (AWV)  06/10/2022   Pneumonia Vaccine 30+ Years old  Completed   INFLUENZA VACCINE  Completed   Hepatitis C Screening  Completed   HPV VACCINES  Aged Out     Discussed health benefits of physical activity, and encouraged him to engage in regular exercise appropriate for his age and condition.   ***  No follow-ups on file.     {provider attestation***:1}   [name], MD  [facility] [phone] (phone) [phone] (fax)  [place]

## 2022-04-19 ENCOUNTER — Encounter: Payer: Self-pay | Admitting: Family Medicine

## 2022-04-19 ENCOUNTER — Ambulatory Visit (INDEPENDENT_AMBULATORY_CARE_PROVIDER_SITE_OTHER): Payer: Medicare Other | Admitting: Family Medicine

## 2022-04-19 VITALS — BP 142/78 | HR 80 | Resp 16 | Ht 70.0 in | Wt 194.4 lb

## 2022-04-19 DIAGNOSIS — F5102 Adjustment insomnia: Secondary | ICD-10-CM | POA: Diagnosis not present

## 2022-04-19 DIAGNOSIS — Z0000 Encounter for general adult medical examination without abnormal findings: Secondary | ICD-10-CM

## 2022-04-19 DIAGNOSIS — I10 Essential (primary) hypertension: Secondary | ICD-10-CM | POA: Diagnosis not present

## 2022-04-19 MED ORDER — TRAZODONE HCL 50 MG PO TABS: 25.0000 mg | ORAL_TABLET | Freq: Every evening | ORAL | 0 refills | Status: DC | PRN

## 2022-04-19 MED ORDER — HYDROCHLOROTHIAZIDE 25 MG PO TABS: ORAL_TABLET | ORAL | 1 refills | Status: DC

## 2022-04-19 NOTE — Patient Instructions (Signed)
Please increase hydrochlorothiazide to 25mg  daily and monitor your blood pressure.   Goal is for your blood pressure to be less than 140/90.   Please return for labs and follow up for blood pressure in 2 weeks

## 2022-04-19 NOTE — Assessment & Plan Note (Signed)
Chronic  Reports 4-6 hours of sleep most nights  Will start trazodone 50mg  qHS PRN

## 2022-04-19 NOTE — Assessment & Plan Note (Signed)
Chronic  Reports elevated BP measurements at previous visit with pain management and at dentist  Will increase HCTZ to 25 mg from 12.5mg  daily  Continue amlodipine 10mg 

## 2022-05-11 DIAGNOSIS — H52223 Regular astigmatism, bilateral: Secondary | ICD-10-CM | POA: Diagnosis not present

## 2022-05-12 DIAGNOSIS — H6123 Impacted cerumen, bilateral: Secondary | ICD-10-CM | POA: Diagnosis not present

## 2022-05-12 DIAGNOSIS — H902 Conductive hearing loss, unspecified: Secondary | ICD-10-CM | POA: Diagnosis not present

## 2022-05-18 DIAGNOSIS — H401332 Pigmentary glaucoma, bilateral, moderate stage: Secondary | ICD-10-CM | POA: Diagnosis not present

## 2022-06-04 ENCOUNTER — Other Ambulatory Visit: Payer: Self-pay | Admitting: Family Medicine

## 2022-06-04 DIAGNOSIS — I10 Essential (primary) hypertension: Secondary | ICD-10-CM

## 2022-06-04 MED ORDER — HYDROCHLOROTHIAZIDE 25 MG PO TABS: ORAL_TABLET | ORAL | 1 refills | Status: DC

## 2022-06-11 NOTE — Progress Notes (Unsigned)
I,Dylan [name],acting as a scribe for [employer], MD.,have documented all relevant documentation on the behalf of Dylan Ramp, MD,as directed by  Dylan Ramp, MD while in the presence of Dylan Ramp, MD.    Annual Wellness Visit     Patient: Dylan [name], Male    DOB: 1950-05-04, 72 y.o.   MRN: [id number] Visit Date: 06/14/2022  Today's Provider: [name], MD   Chief Complaint  Patient presents with   Annual Exam   Subjective    Dylan [name] is a 72 y.o. male who presents today for his Annual Wellness Visit. He reports consuming a general diet. Gym/ health club routine includes cardio and walking on track . He generally feels well. He reports sleeping well. He does not have additional problems to discuss today.   Vaccines -recommended COVID, Shingles and Tetanus vaccines   HPI   Medications: Outpatient Medications Prior to Visit  Medication Sig   amLODipine (NORVASC) 10 MG tablet Take 1 tablet (10 mg total) by mouth daily.   atorvastatin (LIPITOR) 40 MG tablet TAKE ONE TABLET BY MOUTH DAILY   hydrochlorothiazide (HYDRODIURIL) 25 MG tablet TAKE ONE TABLET (25 MG TOTAL) BY MOUTH DAILY   latanoprost (XALATAN) 0.005 % ophthalmic solution Place into both eyes daily.    tamsulosin (FLOMAX) 0.4 MG CAPS capsule Take 1 capsule (0.4 mg total) by mouth daily.   traMADol (ULTRAM) 50 MG tablet Take 1 tablet (50 mg total) by mouth every 6 (six) hours as needed for severe pain. Each refill must last 30 days.   traZODone (DESYREL) 50 MG tablet Take 0.5-1 tablets (25-50 mg total) by mouth at bedtime as needed for sleep.   No facility-administered medications prior to visit.    No Known Allergies  Patient Care Team: Dylan Ramp, MD as PCP - General (Family Medicine) Delano Metz, MD as Consulting Physician (Pain Medicine)  Review of Systems  HENT:  Positive for tinnitus.    Genitourinary:  Positive for testicular pain.  All other systems reviewed and are negative.       Objective    Vitals: BP 123/63 (BP Location: Left Arm, Patient Position: Sitting, Cuff Size: Normal)   Pulse 67   Temp 98.4 F (36.9 C) (Oral)   Resp 16   Ht 5\' 10"  (1.778 m)   Wt 194 lb (88 kg)   BMI 27.84 kg/m     Physical Exam Vitals reviewed.  Constitutional:      General: He is not in acute distress.    Appearance: Normal appearance. He is not ill-appearing, toxic-appearing or diaphoretic.  HENT:     Head: Normocephalic and atraumatic.     Right Ear: Tympanic membrane and external ear normal.     Left Ear: Tympanic membrane and external ear normal.     Nose: Nose normal.     Mouth/Throat:     Mouth: Mucous membranes are moist.     Pharynx: No oropharyngeal exudate or posterior oropharyngeal erythema.  Eyes:     General: No scleral icterus.    Extraocular Movements: Extraocular movements intact.     Conjunctiva/sclera: Conjunctivae normal.     Pupils: Pupils are equal, round, and reactive to light.  Neck:     Vascular: No carotid bruit.  Cardiovascular:     Rate and Rhythm: Normal rate and regular rhythm.     Pulses: Normal pulses.     Heart sounds: Normal heart sounds. No murmur heard.    No friction rub. No gallop.  Pulmonary:     Effort: Pulmonary effort is normal. No respiratory distress.     Breath sounds: Normal breath sounds. No stridor. No wheezing, rhonchi or rales.  Abdominal:     General: Bowel sounds are normal. There is no distension.     Palpations: Abdomen is soft.     Tenderness: There is no abdominal tenderness.  Musculoskeletal:        General: No swelling, tenderness or signs of injury. Normal range of motion.     Cervical back: Normal range of motion and neck supple. No rigidity or tenderness.     Right lower leg: Edema present.     Left lower leg: Edema present.  Lymphadenopathy:     Cervical: No cervical adenopathy.  Skin:     General: Skin is warm and dry.     Capillary Refill: Capillary refill takes less than 2 seconds.     Findings: No erythema or rash.  Neurological:     General: No focal deficit present.     Mental Status: He is alert and oriented to person, place, and time.     Cranial Nerves: Cranial nerves 2-12 are intact.     Sensory: Sensation is intact.     Motor: Motor function is intact. No weakness, tremor or abnormal muscle tone.     Gait: Gait normal.  Psychiatric:        Attention and Perception: Attention normal.        Mood and Affect: Mood normal.        Speech: Speech normal.        Behavior: Behavior normal. Behavior is cooperative.        Thought Content: Thought content normal.      Most recent functional status assessment:    06/14/2022    9:04 AM  In your present state of health, do you have any difficulty performing the following activities:  Hearing? 0  Vision? 0  Difficulty concentrating or making decisions? 0  Walking or climbing stairs? 0  Dressing or bathing? 0  Doing errands, shopping? 0   Most recent fall risk assessment:    06/14/2022    9:04 AM  Fall Risk   Falls in the past year? 0  Number falls in past yr: 0  Injury with Fall? 0  Risk for fall due to : No Fall Risks    Most recent depression screenings:    06/14/2022    9:04 AM 04/19/2022    3:02 PM  PHQ 2/9 Scores  PHQ - 2 Score 0 0  PHQ- 9 Score 0 4   Most recent cognitive screening:    06/14/2022    9:06 AM  6CIT Screen  What Year? 0 points  What month? 0 points  What time? 0 points  Count back from 20 0 points  Months in reverse 0 points  Repeat phrase 4 points  Total Score 4 points   Most recent Audit-C alcohol use screening    06/14/2022    9:04 AM  Alcohol Use Disorder Test (AUDIT)  1. How often do you have a drink containing alcohol? 0  2. How many drinks containing alcohol do you have on a typical day when you are drinking? 0  3. How often do you have six or more drinks on one  occasion? 0  AUDIT-C Score 0   A score of 3 or more in women, and 4 or more in men indicates increased risk for alcohol abuse, EXCEPT if all of  the points are from question 1   No results found for any visits on 06/14/22.  Assessment & Plan      Immunization History  Administered Date(s) Administered   Fluad Quad(high Dose 65+) 11/10/2018, 12/20/2019, 11/26/2020, 10/27/2021   Influenza, High Dose Seasonal PF 01/06/2018   Influenza-Unspecified 01/16/2015, 01/04/2017   PFIZER(Purple Top)SARS-COV-2 Vaccination 11/05/2019   Pneumococcal Conjugate-13 01/04/2017   Pneumococcal Polysaccharide-23 02/06/2019   Tdap 08/16/2005    Health Maintenance  Topic Date Due   Zoster Vaccines- Shingrix (1 of 2) Never done   DTaP/Tdap/Td (2 - Td or Tdap) 08/17/2015   COVID-19 Vaccine (2 - Pfizer risk series) 11/26/2019   COLONOSCOPY (Pts 45-10yrs Insurance coverage will need to be confirmed)  06/14/2023 (Originally 01/20/2020)   INFLUENZA VACCINE  09/02/2022   Medicare Annual Wellness (AWV)  06/14/2023   Pneumonia Vaccine 27+ Years old  Completed   Hepatitis C Screening  Completed   HPV VACCINES  Aged Out     Problem List Items Addressed This Visit       Cardiovascular and Mediastinum   Essential hypertension    Controlled BP at goal Continue current medications at current doses No medications changes today        Relevant Orders   Comprehensive metabolic panel     Other   Annual physical exam   Encounter for annual wellness visit (AWV) in Medicare patient - Primary    Annual wellness visit completed today including all of the following: -Reviewed patient's family medical history -Reviewed and updated patient's list of medical providers -Completed assessment of cognitive impairment -Completed assessment of patient's functional ability -Provided patient with recommendations for health screening services as well as vaccines Health risk assessment completed and reviewed -Discussed  recommendations for well-balanced diet in addition to 150 minutes of physical activity per week       Overweight (BMI 25.0-29.9)   Relevant Orders   Lipid panel   Hemoglobin A1c     Return in about 6 months (around 12/15/2022) for HTN .      The entirety of the information documented in the History of Present Illness, Review of Systems and Physical Exam were personally obtained by me. Portions of this information were initially documented by Hetty Ely, CMA . I, Dylan Ramp, MD have reviewed the documentation above for thoroughness and accuracy.      Dylan Ramp, MD  [facility] [phone] (phone) [phone] (fax)  [facility]

## 2022-06-14 ENCOUNTER — Ambulatory Visit (INDEPENDENT_AMBULATORY_CARE_PROVIDER_SITE_OTHER): Payer: Medicare Other | Admitting: Family Medicine

## 2022-06-14 ENCOUNTER — Encounter: Payer: Self-pay | Admitting: Family Medicine

## 2022-06-14 VITALS — BP 123/63 | HR 67 | Temp 98.4°F | Resp 16 | Ht 70.0 in | Wt 194.0 lb

## 2022-06-14 DIAGNOSIS — Z0000 Encounter for general adult medical examination without abnormal findings: Secondary | ICD-10-CM | POA: Insufficient documentation

## 2022-06-14 DIAGNOSIS — E663 Overweight: Secondary | ICD-10-CM | POA: Diagnosis not present

## 2022-06-14 DIAGNOSIS — I10 Essential (primary) hypertension: Secondary | ICD-10-CM | POA: Diagnosis not present

## 2022-06-14 DIAGNOSIS — Z6827 Body mass index (BMI) 27.0-27.9, adult: Secondary | ICD-10-CM | POA: Insufficient documentation

## 2022-06-14 NOTE — Patient Instructions (Signed)
Health Maintenance After Age 72 After age 72, you are at a higher risk for certain long-term diseases and infections as well as injuries from falls. Falls are a major cause of broken bones and head injuries in people who are older than age 72. Getting regular preventive care can help to keep you healthy and well. Preventive care includes getting regular testing and making lifestyle changes as recommended by your health care provider. Talk with your health care provider about: Which screenings and tests you should have. A screening is a test that checks for a disease when you have no symptoms. A diet and exercise plan that is right for you. What should I know about screenings and tests to prevent falls? Screening and testing are the best ways to find a health problem early. Early diagnosis and treatment give you the best chance of managing medical conditions that are common after age 72. Certain conditions and lifestyle choices may make you more likely to have a fall. Your health care provider may recommend: Regular vision checks. Poor vision and conditions such as cataracts can make you more likely to have a fall. If you wear glasses, make sure to get your prescription updated if your vision changes. Medicine review. Work with your health care provider to regularly review all of the medicines you are taking, including over-the-counter medicines. Ask your health care provider about any side effects that may make you more likely to have a fall. Tell your health care provider if any medicines that you take make you feel dizzy or sleepy. Strength and balance checks. Your health care provider may recommend certain tests to check your strength and balance while standing, walking, or changing positions. Foot health exam. Foot pain and numbness, as well as not wearing proper footwear, can make you more likely to have a fall. Screenings, including: Osteoporosis screening. Osteoporosis is a condition that causes  the bones to get weaker and break more easily. Blood pressure screening. Blood pressure changes and medicines to control blood pressure can make you feel dizzy. Depression screening. You may be more likely to have a fall if you have a fear of falling, feel depressed, or feel unable to do activities that you used to do. Alcohol use screening. Using too much alcohol can affect your balance and may make you more likely to have a fall. Follow these instructions at home: Lifestyle Do not drink alcohol if: Your health care provider tells you not to drink. If you drink alcohol: Limit how much you have to: 0-1 drink a day for women. 0-2 drinks a day for men. Know how much alcohol is in your drink. In the [place] one drink equals one 12 oz bottle of beer (355 mL), one 5 oz glass of wine (148 mL), or one 1 oz glass of hard liquor (44 mL). Do not use any products that contain nicotine or tobacco. These products include cigarettes, chewing tobacco, and vaping devices, such as e-cigarettes. If you need help quitting, ask your health care provider. Activity  Follow a regular exercise program to stay fit. This will help you maintain your balance. Ask your health care provider what types of exercise are appropriate for you. If you need a cane or walker, use it as recommended by your health care provider. Wear supportive shoes that have nonskid soles. Safety  Remove any tripping hazards, such as rugs, cords, and clutter. Install safety equipment such as grab bars in bathrooms and safety rails on stairs. Keep rooms and walkways   well-lit. General instructions Talk with your health care provider about your risks for falling. Tell your health care provider if: You fall. Be sure to tell your health care provider about all falls, even ones that seem minor. You feel dizzy, tiredness (fatigue), or off-balance. Take over-the-counter and prescription medicines only as told by your health care provider. These include  supplements. Eat a healthy diet and maintain a healthy weight. A healthy diet includes low-fat dairy products, low-fat (lean) meats, and fiber from whole grains, beans, and lots of fruits and vegetables. Stay current with your vaccines. Schedule regular health, dental, and eye exams. Summary Having a healthy lifestyle and getting preventive care can help to protect your health and wellness after age 72. Screening and testing are the best way to find a health problem early and help you avoid having a fall. Early diagnosis and treatment give you the best chance for managing medical conditions that are more common for people who are older than age 72. Falls are a major cause of broken bones and head injuries in people who are older than age 72. Take precautions to prevent a fall at home. Work with your health care provider to learn what changes you can make to improve your health and wellness and to prevent falls. This information is not intended to replace advice given to you by your health care provider. Make sure you discuss any questions you have with your health care provider. Document Revised: 06/09/2020 Document Reviewed: 06/09/2020 Elsevier Patient Education  2023 Elsevier Inc.  

## 2022-06-14 NOTE — Assessment & Plan Note (Signed)
Annual wellness visit completed today including all of the following: -Reviewed patient's family medical history -Reviewed and updated patient's list of medical providers -Completed assessment of cognitive impairment -Completed assessment of patient's functional ability -Provided patient with recommendations for health screening services as well as vaccines Health risk assessment completed and reviewed -Discussed recommendations for well-balanced diet in addition to 150 minutes of physical activity per week  

## 2022-06-14 NOTE — Assessment & Plan Note (Signed)
Controlled BP at goal Continue current medications at current doses No medications changes today   

## 2022-06-15 LAB — HEMOGLOBIN A1C
Est. average glucose Bld gHb Est-mCnc: 123 mg/dL
Hgb A1c MFr Bld: 5.9 % — ABNORMAL HIGH (ref 4.8–5.6)

## 2022-06-15 LAB — COMPREHENSIVE METABOLIC PANEL
ALT: 21 IU/L (ref 0–44)
AST: 29 IU/L (ref 0–40)
Albumin/Globulin Ratio: 2.3 — ABNORMAL HIGH (ref 1.2–2.2)
Albumin: 4.5 g/dL (ref 3.8–4.8)
Alkaline Phosphatase: 90 IU/L (ref 44–121)
BUN/Creatinine Ratio: 17 (ref 10–24)
BUN: 15 mg/dL (ref 8–27)
Bilirubin Total: 0.5 mg/dL (ref 0.0–1.2)
CO2: 25 mmol/L (ref 20–29)
Calcium: 9.7 mg/dL (ref 8.6–10.2)
Chloride: 100 mmol/L (ref 96–106)
Creatinine, Ser: 0.89 mg/dL (ref 0.76–1.27)
Globulin, Total: 2 g/dL (ref 1.5–4.5)
Glucose: 86 mg/dL (ref 70–99)
Potassium: 3.9 mmol/L (ref 3.5–5.2)
Sodium: 139 mmol/L (ref 134–144)
Total Protein: 6.5 g/dL (ref 6.0–8.5)
eGFR: 92 mL/min/{1.73_m2} (ref >59)

## 2022-06-15 LAB — LIPID PANEL
Chol/HDL Ratio: 4.1 ratio (ref 0.0–5.0)
Cholesterol, Total: 162 mg/dL (ref 100–199)
HDL: 40 mg/dL (ref >39)
LDL Chol Calc (NIH): 66 mg/dL (ref 0–99)
Triglycerides: 357 mg/dL — ABNORMAL HIGH (ref 0–149)
VLDL Cholesterol Cal: 56 mg/dL — ABNORMAL HIGH (ref 5–40)

## 2022-07-15 ENCOUNTER — Other Ambulatory Visit: Payer: Self-pay | Admitting: Family Medicine

## 2022-07-15 DIAGNOSIS — I10 Essential (primary) hypertension: Secondary | ICD-10-CM

## 2022-07-22 ENCOUNTER — Other Ambulatory Visit: Payer: Self-pay | Admitting: Family Medicine

## 2022-07-22 MED ORDER — ATORVASTATIN CALCIUM 40 MG PO TABS: 40.0000 mg | ORAL_TABLET | Freq: Every day | ORAL | 1 refills | Status: DC

## 2022-07-22 NOTE — Telephone Encounter (Signed)
Medication Refill - Medication: Atorvastatin 40 mg  Has the patient contacted their pharmacy? Yes.   (Agent: If no, request that the patient contact the pharmacy for the refill. If patient does not wish to contact the pharmacy document the reason why and proceed with request.) (Agent: If yes, when and what did the pharmacy advise?)  Preferred Pharmacy (with phone number or street name): [facility] Has the patient been seen for an appointment in the last year OR does the patient have an upcoming appointment? Yes.    Agent: Please be advised that RX refills may take up to 3 business days. We ask that you follow-up with your pharmacy.

## 2022-07-22 NOTE — Telephone Encounter (Signed)
Requested Prescriptions  Pending Prescriptions Disp Refills   atorvastatin (LIPITOR) 40 MG tablet 90 tablet 1    Sig: Take 1 tablet (40 mg total) by mouth daily.     Cardiovascular:  Antilipid - Statins Failed - 07/22/2022  9:54 AM      Failed - Lipid Panel in normal range within the last 12 months    Cholesterol, Total  Date Value Ref Range Status  06/14/2022 162 100 - 199 mg/dL Final   LDL Chol Calc (NIH)  Date Value Ref Range Status  06/14/2022 66 0 - 99 mg/dL Final   HDL  Date Value Ref Range Status  06/14/2022 40 >39 mg/dL Final   Triglycerides  Date Value Ref Range Status  06/14/2022 357 (H) 0 - 149 mg/dL Final         Passed - Patient is not pregnant      Passed - Valid encounter within last 12 months    Recent Outpatient Visits           1 month ago Encounter for annual wellness visit (AWV) in Medicare patient   [facility] Simmons[place]Robinson, [place], MD   3 months ago Essential hypertension   [facility] Kevin, [place], MD   8 months ago Chronic nasal congestion   [facility] [facility] [place], [employer], MD   8 months ago Adjustment reaction with anxiety   [facility] Simmons-Robinson, [place], MD   9 months ago Adjustment reaction with anxiety   [facility] [place], Tawanna Cooler, MD

## 2022-08-24 DIAGNOSIS — H6123 Impacted cerumen, bilateral: Secondary | ICD-10-CM | POA: Diagnosis not present

## 2022-08-24 DIAGNOSIS — H902 Conductive hearing loss, unspecified: Secondary | ICD-10-CM | POA: Diagnosis not present

## 2022-08-31 DIAGNOSIS — D2262 Melanocytic nevi of left upper limb, including shoulder: Secondary | ICD-10-CM | POA: Diagnosis not present

## 2022-08-31 DIAGNOSIS — C44612 Basal cell carcinoma of skin of right upper limb, including shoulder: Secondary | ICD-10-CM | POA: Diagnosis not present

## 2022-08-31 DIAGNOSIS — D2271 Melanocytic nevi of right lower limb, including hip: Secondary | ICD-10-CM | POA: Diagnosis not present

## 2022-08-31 DIAGNOSIS — D2261 Melanocytic nevi of right upper limb, including shoulder: Secondary | ICD-10-CM | POA: Diagnosis not present

## 2022-08-31 DIAGNOSIS — L570 Actinic keratosis: Secondary | ICD-10-CM | POA: Diagnosis not present

## 2022-08-31 DIAGNOSIS — D485 Neoplasm of uncertain behavior of skin: Secondary | ICD-10-CM | POA: Diagnosis not present

## 2022-08-31 DIAGNOSIS — D225 Melanocytic nevi of trunk: Secondary | ICD-10-CM | POA: Diagnosis not present

## 2022-09-30 NOTE — Patient Instructions (Addendum)
 ____________________________________________________________________________________________  Opioid Pain Medication Update  To: All patients taking opioid pain medications. (I.e.: hydrocodone, hydromorphone, oxycodone, oxymorphone, morphine, codeine, methadone, tapentadol, tramadol, buprenorphine, fentanyl, etc.)  Re: Updated review of side effects and adverse reactions of opioid analgesics, as well as new information about long term effects of this class of medications.  Direct risks of long-term opioid therapy are not limited to opioid addiction and overdose. Potential medical risks include serious fractures, breathing problems during sleep, hyperalgesia, immunosuppression, chronic constipation, bowel obstruction, myocardial infarction, and tooth decay secondary to xerostomia.  Unpredictable adverse effects that can occur even if you take your medication correctly: Cognitive impairment, respiratory depression, and death. Most people think that if they take their medication "correctly", and "as instructed", that they will be safe. Nothing could be farther from the truth. In reality, a significant amount of recorded deaths associated with the use of opioids has occurred in individuals that had taken the medication for a long time, and were taking their medication correctly. The following are examples of how this can happen: Patient taking his/her medication for a long time, as instructed, without any side effects, is given a certain antibiotic or another unrelated medication, which in turn triggers a "Drug-to-drug interaction" leading to disorientation, cognitive impairment, impaired reflexes, respiratory depression or an untoward event leading to serious bodily harm or injury, including death.  Patient taking his/her medication for a long time, as instructed, without any side effects, develops an acute impairment of liver and/or kidney function. This will lead to a rapid inability of the body to  breakdown and eliminate their pain medication, which will result in effects similar to an "overdose", but with the same medicine and dose that they had always taken. This again may lead to disorientation, cognitive impairment, impaired reflexes, respiratory depression or an untoward event leading to serious bodily harm or injury, including death.  A similar problem will occur with patients as they grow older and their liver and kidney function begins to decrease as part of the aging process.  Background information: Historically, the original case for using long-term opioid therapy to treat chronic noncancer pain was based on safety assumptions that subsequent experience has called into question. In 1996, the American Pain Society and the American Academy of Pain Medicine issued a consensus statement supporting long-term opioid therapy. This statement acknowledged the dangers of opioid prescribing but concluded that the risk for addiction was low; respiratory depression induced by opioids was short-lived, occurred mainly in opioid-naive patients, and was antagonized by pain; tolerance was not a common problem; and efforts to control diversion should not constrain opioid prescribing. This has now proven to be wrong. Experience regarding the risks for opioid addiction, misuse, and overdose in community practice has failed to support these assumptions.  According to the Centers for Disease Control and Prevention, fatal overdoses involving opioid analgesics have increased sharply over the past decade. Currently, more than 96,700 people die from drug overdoses every year. Opioids are a factor in 7 out of every 10 overdose deaths. Deaths from drug overdose have surpassed motor vehicle accidents as the leading cause of death for individuals between the ages of 37 and 92.  Clinical data suggest that neuroendocrine dysfunction may be very common in both men and women, potentially causing hypogonadism, erectile  dysfunction, infertility, decreased libido, osteoporosis, and depression. Recent studies linked higher opioid dose to increased opioid-related mortality. Controlled observational studies reported that long-term opioid therapy may be associated with increased risk for cardiovascular events. Subsequent meta-analysis concluded  that the safety of long-term opioid therapy in elderly patients has not been proven.   Side Effects and adverse reactions: Common side effects: Drowsiness (sedation). Dizziness. Nausea and vomiting. Constipation. Physical dependence -- Dependence often manifests with withdrawal symptoms when opioids are discontinued or decreased. Tolerance -- As you take repeated doses of opioids, you require increased medication to experience the same effect of pain relief. Respiratory depression -- This can occur in healthy people, especially with higher doses. However, people with COPD, asthma or other lung conditions may be even more susceptible to fatal respiratory impairment.  Uncommon side effects: An increased sensitivity to feeling pain and extreme response to pain (hyperalgesia). Chronic use of opioids can lead to this. Delayed gastric emptying (the process by which the contents of your stomach are moved into your small intestine). Muscle rigidity. Immune system and hormonal dysfunction. Quick, involuntary muscle jerks (myoclonus). Arrhythmia. Itchy skin (pruritus). Dry mouth (xerostomia).  Long-term side effects: Chronic constipation. Sleep-disordered breathing (SDB). Increased risk of bone fractures. Hypothalamic-pituitary-adrenal dysregulation. Increased risk of overdose.  RISKS: Respiratory depression and death: Opioids increase the risk of respiratory depression and death.  Drug-to-drug interactions: Opioids are relatively contraindicated in combination with benzodiazepines, sleep inducers, and other central nervous system depressants. Other classes of medications  (i.e.: certain antibiotics and even over-the-counter medications) may also trigger or induce respiratory depression in some patients.  Medical conditions: Patients with pre-existing respiratory problems are at higher risk of respiratory failure and/or depression when in combination with opioid analgesics. Opioids are relatively contraindicated in some medical conditions such as central sleep apnea.   Fractures and Falls:  Opioids increase the risk and incidence of falls. This is of particular importance in elderly patients.  Endocrine System:  Long-term administration is associated with endocrine abnormalities (endocrinopathies). (Also known as Opioid-induced Endocrinopathy) Influences on both the hypothalamic-pituitary-adrenal axis?and the hypothalamic-pituitary-gonadal axis have been demonstrated with consequent hypogonadism and adrenal insufficiency in both sexes. Hypogonadism and decreased levels of dehydroepiandrosterone sulfate have been reported in men and women. Endocrine effects include: Amenorrhoea in women (abnormal absence of menstruation) Reduced libido in both sexes Decreased sexual function Erectile dysfunction in men Hypogonadisms (decreased testicular function with shrinkage of testicles) Infertility Depression and fatigue Loss of muscle mass Anxiety Depression Immune suppression Hyperalgesia Weight gain Anemia Osteoporosis Patients (particularly women of childbearing age) should avoid opioids. There is insufficient evidence to recommend routine monitoring of asymptomatic patients taking opioids in the long-term for hormonal deficiencies.  Immune System: Human studies have demonstrated that opioids have an immunomodulating effect. These effects are mediated via opioid receptors both on immune effector cells and in the central nervous system. Opioids have been demonstrated to have adverse effects on antimicrobial response and anti-tumour surveillance. Buprenorphine has  been demonstrated to have no impact on immune function.  Opioid Induced Hyperalgesia: Human studies have demonstrated that prolonged use of opioids can lead to a state of abnormal pain sensitivity, sometimes called opioid induced hyperalgesia (OIH). Opioid induced hyperalgesia is not usually seen in the absence of tolerance to opioid analgesia. Clinically, hyperalgesia may be diagnosed if the patient on long-term opioid therapy presents with increased pain. This might be qualitatively and anatomically distinct from pain related to disease progression or to breakthrough pain resulting from development of opioid tolerance. Pain associated with hyperalgesia tends to be more diffuse than the pre-existing pain and less defined in quality. Management of opioid induced hyperalgesia requires opioid dose reduction.  Cancer: Chronic opioid therapy has been associated with an increased risk of cancer  among noncancer patients with chronic pain. This association was more evident in chronic strong opioid users. Chronic opioid consumption causes significant pathological changes in the small intestine and colon. Epidemiological studies have found that there is a link between opium dependence and initiation of gastrointestinal cancers. Cancer is the second leading cause of death after cardiovascular disease. Chronic use of opioids can cause multiple conditions such as GERD, immunosuppression and renal damage as well as carcinogenic effects, which are associated with the incidence of cancers.   Mortality: Long-term opioid use has been associated with increased mortality among patients with chronic non-cancer pain (CNCP).  Prescription of long-acting opioids for chronic noncancer pain was associated with a significantly increased risk of all-cause mortality, including deaths from causes other than overdose.  Reference: Von Korff M, Kolodny A, Deyo RA, Chou R. Long-term opioid therapy reconsidered. Ann Intern Med. 2011  Sep 6;155(5):325-8. doi: 10.7326/[id number]. PMID: 23557322; PMCID: [id number]. Randon Goldsmith, Hayward RA, Dunn KM, [place] KP. Risk of adverse events in patients prescribed long-term opioids: A cohort study in the [place] [facility] [employer] D[employer]. Eur J Pain. 2019 May;23(5):908-922. doi: 10.1002/ejp.1357. Epub 2019 Jan 31. PMID: 76283151. Colameco S, Coren JS, Ciervo CA. Continuous opioid treatment for chronic noncancer pain: a time for moderation in prescribing. Postgrad Med. 2009 Jul;121(4):61-6. doi: 10.3810/pgm.2009.07.2032. PMID: 76160737. William Hamburger RN, [place], Blazina I, Cristopher Peru, Bougatsos C, Deyo RA. The effectiveness and risks of long-term opioid therapy for chronic pain: a systematic review for a [employer] of [employer] Pathways to [employer]. Ann Intern Med. 2015 Feb 17;162(4):276-86. doi: 10.7326/M14-2559. PMID: 10626948. Caryl Bis [facility], [place] DM. NCHS Data Brief No. 22. [place]: Centers for Disease Control and Prevention; 2009. Sep, Increase in Fatal Poisonings Involving Opioid Analgesics in the [place], 1999-2006. Song IA, Choi HR, Oh TK. Long-term opioid use and mortality in patients with chronic non-cancer pain: Ten-year follow-up study in [place] from 2010 through 2019. EClinicalMedicine. 2022 Jul 18;51:101558. doi: 10.1016/j.eclinm.2022.546270. PMID: 35009381; PMCID: [id number]. Huser, W., Schubert, T., Vogelmann, T. et al. All-cause mortality in patients with long-term opioid therapy compared with non-opioid analgesics for chronic non-cancer pain: a database study. BMC Med 18, 162 (2020). http://lester.info/ Rashidian H, Karie Kirks, Malekzadeh R, Haghdoost AA. An Ecological Study of the Association between Opiate Use and Incidence of Cancers. Addict Health. 2016 Fall;8(4):252-260. PMID: 78938101; PMCID: [id number].  Our Goal: Our goal is to control your  pain with means other than the use of opioid pain medications.  Our Recommendation: Talk to your physician about coming off of these medications. We can assist you with the tapering down and stopping these medicines. Based on the new information, even if you cannot completely stop the medication, a decrease in the dose may be associated with a lesser risk. Ask for other means of controlling the pain. Decrease or eliminate those factors that significantly contribute to your pain such as smoking, obesity, and a diet heavily tilted towards "inflammatory" nutrients.  Last Updated: 08/09/2022   ____________________________________________________________________________________________     ____________________________________________________________________________________________  National Pain Medication Shortage  The [place] is experiencing worsening drug shortages. These have had a negative widespread effect on patient care and treatment. Not expected to improve any time soon. Predicted to last past 2029.   Drug shortage list (generic names) Oxycodone IR Oxycodone/APAP Oxymorphone IR Hydromorphone Hydrocodone/APAP Morphine  Where is the problem?  Manufacturing and supply level.  Will this shortage affect you?  Only if you  take any of the above pain medications.  How? You may be unable to fill your prescription.  Your pharmacist may offer a "partial fill" of your prescription. (Warning: Do not accept partial fills.) Prescriptions partially filled cannot be transferred to another pharmacy. Read our Medication Rules and Regulation. Depending on how much medicine you are dependent on, you may experience withdrawals when unable to get the medication.  Recommendations: Consider ending your dependence on opioid pain medications. Ask your pain specialist to assist you with the process. Consider switching to a medication currently not in shortage, such as Buprenorphine. Talk to your pain  specialist about this option. Consider decreasing your pain medication requirements by managing tolerance thru "Drug Holidays". This may help minimize withdrawals, should you run out of medicine. Control your pain thru the use of non-pharmacological interventional therapies.   Your prescriber: Prescribers cannot be blamed for shortages. Medication manufacturing and supply issues cannot be fixed by the prescriber.   NOTE: The prescriber is not responsible for supplying the medication, or solving supply issues. Work with your pharmacist to solve it. The patient is responsible for the decision to take or continue taking the medication and for identifying and securing a legal supply source. By law, supplying the medication is the job and responsibility of the pharmacy. The prescriber is responsible for the evaluation, monitoring, and prescribing of these medications.   Prescribers will NOT: Re-issue prescriptions that have been partially filled. Re-issue prescriptions already sent to a pharmacy.  Re-send prescriptions to a different pharmacy because yours did not have your medication. Ask pharmacist to order more medicine or transfer the prescription to another pharmacy. (Read below.)  New 2023 regulation: "October 02, 2021 Revised Regulation Allows DEA-Registered Pharmacies to Transfer Electronic Prescriptions at a Patient's Request DEA Headquarters Division - Public Information Office Patients now have the ability to request their electronic prescription be transferred to another pharmacy without having to go back to their practitioner to initiate the request. This revised regulation went into effect on Monday, September 28, 2021.     At a patient's request, a DEA-registered retail pharmacy can now transfer an electronic prescription for a controlled substance (schedules II-V) to another DEA-registered retail pharmacy. Prior to this change, patients would have to go through their practitioner to  cancel their prescription and have it re-issued to a different pharmacy. The process was taxing and time consuming for both patients and practitioners.    The Drug Enforcement Administration [facility]) published its intent to revise the process for transferring electronic prescriptions on December 21, 2019.  The final rule was published in the federal register on August 27, 2021 and went into effect 30 days later.  Under the final rule, a prescription can only be transferred once between pharmacies, and only if allowed under existing state or other applicable law. The prescription must remain in its electronic form; may not be altered in any way; and the transfer must be communicated directly between two licensed pharmacists. It's important to note, any authorized refills transfer with the original prescription, which means the entire prescription will be filled at the same pharmacy".  Reference: [email or web address] [facility] website announcement)  [email or web address] [employer] of [employer])   [employer] / Vol. 88, No. 143 / Thursday, August 27, 2021 / Rules and Regulations DEPARTMENT OF JUSTICE  Drug Enforcement Administration  21 CFR Part 1306  [Docket No. [id number]]  RIN [id number] Transfer of Electronic Prescriptions for Schedules II-V Controlled Substances Between Pharmacies for Initial Filling  ____________________________________________________________________________________________  ____________________________________________________________________________________________  Transfer of Pain Medication between Pharmacies  Re: 2023 DEA Clarification on existing regulation  Published on DEA Website: October 02, 2021  Title: Revised Regulation Allows DEA-Registered Pharmacies to Electrical engineer Prescriptions at a Patient's  Request DEA Headquarters Division - [employer]  "Patients now have the ability to request their electronic prescription be transferred to another pharmacy without having to go back to their practitioner to initiate the request. This revised regulation went into effect on Monday, September 28, 2021.     At a patient's request, a DEA-registered retail pharmacy can now transfer an electronic prescription for a controlled substance (schedules II-V) to another DEA-registered retail pharmacy. Prior to this change, patients would have to go through their practitioner to cancel their prescription and have it re-issued to a different pharmacy. The process was taxing and time consuming for both patients and practitioners.    The Drug Enforcement Administration [facility]) published its intent to revise the process for transferring electronic prescriptions on December 21, 2019.  The final rule was published in the federal register on August 27, 2021 and went into effect 30 days later.  Under the final rule, a prescription can only be transferred once between pharmacies, and only if allowed under existing state or other applicable law. The prescription must remain in its electronic form; may not be altered in any way; and the transfer must be communicated directly between two licensed pharmacists. It's important to note, any authorized refills transfer with the original prescription, which means the entire prescription will be filled at the same pharmacy."    REFERENCES: 1. DEA website announcement [email or web address]  2. Department of Justice website  [email or web address]  3. DEPARTMENT OF JUSTICE Drug Enforcement Administration 21 CFR Part 1306 [Docket No. [id number]] RIN [id number] "Transfer of Electronic Prescriptions for Schedules II-V Controlled Substances  Between Pharmacies for Initial Filling"  ____________________________________________________________________________________________     _______________________________________________________________________  Medication Rules  Purpose: To inform patients, and their family members, of our medication rules and regulations.  Applies to: All patients receiving prescriptions from our practice (written or electronic).  Pharmacy of record: This is the pharmacy where your electronic prescriptions will be sent. Make sure we have the correct one.  Electronic prescriptions: In compliance with the [place] Strengthen Opioid Misuse Prevention (STOP) Act of 2017 (Session Conni Elliot [phone]), effective February 01, 2018, all controlled substances must be electronically prescribed. Written prescriptions, faxing, or calling prescriptions to a pharmacy will no longer be done.  Prescription refills: These will be provided only during in-person appointments. No medications will be renewed without a "face-to-face" evaluation with your provider. Applies to all prescriptions.  NOTE: The following applies primarily to controlled substances (Opioid* Pain Medications).   Type of encounter (visit): For patients receiving controlled substances, face-to-face visits are required. (Not an option and not up to the patient.)  Patient's responsibilities: Pain Pills: Bring all pain pills to every appointment (except for procedure appointments). Pill Bottles: Bring pills in original pharmacy bottle. Bring bottle, even if empty. Always bring the bottle of the most recent fill.  Medication refills: You are responsible for knowing and keeping track of what medications you are taking and when is it that you will need a refill. The day before your appointment: write a list of all prescriptions that need to be refilled. The day of the appointment: give the list to the admitting nurse. Prescriptions will be written only  during appointments. No prescriptions will be written on procedure days. If you forget a  medication: it will not be "Called in", "Faxed", or "electronically sent". You will need to get another appointment to get these prescribed. No early refills. Do not call asking to have your prescription filled early. Partial  or short prescriptions: Occasionally your pharmacy may not have enough pills to fill your prescription.  NEVER ACCEPT a partial fill or a prescription that is short of the total amount of pills that you were prescribed.  With controlled substances the law allows 72 hours for the pharmacy to complete the prescription.  If the prescription is not completed within 72 hours, the pharmacist will require a new prescription to be written. This means that you will be short on your medicine and we WILL NOT send another prescription to complete your original prescription.  Instead, request the pharmacy to send a carrier to a nearby branch to get enough medication to provide you with your full prescription. Prescription Accuracy: You are responsible for carefully inspecting your prescriptions before leaving our office. Have the discharge nurse carefully go over each prescription with you, before taking them home. Make sure that your name is accurately spelled, that your address is correct. Check the name and dose of your medication to make sure it is accurate. Check the number of pills, and the written instructions to make sure they are clear and accurate. Make sure that you are given enough medication to last until your next medication refill appointment. Taking Medication: Take medication as prescribed. When it comes to controlled substances, taking less pills or less frequently than prescribed is permitted and encouraged. Never take more pills than instructed. Never take the medication more frequently than prescribed.  Inform other Doctors: Always inform, all of your healthcare providers, of all the  medications you take. Pain Medication from other Providers: You are not allowed to accept any additional pain medication from any other Doctor or Healthcare provider. There are two exceptions to this rule. (see below) In the event that you require additional pain medication, you are responsible for notifying us, as stated below. Cough Medicine: Often these contain an opioid, such as codeine or hydrocodone. Never accept or take cough medicine containing these opioids if you are already taking an opioid* medication. The combination may cause respiratory failure and death. Medication Agreement: You are responsible for carefully reading and following our Medication Agreement. This must be signed before receiving any prescriptions from our practice. Safely store a copy of your signed Agreement. Violations to the Agreement will result in no further prescriptions. (Additional copies of our Medication Agreement are available upon request.) Laws, Rules, & Regulations: All patients are expected to follow all [address] and [employer], [employer], Rules, [place] [employer]. Ignorance of the Laws does not constitute a valid excuse.  Illegal drugs and Controlled Substances: The use of illegal substances (including, but not limited to marijuana and its derivatives) and/or the illegal use of any controlled substances is strictly prohibited. Violation of this rule may result in the immediate and permanent discontinuation of any and all prescriptions being written by our practice. The use of any illegal substances is prohibited. Adopted CDC guidelines & recommendations: Target dosing levels will be at or below 60 MME/day. Use of benzodiazepines** is not recommended.  Exceptions: There are only two exceptions to the rule of not receiving pain medications from other Healthcare Providers. Exception #1 (Emergencies): In the event of an emergency (i.e.: accident requiring emergency care), you are allowed to receive additional pain  medication. However, you are responsible for: As soon as  you are able, call our office [phone], at any time of the day or night, and leave a message stating your name, the date and nature of the emergency, and the name and dose of the medication prescribed. In the event that your call is answered by a member of our staff, make sure to document and save the date, time, and the name of the person that took your information.  Exception #2 (Planned Surgery): In the event that you are scheduled by another doctor or dentist to have any type of surgery or procedure, you are allowed (for a period no longer than 30 days), to receive additional pain medication, for the acute post-op pain. However, in this case, you are responsible for picking up a copy of our "Post-op Pain Management for Surgeons" handout, and giving it to your surgeon or dentist. This document is available at our office, and does not require an appointment to obtain it. Simply go to our office during business hours (Monday-Thursday from 8:00 AM to 4:00 PM) (Friday 8:00 AM to 12:00 Noon) or if you have a scheduled appointment with [place], prior to your surgery, and ask for it by name. In addition, you are responsible for: calling our office [phone], at any time of the day or night, and leaving a message stating your name, name of your surgeon, type of surgery, and date of procedure or surgery. Failure to comply with your responsibilities may result in termination of therapy involving the controlled substances. Medication Agreement Violation. Following the above rules, including your responsibilities will help you in avoiding a Medication Agreement Violation ("Breaking your Pain Medication Contract").  Consequences:  Not following the above rules may result in permanent discontinuation of medication prescription therapy.  *Opioid medications include: morphine, codeine, oxycodone, oxymorphone, hydrocodone, hydromorphone, meperidine, tramadol,  tapentadol, buprenorphine, fentanyl, methadone. **Benzodiazepine medications include: diazepam (Valium), alprazolam (Xanax), clonazepam (Klonopine), lorazepam (Ativan), clorazepate (Tranxene), chlordiazepoxide (Librium), estazolam (Prosom), oxazepam (Serax), temazepam (Restoril), triazolam (Halcion) (Last updated: 11/24/2021) ______________________________________________________________________    ______________________________________________________________________  Medication Recommendations and Reminders  Applies to: All patients receiving prescriptions (written and/or electronic).  Medication Rules & Regulations: You are responsible for reading, knowing, and following our "Medication Rules" document. These exist for your safety and that of others. They are not flexible and neither are we. Dismissing or ignoring them is an act of "non-compliance" that may result in complete and irreversible termination of such medication therapy. For safety reasons, "non-compliance" will not be tolerated. As with the [place] fundamental legal principle of "ignorance of the law is no defense", we will accept no excuses for not having read and knowing the content of documents provided to you by our practice.  Pharmacy of record:  Definition: This is the pharmacy where your electronic prescriptions will be sent.  We do not endorse any particular pharmacy. It is up to you and your insurance to decide what pharmacy to use.  We do not restrict you in your choice of pharmacy. However, once we write for your prescriptions, we will NOT be re-sending more prescriptions to fix restricted supply problems created by your pharmacy, or your insurance.  The pharmacy listed in the electronic medical record should be the one where you want electronic prescriptions to be sent. If you choose to change pharmacy, simply notify our nursing staff. Changes will be made only during your regular appointments and not over the  phone.  Recommendations: Keep all of your pain medications in a safe place, under lock and key, even  if you live alone. We will NOT replace lost, stolen, or damaged medication. We do not accept "Police Reports" as proof of medications having been stolen. After you fill your prescription, take 1 week's worth of pills and put them away in a safe place. You should keep a separate, properly labeled bottle for this purpose. The remainder should be kept in the original bottle. Use this as your primary supply, until it runs out. Once it's gone, then you know that you have 1 week's worth of medicine, and it is time to come in for a prescription refill. If you do this correctly, it is unlikely that you will ever run out of medicine. To make sure that the above recommendation works, it is very important that you make sure your medication refill appointments are scheduled at least 1 week before you run out of medicine. To do this in an effective manner, make sure that you do not leave the office without scheduling your next medication management appointment. Always ask the nursing staff to show you in your prescription , when your medication will be running out. Then arrange for the receptionist to get you a return appointment, at least 7 days before you run out of medicine. Do not wait until you have 1 or 2 pills left, to come in. This is very poor planning and does not take into consideration that we may need to cancel appointments due to bad weather, sickness, or emergencies affecting our staff. DO NOT ACCEPT A "Partial Fill": If for any reason your pharmacy does not have enough pills/tablets to completely fill or refill your prescription, do not allow for a "partial fill". The law allows the pharmacy to complete that prescription within 72 hours, without requiring a new prescription. If they do not fill the rest of your prescription within those 72 hours, you will need a separate prescription to fill the remaining  amount, which we will NOT provide. If the reason for the partial fill is your insurance, you will need to talk to the pharmacist about payment alternatives for the remaining tablets, but again, DO NOT ACCEPT A PARTIAL FILL, unless you can trust your pharmacist to obtain the remainder of the pills within 72 hours.  Prescription refills and/or changes in medication(s):  Prescription refills, and/or changes in dose or medication, will be conducted only during scheduled medication management appointments. (Applies to both, written and electronic prescriptions.) No refills on procedure days. No medication will be changed or started on procedure days. No changes, adjustments, and/or refills will be conducted on a procedure day. Doing so will interfere with the diagnostic portion of the procedure. No phone refills. No medications will be "called into the pharmacy". No Fax refills. No weekend refills. No Holliday refills. No after hours refills.  Remember:  Business hours are:  Monday to Thursday 8:00 AM to 4:00 PM Provider's Schedule: Delano Metz, MD - Appointments are:  Medication management: Monday and Wednesday 8:00 AM to 4:00 PM Procedure day: Tuesday and Thursday 7:30 AM to 4:00 PM Edward Jolly, MD - Appointments are:  Medication management: Tuesday and Thursday 8:00 AM to 4:00 PM Procedure day: Monday and Wednesday 7:30 AM to 4:00 PM (Last update: 11/24/2021) ______________________________________________________________________   ____________________________________________________________________________________________  Naloxone Nasal Spray  Why am I receiving this medication? [place] STOP ACT requires that all patients taking high dose opioids or at risk of opioids respiratory depression, be prescribed an opioid reversal agent, such as Naloxone (AKA: Narcan).  What is this medication? NALOXONE (  nal OX one) treats opioid overdose, which causes slow or shallow breathing,  severe drowsiness, or trouble staying awake. Call emergency services after using this medication. You may need additional treatment. Naloxone works by reversing the effects of opioids. It belongs to a group of medications called opioid blockers.  COMMON BRAND NAME(S): Kloxxado, Narcan  What should I tell my care team before I take this medication? They need to know if you have any of these conditions: Heart disease Substance use disorder An unusual or allergic reaction to naloxone, other medications, foods, dyes, or preservatives Pregnant or trying to get pregnant Breast-feeding  When to use this medication? This medication is to be used for the treatment of respiratory depression (less than 8 breaths per minute) secondary to opioid overdose.   How to use this medication? This medication is for use in the nose. Lay the person on their back. Support their neck with your hand and allow the head to tilt back before giving the medication. The nasal spray should be given into 1 nostril. After giving the medication, move the person onto their side. Do not remove or test the nasal spray until ready to use. Get emergency medical help right away after giving the first dose of this medication, even if the person wakes up. You should be familiar with how to recognize the signs and symptoms of a narcotic overdose. If more doses are needed, give the additional dose in the other nostril. Talk to your care team about the use of this medication in children. While this medication may be prescribed for children as young as newborns for selected conditions, precautions do apply.  Naloxone Overdosage: If you think you have taken too much of this medicine contact a poison control center or emergency room at once.  NOTE: This medicine is only for you. Do not share this medicine with others.  What if I miss a dose? This does not apply.  What may interact with this medication? This is only used during an  emergency. No interactions are expected during emergency use. This list may not describe all possible interactions. Give your health care provider a list of all the medicines, herbs, non-prescription drugs, or dietary supplements you use. Also tell them if you smoke, drink alcohol, or use illegal drugs. Some items may interact with your medicine.  What should I watch for while using this medication? Keep this medication ready for use in the case of an opioid overdose. Make sure that you have the phone number of your care team and local hospital ready. You may need to have additional doses of this medication. Each nasal spray contains a single dose. Some emergencies may require additional doses. After use, bring the treated person to the nearest hospital or call 911. Make sure the treating care team knows that the person has received a dose of this medication. You will receive additional instructions on what to do during and after use of this medication before an emergency occurs.  What side effects may I notice from receiving this medication? Side effects that you should report to your care team as soon as possible: Allergic reactions--skin rash, itching, hives, swelling of the face, lips, tongue, or throat Side effects that usually do not require medical attention (report these to your care team if they continue or are bothersome): Constipation Dryness or irritation inside the nose Headache Increase in blood pressure Muscle spasms Stuffy nose Toothache This list may not describe all possible side effects. Call your doctor for  medical advice about side effects. You may report side effects to FDA at 1-800-FDA-1088.  Where should I keep my medication? Because this is an emergency medication, you should keep it with you at all times.  Keep out of the reach of children and pets. Store between 20 and 25 degrees C (68 and 77 degrees F). Do not freeze. Throw away any unused medication after the  expiration date. Keep in original box until ready to use.  NOTE: This sheet is a summary. It may not cover all possible information. If you have questions about this medicine, talk to your doctor, pharmacist, or health care provider.   2023 [employer]/Gold Standard (2020-09-26 00:00:00)  ____________________________________________________________________________________________

## 2022-09-30 NOTE — Progress Notes (Unsigned)
PROVIDER NOTE: Information contained herein reflects review and annotations entered in association with encounter. Interpretation of such information and data should be left to medically-trained personnel. Information provided to patient can be located elsewhere in the medical record under "Patient Instructions". Document created using STT-dictation technology, any transcriptional errors that may result from process are unintentional.    Patient: Dylan [name]  Service Category: E/M  Provider: Oswaldo Done, MD  DOB: 02/23/1950  DOS: 10/06/2022  Referring Provider: Brett Albino*  MRN: [id number]  Specialty: Interventional Pain Management  PCP: [name], MD  Type: Established Patient  Setting: Ambulatory outpatient    Location: Office  Delivery: Face-to-face     HPI  [name], a 72 y.o. year old male, is here today because of his Chronic pain syndrome [G89.4]. Mr. Talton's primary complain today is Testicle Pain  Pertinent problems: Mr. 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 Pain Assessment: Severity of Chronic pain is reported as a 1 /10. Location: Scrotum Left/denies. Onset: More than a month ago. Quality: Archie Patten. Timing: Constant. Modifying factor(s): meds. Vitals:  height is 5\' 10"  (1.778 m) and weight is 186 lb (84.4 kg). His temporal temperature is 97.9 F (36.6 C). His blood pressure is 141/69 (abnormal) and his pulse is 75. His oxygen saturation is 100%.  BMI: Estimated body mass index is 26.69 kg/m as calculated from the following:   Height as of this encounter: 5\' 10"  (1.778 m).   Weight as of this encounter: 186 lb (84.4 kg). Last encounter:  04/07/2022. Last procedure: Visit date not found.  Reason for encounter: medication management.  The patient indicates doing well with the current medication regimen. No adverse reactions or side effects reported to the medications.  Today the patient indicated having had a single episode after having been painting the house of some lower extremity cramps.  Today I have provided him with some written information on how to treat muscle cramps and spasms with over-the-counter medications.  He was instructed to let me know if the problem persists for longer than 90 days in which case then it would fall within the range of chronic problems that we may do further workup.  The patient understood and accepted.  Routine UDS ordered today.   RTCB: 04/09/2023   Pharmacotherapy Assessment  Analgesic: Tramadol 50 mg, 1 tab PO q6 hrs (200 mg/day) MME/day: 20 mg/day.   Monitoring: [place] PMP: PDMP reviewed during this encounter.       Pharmacotherapy: No side-effects or adverse reactions reported. Compliance: No problems identified. Effectiveness: Clinically acceptable.  Florina Ou, RN  10/06/2022  8:08 AM  Signed Nursing Pain Medication Assessment:  Safety precautions to be maintained throughout the outpatient stay will include: orient to surroundings, keep bed in low position, maintain call bell within reach at all times, provide assistance with transfer out of bed and ambulation.  Medication Inspection Compliance: Pill count conducted under aseptic conditions, in front of the patient. Neither the pills nor the bottle was removed from the patient's sight at any time. Once count was completed pills were immediately returned to the patient in their original bottle.  Medication: Tramadol (Ultram) Pill/Patch Count:  64 of 120 pills remain Pill/Patch Appearance: Markings consistent with prescribed medication Bottle  Appearance: Standard pharmacy container. Clearly labeled. Filled Date: 8 / 6 / 2024 Last  Medication intake:  YesterdaySafety precautions to be maintained throughout the outpatient stay will include: orient to surroundings, keep bed in low position, maintain call bell within reach at all times, provide assistance with transfer out of bed and ambulation.     No results found for: "CBDTHCR" No results found for: "D8THCCBX" No results found for: "D9THCCBX"  UDS:  Summary  Date Value Ref Range Status  10/07/2021 Note  Final    Comment:    ==================================================================== ToxASSURE Select 13 (MW) ==================================================================== Test                             Result       Flag       Units  Drug Present and Declared for Prescription Verification   Tramadol                       >2660        EXPECTED   ng/mg creat   O-Desmethyltramadol            >2660        EXPECTED   ng/mg creat   N-Desmethyltramadol            1484         EXPECTED   ng/mg creat    Source of tramadol is a prescription medication. O-desmethyltramadol    and N-desmethyltramadol are expected metabolites of tramadol.  ==================================================================== Test                      Result    Flag   Units      Ref Range   Creatinine              188              mg/dL      >=16 ==================================================================== Declared Medications:  The flagging and interpretation on this report are based on the  following declared medications.  Unexpected results may arise from  inaccuracies in the declared medications.   **Note: The testing scope of this panel includes these medications:   Tramadol (Ultram)   **Note: The testing scope of this panel does not include the  following reported medications:   Amlodipine  Atorvastatin  Docusate  Eye Drop  Hydrochlorothiazide  Tamsulosin ==================================================================== For clinical consultation,  please call [phone]. ====================================================================       ROS  Constitutional: Denies any fever or chills Gastrointestinal: No reported hemesis, hematochezia, vomiting, or acute GI distress Musculoskeletal: Denies any acute onset joint swelling, redness, loss of ROM, or weakness Neurological: No reported episodes of acute onset apraxia, aphasia, dysarthria, agnosia, amnesia, paralysis, loss of coordination, or loss of consciousness  Medication Review  amLODipine, atorvastatin, hydrochlorothiazide, latanoprost, and traMADol  History Review  Allergy: Mr. Bence has No Known Allergies. Drug: Mr. Himmelman  reports no history of drug use. Alcohol:  reports that he does not currently use alcohol. Tobacco:  reports that he has never smoked. He has never used smokeless tobacco. Social: Mr. Akkerman  reports that he has never smoked. He has never used smokeless tobacco. He reports that he does not currently use alcohol. He reports that he does not use drugs. Medical:  has a past medical history of Genital disorder, male (08/16/2005), Hypertension, Lumbago (01/17/2009), Pain syndrome, chronic, and Sciatica. Surgical: Mr. Conrow  has a past surgical history that includes Back surgery (02/01/2009); Cystoscopy with insertion of urolift; and Cystoscopy with insertion of urolift (N/A, 09/03/2021). Family: family history is not on file.  Laboratory Chemistry Profile   Renal Lab Results  Component Value Date   BUN 15 06/14/2022   CREATININE 0.89 06/14/2022   BCR 17 06/14/2022   GFRAA 104 12/20/2019   GFRNONAA 90 12/20/2019    Hepatic Lab Results  Component Value Date   AST 29 06/14/2022   ALT 21 06/14/2022   ALBUMIN 4.5 06/14/2022   ALKPHOS 90 06/14/2022   HCVAB NEGATIVE 02/28/2009    Electrolytes Lab Results  Component Value Date   NA 139 06/14/2022   K 3.9 06/14/2022   CL 100 06/14/2022   CALCIUM 9.7 06/14/2022   MG 2.5 (H) 12/08/2018   PHOS  3.3 10/14/2011    Bone Lab Results  Component Value Date   VD25OH 25.44 (L) 12/08/2018   25OHVITD1 34 01/19/2016   25OHVITD2 1.2 01/19/2016   25OHVITD3 33 01/19/2016    Inflammation (CRP: Acute Phase) (ESR: Chronic Phase) Lab Results  Component Value Date   CRP 2.0 (H) 12/08/2018   ESRSEDRATE 9 12/08/2018         Note: Above Lab results reviewed.  Recent Imaging Review  CT ABDOMEN PELVIS W WO CONTRAST CLINICAL DATA:  Gross hematuria  EXAM: CT ABDOMEN AND PELVIS WITHOUT AND WITH CONTRAST  TECHNIQUE: Multidetector CT imaging of the abdomen and pelvis was performed following the standard protocol before and following the bolus administration of intravenous contrast.  CONTRAST:  ISOVUE-300 IOPAMIDOL (ISOVUE-300) INJECTION 61%  COMPARISON:  CT 03/23/2008  FINDINGS: Lower chest: Lung bases are clear.  Hepatobiliary: No focal hepatic lesion. No biliary duct dilatation. Gallbladder is normal. Common bile duct is normal.  Pancreas: Pancreas is normal. No ductal dilatation. No pancreatic inflammation.  Spleen: Normal spleen  Adrenals/urinary tract: Adrenal glands are normal. No nephrolithiasis ureterolithiasis. No enhancing renal cortical lesion. No filling defects within collecting systems or ureters. No bladder calculi, enhancing bladder lesions, or filling defect within the bladder.  Stomach/Bowel: Stomach, small bowel, appendix, and cecum are normal. The colon and rectosigmoid colon are normal.  Vascular/Lymphatic: Abdominal aorta is normal caliber with atherosclerotic calcification. There is no retroperitoneal or periportal lymphadenopathy. No pelvic lymphadenopathy.  Reproductive: Prostate normal  Other: No free fluid.  Musculoskeletal: No aggressive osseous lesion.  IMPRESSION: 1. No explanation for hematuria. No nephrolithiasis, ureterolithiasis, enhancing renal cortical lesion, or filling defects within the collecting systems. 2. No bladder  stones or filling defects in the bladder which does not excluded a bladder lesion.  Electronically Signed   By: Genevive Bi M.D.   On: 11/26/2016 09:22 Note: Reviewed        Physical Exam  General appearance: Well nourished, well developed, and well hydrated. In no apparent acute distress Mental status: Alert, oriented x 3 (person, place, & time)       Respiratory: No evidence of acute respiratory distress Eyes: PERLA Vitals: BP (!) 141/69 (BP Location: Right Arm, Patient Position: Sitting, Cuff Size: Normal)   Pulse 75   Temp 97.9 F (36.6 C) (Temporal)   Ht 5\' 10"  (1.778 m)   Wt 186 lb (84.4 kg)   SpO2 100%   BMI 26.69 kg/m  BMI: Estimated body mass index is 26.69 kg/m as calculated from the following:   Height as of this encounter: 5\' 10"  (1.778 m).   Weight as of this encounter: 186 lb (84.4 kg). Ideal:  Ideal body weight: 73 kg (160 lb 15 oz) Adjusted ideal body weight: 77.5 kg (170 lb 15.4 oz)  Assessment   Diagnosis Status  1. Chronic pain syndrome   2. Orchialgia (1ry area of Pain) (Left)   3. Chronic groin pain (2ry area of Pain) (Left)   4. 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 Neurogenic pain   6. Pharmacologic therapy   7. Chronic use of opiate for therapeutic purpose   8. Encounter for medication management   9. Encounter for chronic pain management    Controlled Controlled Controlled   Updated Problems: No problems updated.  Plan of Care  Problem-specific:  No problem-specific Assessment & Plan notes found for this encounter.  Mr. DWIJ FOSHEE has a current medication list which includes the following long-term medication(s): amlodipine, atorvastatin, hydrochlorothiazide, and [START ON 10/11/2022] tramadol.  Pharmacotherapy (Medications Ordered): Meds ordered this encounter  Medications   DISCONTD: traMADol (ULTRAM) 50 MG tablet    Sig: Take 1 tablet (50 mg total) by mouth every 6 (six) hours as needed for severe pain. Each refill must last  30 days.    Dispense:  120 tablet    Refill:  5    Dispense 1 day early if closed on refill date. Avoid benzodiazepines within 8 hours of opioids. Do not send refill requests.   traMADol (ULTRAM) 50 MG tablet    Sig: Take 1 tablet (50 mg total) by mouth every 6 (six) hours as needed for severe pain. Each refill must last 30 days.    Dispense:  120 tablet    Refill:  5    Dispense 1 day early if closed on refill date. Avoid benzodiazepines within 8 hours of opioids. Do not send refill requests.   Orders:  Orders Placed This Encounter  Procedures   ToxASSURE Select 13 (MW), Urine    Volume: 30 ml(s). Minimum 3 ml of urine is needed. Document temperature of fresh sample. Indications: Long term (current) use of opiate analgesic (Z61.096)    Order Specific Question:   Release to patient    Answer:   Immediate   Nursing Instructions:    1). STAT: UDS required today. 2). Make sure to document all opioids and benzodiazepines taken, including time of last intake. 3). If order is entered on a procedure day, make sure sample is obtained before any medications are administered.   Follow-up plan:   Return in about 6 months (around 04/09/2023) for Eval-day (M,W), (F2F), (MM).      Interventional Therapies  Risk Factors  Considerations:      Planned  Pending:      Under consideration:      Completed:   None since initial evaluation   Therapeutic  Palliative (PRN) options:   None   Pharmacotherapy  Nonopioids transferred 12/19/2019: Gabapentin       Recent Visits No visits were found meeting these conditions. Showing recent visits within past 90 days and meeting all other requirements Today's Visits Date Type Provider Dept  10/06/22 Office Visit Delano Metz, MD [facility]  Showing today's visits and meeting all other requirements Future Appointments No visits were found meeting these conditions. Showing future appointments within next 90 days and meeting  all other requirements  I discussed the assessment and treatment plan with the patient. The patient was provided an opportunity to ask questions and all were answered. The patient agreed with the plan and demonstrated an understanding of the instructions.  Patient advised to call  back or seek an in-person evaluation if the symptoms or condition worsens.  Duration of encounter: 30 minutes.  Total time on encounter, as per AMA guidelines included both the face-to-face and non-face-to-face time personally spent by the physician and/or other qualified health care professional(s) on the day of the encounter (includes time in activities that require the physician or other qualified health care professional and does not include time in activities normally performed by clinical staff). Physician's time may include the following activities when performed: Preparing to see the patient (e.g., pre-charting review of records, searching for previously ordered imaging, lab work, and nerve conduction tests) Review of prior analgesic pharmacotherapies. Reviewing PMP Interpreting ordered tests (e.g., lab work, imaging, nerve conduction tests) Performing post-procedure evaluations, including interpretation of diagnostic procedures Obtaining and/or reviewing separately obtained history Performing a medically appropriate examination and/or evaluation Counseling and educating the patient/family/caregiver Ordering medications, tests, or procedures Referring and communicating with other health care professionals (when not separately reported) Documenting clinical information in the electronic or other health record Independently interpreting results (not separately reported) and communicating results to the patient/ family/caregiver Care coordination (not separately reported)  Note by: Oswaldo Done, MD Date: 10/06/2022; Time: 8:48 AM

## 2022-10-05 ENCOUNTER — Ambulatory Visit (INDEPENDENT_AMBULATORY_CARE_PROVIDER_SITE_OTHER): Payer: Medicare Other | Admitting: Family Medicine

## 2022-10-05 ENCOUNTER — Encounter: Payer: Self-pay | Admitting: Family Medicine

## 2022-10-05 ENCOUNTER — Ambulatory Visit: Payer: Self-pay | Admitting: *Deleted

## 2022-10-05 VITALS — BP 134/78 | HR 75 | Temp 99.0°F | Ht 70.0 in | Wt 186.0 lb

## 2022-10-05 DIAGNOSIS — M79672 Pain in left foot: Secondary | ICD-10-CM

## 2022-10-05 NOTE — Progress Notes (Signed)
Established patient visit   Patient: Dylan [name]   DOB: 1950-09-16   72 y.o. Male  MRN: [id number] Visit Date: 10/05/2022  Today's healthcare provider: Sherlyn Hay, DO   Chief Complaint  Patient presents with   Heel/Ankle pain    Patient states he noted sharp shooting pain in his left ankle about noon today.  He states it lasted about 2 hours and now he feels fine.  He reports he was doing some painting that required an extension ladder and going up and down the ladder may have been a contributing factor.  Other than that he notes no known injury.   Subjective    HPI sharp shooting pain in his left ankle about noon today. He states it lasted about 2 hours and now he feels fine.   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  - Otherwise, no known mechanism of injury  - took his tramadol around 1200. Symptoms lasted for two hours (up through 2pm)       - normally takes for pain in his scrotum, prescribed by Dr. Laban Emperor (pain management)   Does occasionally get "restless leg" which he describes as more of his foot.     Medications: Outpatient Medications Prior to Visit  Medication Sig   amLODipine (NORVASC) 10 MG tablet TAKE 1 TABLET BY MOUTH DAILY   atorvastatin (LIPITOR) 40 MG tablet Take 1 tablet (40 mg total) by mouth daily.   hydrochlorothiazide (HYDRODIURIL) 25 MG tablet TAKE ONE TABLET (25 MG TOTAL) BY MOUTH DAILY   latanoprost (XALATAN) 0.005 % ophthalmic solution Place into both eyes daily.    traMADol (ULTRAM) 50 MG tablet Take 1 tablet (50 mg total) by mouth every 6 (six) hours as needed for  severe pain. Each refill must last 30 days.   tamsulosin (FLOMAX) 0.4 MG CAPS capsule Take 1 capsule (0.4 mg total) by mouth daily. (Patient not taking: Reported on 10/05/2022)   traZODone (DESYREL) 50 MG tablet Take 0.5-1 tablets (25-50 mg total) by mouth at bedtime as needed for sleep. (Patient not taking: Reported on 10/05/2022)   No facility-administered medications prior to visit.    Review of Systems  Musculoskeletal:  Positive for arthralgias (Pelvic) and back pain.  Neurological:  Positive for numbness (See HPI). Negative for syncope and weakness.        Objective    BP 134/78 (BP Location: Left Arm, Patient Position: Sitting, Cuff Size: Normal)   Pulse 75   Temp 99 F (37.2 C) (Oral)   Ht 5\' 10"  (1.778 m)   Wt 186 lb (84.4 kg)   SpO2 99%   BMI 26.69 kg/m     Physical Exam Vitals and nursing note reviewed.  Constitutional:      General: He is not in acute distress.    Appearance: Normal appearance.  HENT:     Head: Normocephalic and atraumatic.  Eyes:     General: No scleral icterus.    Conjunctiva/sclera: Conjunctivae normal.  Cardiovascular:     Rate and Rhythm: Normal rate.  Pulses:          Dorsalis pedis pulses are 2+ on the left side.       Posterior tibial pulses are 2+ on the left side.  Pulmonary:     Effort: Pulmonary effort is normal.  Musculoskeletal:     Left foot: Normal range of motion. No deformity.       Feet:  Feet:     Left foot:     Skin integrity: Dry skin present. No ulcer, blister, skin breakdown, erythema, warmth or fissure.     Toenail Condition: Left toenails are long.     Comments: Pain located in the region marked in red Neurological:     Mental Status: He is alert and oriented to person, place, and time. Mental status is at baseline.  Psychiatric:        Mood and Affect: Mood normal.        Behavior: Behavior normal.      No results found for any visits on 10/05/22.  Assessment & Plan    Pain of left  heel  Patient's pain had already resolved prior to coming in today.  It lasted 2 to 4 hours after starting; he had taken a tramadol and started, which did not seem to correspond with the resolution of pain.  Of note, the pain is located in the same area as the numbness he has had since his back surgery 15 years ago.  I suspect he managed to tweak his back in the process of getting up and down the ladder and painting.  - Printed several exercise handouts and went over them with him.  Discussed the importance of doing core-strengthening exercises in order to support his back and prevent this from occurring again.  -Will not prescribe medication to address this at this time, given that it has resolved.  However, if it recurs in the next couple of days, advised patient to reach out and I will send gabapentin to try to address the issue.  Return if symptoms worsen or fail to improve.      I discussed the assessment and treatment plan with the patient  The patient was provided an opportunity to ask questions and all were answered. The patient agreed with the plan and demonstrated an understanding of the instructions.   The patient was advised to call back or seek an in-person evaluation if the symptoms worsen or if the condition fails to improve as anticipated.    Sherlyn Hay, DO  [facility] [phone] (phone) [phone] (fax)  [facility]

## 2022-10-05 NOTE — Telephone Encounter (Signed)
Reason for Disposition  [1] MODERATE pain (e.g., interferes with normal activities, limping) AND [2] present > 3 days  Answer Assessment - Initial Assessment Questions 1. ONSET: "When did the pain start?"      Having sharp pain in left heel.     It's a nerve area that is grabbing.    It comes on quickly and only lasts 3-4 seconds then lets off.    If I'm standing on left leg I would loss my balance.    2. LOCATION: "Where is the pain located?"      Left heel. I was on a ladder painting  for 2  1/2 hours this morning.   3. PAIN: "How bad is the pain?"    (Scale 1-10; or mild, moderate, severe)  - MILD (1-3): doesn't interfere with normal activities.   - MODERATE (4-7): interferes with normal activities (e.g., work or school) or awakens from sleep, limping.   - SEVERE (8-10): excruciating pain, unable to do any normal activities, unable to walk.      Sharp pains 4. WORK OR EXERCISE: "Has there been any recent work or exercise that involved this part of the body?"      Climbing on a ladder this morning. 5. CAUSE: "What do you think is causing the foot pain?"     Being up on the ladder this morning. 6. OTHER SYMPTOMS: "Do you have any other symptoms?" (e.g., leg pain, rash, fever, numbness)     No 7. PREGNANCY: "Is there any chance you are pregnant?" "When was your last menstrual period?"     N/A  Protocols used: Foot Pain-A-[facility]

## 2022-10-05 NOTE — Telephone Encounter (Signed)
  Chief Complaint: Left heel pain after being up on a ladder this morning for 2 1/2 hours painting. Symptoms: Sharp pains in left heel that come and go.   Also losses his balance if standing on left leg when the pain hits. Frequency: Intermittent today Pertinent Negatives: Patient denies prior injuries or accidents Disposition: [] ED /[] Urgent Care (no appt availability in office) / [x] Appointment(In office/virtual)/ []  [place] [facility]/ [] Home Care/ [] Refused Recommended Disposition /[] [facility]/ []  Follow-up with PCP Additional Notes: Appt made for today with Dr. Payton Mccallum for 2:40.

## 2022-10-06 ENCOUNTER — Encounter: Payer: Self-pay | Admitting: Pain Medicine

## 2022-10-06 ENCOUNTER — Ambulatory Visit: Payer: Medicare Other | Attending: Pain Medicine | Admitting: Pain Medicine

## 2022-10-06 VITALS — BP 141/69 | HR 75 | Temp 97.9°F | Ht 70.0 in | Wt 186.0 lb

## 2022-10-06 DIAGNOSIS — N509 Disorder of male genital organs, unspecified: Secondary | ICD-10-CM | POA: Diagnosis not present

## 2022-10-06 DIAGNOSIS — G8929 Other chronic pain: Secondary | ICD-10-CM | POA: Insufficient documentation

## 2022-10-06 DIAGNOSIS — N50812 Left testicular pain: Secondary | ICD-10-CM | POA: Insufficient documentation

## 2022-10-06 DIAGNOSIS — G894 Chronic pain syndrome: Secondary | ICD-10-CM | POA: Insufficient documentation

## 2022-10-06 DIAGNOSIS — Z79891 Long term (current) use of opiate analgesic: Secondary | ICD-10-CM | POA: Insufficient documentation

## 2022-10-06 DIAGNOSIS — Z79899 Other long term (current) drug therapy: Secondary | ICD-10-CM | POA: Insufficient documentation

## 2022-10-06 DIAGNOSIS — R1032 Left lower quadrant pain: Secondary | ICD-10-CM | POA: Insufficient documentation

## 2022-10-06 DIAGNOSIS — M792 Neuralgia and neuritis, unspecified: Secondary | ICD-10-CM | POA: Insufficient documentation

## 2022-10-06 MED ORDER — TRAMADOL HCL 50 MG PO TABS: 50.0000 mg | ORAL_TABLET | Freq: Four times a day (QID) | ORAL | 5 refills | Status: DC | PRN

## 2022-10-06 NOTE — Addendum Note (Signed)
Addended by: Delano Metz A on: 10/06/2022 08:20 AM   Modules accepted: Orders

## 2022-10-06 NOTE — Addendum Note (Signed)
Addended by: Delano Metz A on: 10/06/2022 08:23 AM   Modules accepted: Orders

## 2022-10-06 NOTE — Progress Notes (Signed)
Nursing Pain Medication Assessment:  Safety precautions to be maintained throughout the outpatient stay will include: orient to surroundings, keep bed in low position, maintain call bell within reach at all times, provide assistance with transfer out of bed and ambulation.  Medication Inspection Compliance: Pill count conducted under aseptic conditions, in front of the patient. Neither the pills nor the bottle was removed from the patient's sight at any time. Once count was completed pills were immediately returned to the patient in their original bottle.  Medication: Tramadol (Ultram) Pill/Patch Count:  64 of 120 pills remain Pill/Patch Appearance: Markings consistent with prescribed medication Bottle Appearance: Standard pharmacy container. Clearly labeled. Filled Date: 8 / 6 / 2024 Last Medication intake:  YesterdaySafety precautions to be maintained throughout the outpatient stay will include: orient to surroundings, keep bed in low position, maintain call bell within reach at all times, provide assistance with transfer out of bed and ambulation.

## 2022-10-13 LAB — TOXASSURE SELECT 13 (MW), URINE

## 2022-10-19 DIAGNOSIS — C44612 Basal cell carcinoma of skin of right upper limb, including shoulder: Secondary | ICD-10-CM | POA: Diagnosis not present

## 2022-11-23 DIAGNOSIS — H401332 Pigmentary glaucoma, bilateral, moderate stage: Secondary | ICD-10-CM | POA: Diagnosis not present

## 2022-12-14 NOTE — Progress Notes (Unsigned)
      Established patient visit   Patient: Dylan [name]   DOB: 11-07-1950   72 y.o. Male  MRN: [id number] Visit Date: 12/15/2022  Today's healthcare provider: [name], MD   No chief complaint on file.  Subjective       Discussed the use of AI scribe software for clinical note transcription with the patient, who gave verbal consent to proceed.  History of Present Illness             Past Medical History:  Diagnosis Date   Genital disorder, male 08/16/2005   Hypertension    Lumbago 01/17/2009   Pain syndrome, chronic    Sciatica     Medications: Outpatient Medications Prior to Visit  Medication Sig   amLODipine (NORVASC) 10 MG tablet TAKE 1 TABLET BY MOUTH DAILY   atorvastatin (LIPITOR) 40 MG tablet Take 1 tablet (40 mg total) by mouth daily.   hydrochlorothiazide (HYDRODIURIL) 25 MG tablet TAKE ONE TABLET (25 MG TOTAL) BY MOUTH DAILY   latanoprost (XALATAN) 0.005 % ophthalmic solution Place into both eyes daily.    traMADol (ULTRAM) 50 MG tablet Take 1 tablet (50 mg total) by mouth every 6 (six) hours as needed for severe pain. Each refill must last 30 days.   No facility-administered medications prior to visit.    Review of Systems  {Insert previous labs (optional):23779} {See past labs  Heme  Chem  Endocrine  Serology  Results Review (optional):1}   Objective    There were no vitals taken for this visit. {Insert last BP/Wt (optional):23777}{See vitals history (optional):1}      Physical Exam  ***  No results found for any visits on 12/15/22.  Assessment & Plan     Problem List Items Addressed This Visit   None   Assessment and Plan              No follow-ups on file.         [name], MD  [facility] [phone] (phone) [phone] (fax)  [facility]

## 2022-12-15 ENCOUNTER — Encounter: Payer: Self-pay | Admitting: Family Medicine

## 2022-12-15 ENCOUNTER — Other Ambulatory Visit: Payer: Self-pay | Admitting: Family Medicine

## 2022-12-15 ENCOUNTER — Ambulatory Visit (INDEPENDENT_AMBULATORY_CARE_PROVIDER_SITE_OTHER): Payer: Medicare Other | Admitting: Family Medicine

## 2022-12-15 VITALS — BP 146/78 | HR 72 | Resp 18 | Ht 70.0 in | Wt 192.0 lb

## 2022-12-15 DIAGNOSIS — G894 Chronic pain syndrome: Secondary | ICD-10-CM

## 2022-12-15 DIAGNOSIS — I10 Essential (primary) hypertension: Secondary | ICD-10-CM

## 2022-12-15 DIAGNOSIS — E781 Pure hyperglyceridemia: Secondary | ICD-10-CM | POA: Diagnosis not present

## 2022-12-15 DIAGNOSIS — M7989 Other specified soft tissue disorders: Secondary | ICD-10-CM | POA: Diagnosis not present

## 2022-12-15 DIAGNOSIS — E559 Vitamin D deficiency, unspecified: Secondary | ICD-10-CM

## 2022-12-15 DIAGNOSIS — F4322 Adjustment disorder with anxiety: Secondary | ICD-10-CM

## 2022-12-15 DIAGNOSIS — Z0000 Encounter for general adult medical examination without abnormal findings: Secondary | ICD-10-CM | POA: Insufficient documentation

## 2022-12-15 MED ORDER — HYDROCHLOROTHIAZIDE 25 MG PO TABS: ORAL_TABLET | ORAL | 3 refills | Status: DC

## 2022-12-15 MED ORDER — ATORVASTATIN CALCIUM 40 MG PO TABS
40.0000 mg | ORAL_TABLET | Freq: Every day | ORAL | 3 refills | Status: DC
Start: 2022-12-15 — End: 2022-12-15

## 2022-12-15 MED ORDER — AMLODIPINE BESYLATE 10 MG PO TABS: 10.0000 mg | ORAL_TABLET | Freq: Every day | ORAL | 3 refills | Status: DC

## 2022-12-15 MED ORDER — VALSARTAN 40 MG PO TABS: 40.0000 mg | ORAL_TABLET | Freq: Every day | ORAL | 3 refills | Status: DC

## 2022-12-15 MED ORDER — ATORVASTATIN CALCIUM 40 MG PO TABS: 40.0000 mg | ORAL_TABLET | Freq: Every day | ORAL | 3 refills | Status: DC

## 2022-12-15 NOTE — Assessment & Plan Note (Signed)
Chronic essential hypertension. Blood pressure today is 146/78 mmHg. Discussed potential contribution of amlodipine to left leg swelling. Plan to trial off amlodipine and switch to valsartan, an ARB, which may help reduce swelling. Emphasized importance of maintaining blood pressure control to reduce stroke and heart attack risk. - chronic  - Discontinue amlodipine 10 mg daily - Prescribe valsartan 40 mg daily - Continue hydrochlorothiazide 25 mg daily - Follow-up in 2 weeks to reassess blood pressure and swelling

## 2022-12-15 NOTE — Patient Instructions (Addendum)
It was a pleasure to see you today!  Thank you for choosing [facility] for your primary care.   Dylan [name] was seen for blood pressure follow up.     To keep you healthy, please keep in mind the following health maintenance items that you are due for:   Shingrix vaccine  COVID booster Tetanus booster (Td) vaccine     Best Wishes,   Dr. Roxan Hockey    VISIT SUMMARY:  Today, we discussed your ongoing health concerns, including hypertension, hypertriglyceridemia, chronic pain syndrome, and vitamin D insufficiency. We also reviewed your recent lab results and addressed the swelling in your left leg. Additionally, we talked about the importance of keeping your vaccinations up to date.  YOUR PLAN:  -HYPERTENSION: Hypertension, or high blood pressure, can increase the risk of heart disease and stroke. We will discontinue your current medication, amlodipine, which may be contributing to the swelling in your left leg, and start you on valsartan 40 mg daily. Please continue taking hydrochlorothiazide 25 mg daily. We will reassess your blood pressure and swelling in 2 weeks.  -HYPERTRIGLYCERIDEMIA: Hypertriglyceridemia means you have high levels of triglycerides in your blood, which can increase the risk of heart disease. Continue taking Lipitor 40 mg daily. We will order a lipid panel to check your levels and provide dietary counseling to help lower your triglycerides by limiting sugars, breads, and pastas.  -CHRONIC PAIN SYNDROME: Chronic pain syndrome involves long-term pain that is managed by a specialist. Continue taking tramadol 50 mg every 6 hours as needed for severe pain and follow up with your pain management specialist as scheduled.  -VITAMIN D INSUFFICIENCY: Vitamin D insufficiency means you have lower than normal levels of vitamin D, which is important for bone health. We did not discuss changes to your current management today.  -ADJUSTMENT REACTION WITH  ANXIETY: Adjustment reaction with anxiety is a temporary condition that occurs in response to a stressful event. Your anxiety has improved and is currently well-managed.  -GENERAL HEALTH MAINTENANCE: We discussed the need for updated vaccinations. You will receive the Shingrix vaccine (2 doses, 2 months apart), a TD booster, and an updated COVID vaccine.  INSTRUCTIONS:  Please follow up in 2 weeks to reassess your blood pressure and swelling. We will also order a complete metabolic panel and thyroid studies at that time.

## 2022-12-15 NOTE — Telephone Encounter (Signed)
Sent to a different pharmacy as provider ordered.   Requested Prescriptions  Pending Prescriptions Disp Refills   atorvastatin (LIPITOR) 40 MG tablet 90 tablet 3    Sig: Take 1 tablet (40 mg total) by mouth daily.     Cardiovascular:  Antilipid - Statins Failed - 12/15/2022  5:44 PM      Failed - Lipid Panel in normal range within the last 12 months    Cholesterol, Total  Date Value Ref Range Status  06/14/2022 162 100 - 199 mg/dL Final   LDL Chol Calc (NIH)  Date Value Ref Range Status  06/14/2022 66 0 - 99 mg/dL Final   HDL  Date Value Ref Range Status  06/14/2022 40 >39 mg/dL Final   Triglycerides  Date Value Ref Range Status  06/14/2022 357 (H) 0 - 149 mg/dL Final         Passed - Patient is not pregnant      Passed - Valid encounter within last 12 months    Recent Outpatient Visits           Today Essential hypertension   [place] [facility] Simmons-Robinson, [place], MD   2 months ago Pain of left heel   [place] [place], Monico Blitz, DO   6 months ago Encounter for annual wellness visit (AWV) in Medicare patient   [place] [facility] Simmons-Robinson, [place], MD   8 months ago Essential hypertension   [place] [facility] [place], [place], MD   1 year ago Chronic nasal congestion   [place] [facility] [place], [place], MD       Future Appointments             In 2 weeks Simmons-Robinson, Tawanna Cooler, MD [facility]             hydrochlorothiazide (HYDRODIURIL) 25 MG tablet 90 tablet 3    Sig: TAKE ONE TABLET (25 MG TOTAL) BY MOUTH DAILY     Cardiovascular: Diuretics - Thiazide Failed - 12/15/2022  5:44 PM      Failed - Cr in normal range and within 180 days    Creatinine  Date Value Ref Range Status  10/14/2011 0.80 0.60 - 1.30 mg/dL Final   Creatinine, Ser  Date Value Ref Range Status   06/14/2022 0.89 0.76 - 1.27 mg/dL Final         Failed - K in normal range and within 180 days    Potassium  Date Value Ref Range Status  06/14/2022 3.9 3.5 - 5.2 mmol/L Final  10/14/2011 4.1 3.5 - 5.1 mmol/L Final         Failed - Na in normal range and within 180 days    Sodium  Date Value Ref Range Status  06/14/2022 139 134 - 144 mmol/L Final  10/14/2011 139 136 - 145 mmol/L Final         Failed - Last BP in normal range    BP Readings from Last 1 Encounters:  12/15/22 (!) 146/78         Passed - Valid encounter within last 6 months    Recent Outpatient Visits           Today Essential hypertension   [place] [facility] [place], [place], MD   2 months ago Pain of left heel   [facility] Sherlyn Hay, DO   6 months ago Encounter for annual wellness  visit (AWV) in Medicare patient   [place] [facility] Simmons-Robinson, [place], MD   8 months ago Essential hypertension   [place] [facility] [place], [place], MD   1 year ago Chronic nasal congestion   [place] [facility] [place], [place], MD       Future Appointments             In 2 weeks Simmons-Robinson, Tawanna Cooler, MD [facility]             valsartan (DIOVAN) 40 MG tablet 90 tablet 3    Sig: Take 1 tablet (40 mg total) by mouth daily.     Cardiovascular:  Angiotensin Receptor Blockers Failed - 12/15/2022  5:44 PM      Failed - Cr in normal range and within 180 days    Creatinine  Date Value Ref Range Status  10/14/2011 0.80 0.60 - 1.30 mg/dL Final   Creatinine, Ser  Date Value Ref Range Status  06/14/2022 0.89 0.76 - 1.27 mg/dL Final         Failed - K in normal range and within 180 days    Potassium  Date Value Ref Range Status  06/14/2022 3.9 3.5 - 5.2 mmol/L Final  10/14/2011 4.1 3.5 - 5.1 mmol/L Final         Failed - Last BP  in normal range    BP Readings from Last 1 Encounters:  12/15/22 (!) 146/78         Passed - Patient is not pregnant      Passed - Valid encounter within last 6 months    Recent Outpatient Visits           Today Essential hypertension   [place] [facility] [place], [place], MD   2 months ago Pain of left heel   [facility] [place], Monico Blitz, DO   6 months ago Encounter for annual wellness visit (AWV) in Medicare patient   [place] [facility] Simmons-Robinson, [place], MD   8 months ago Essential hypertension   [place] [facility] Simmons-Robinson, [place], MD   1 year ago Chronic nasal congestion   [place] [facility] Simmons-Robinson, [place], MD       Future Appointments             In 2 weeks Simmons-Robinson, Tawanna Cooler, MD [facility]

## 2022-12-15 NOTE — Assessment & Plan Note (Signed)
Discussed need for updated vaccinations including Shingrix, TD booster, and COVID vaccines. Explained that Shingrix is 90% effective at preventing shingles but may cause flu-like symptoms. -recommend  Shingrix vaccine (2 doses, 2 months apart) -recommended TD booster vaccine - recommended updated COVID vaccine

## 2022-12-15 NOTE — Assessment & Plan Note (Signed)
Chronic  Stable  No current supplementation

## 2022-12-15 NOTE — Assessment & Plan Note (Signed)
Elevated triglycerides, last measured at 357 mg/dL in May 1610. LDL was 66 mg/dL. ASCVD score is elevated at 25.8%. Discussed dietary modifications to limit sugars, breads, and pastas to help lower triglycerides. - chronic  - Continue Lipitor 40 mg daily - Order lipid panel - Provide dietary counseling to limit sugars, breads, and pastas

## 2022-12-15 NOTE — Assessment & Plan Note (Signed)
Managed by pain management specialist Dr. Butch Penny. Continues to use tramadol 50 mg every 6 hours as needed for severe pain. - Continue tramadol 50 mg every 6 hours as needed - Continue follow-up with pain management specialist as scheduled

## 2022-12-15 NOTE — Telephone Encounter (Signed)
Patient called stated he needs to make a change to his pharmacy as his insurance is not accepted at [facility]. He needs his meds atorvastatin (LIPITOR) 40 MG tablet  hydrochlorothiazide (HYDRODIURIL) 25 MG tablet valsartan (DIOVAN) 40 MG tablet sent to  [facility] [address] ([address]), [place] - [address] Phone: [phone]  Fax: [phone]

## 2022-12-15 NOTE — Assessment & Plan Note (Signed)
Chronic swelling  No signs of infection in LLE today  1+ pitting noted  CMP, CBC, TSH,free T4 and T3 collected today  DC amlodipine

## 2022-12-16 LAB — CMP14+EGFR
ALT: 25 [IU]/L (ref 0–44)
AST: 29 [IU]/L (ref 0–40)
Albumin: 4.4 g/dL (ref 3.8–4.8)
Alkaline Phosphatase: 88 [IU]/L (ref 44–121)
BUN/Creatinine Ratio: 16 (ref 10–24)
BUN: 15 mg/dL (ref 8–27)
Bilirubin Total: 0.4 mg/dL (ref 0.0–1.2)
CO2: 25 mmol/L (ref 20–29)
Calcium: 9.5 mg/dL (ref 8.6–10.2)
Chloride: 99 mmol/L (ref 96–106)
Creatinine, Ser: 0.92 mg/dL (ref 0.76–1.27)
Globulin, Total: 2.1 g/dL (ref 1.5–4.5)
Glucose: 112 mg/dL — ABNORMAL HIGH (ref 70–99)
Potassium: 4.1 mmol/L (ref 3.5–5.2)
Sodium: 141 mmol/L (ref 134–144)
Total Protein: 6.5 g/dL (ref 6.0–8.5)
eGFR: 89 mL/min/{1.73_m2} (ref >59)

## 2022-12-16 LAB — LIPID PANEL
Chol/HDL Ratio: 3.5 ratio (ref 0.0–5.0)
Cholesterol, Total: 151 mg/dL (ref 100–199)
HDL: 43 mg/dL (ref >39)
LDL Chol Calc (NIH): 70 mg/dL (ref 0–99)
Triglycerides: 232 mg/dL — ABNORMAL HIGH (ref 0–149)
VLDL Cholesterol Cal: 38 mg/dL (ref 5–40)

## 2022-12-16 LAB — TSH+T4F+T3FREE
Free T4: 1.15 ng/dL (ref 0.82–1.77)
T3, Free: 3.5 pg/mL (ref 2.0–4.4)
TSH: 1.09 u[IU]/mL (ref 0.450–4.500)

## 2022-12-29 ENCOUNTER — Encounter: Payer: Self-pay | Admitting: Family Medicine

## 2022-12-29 ENCOUNTER — Ambulatory Visit (INDEPENDENT_AMBULATORY_CARE_PROVIDER_SITE_OTHER): Payer: Medicare Other | Admitting: Family Medicine

## 2022-12-29 VITALS — BP 120/66 | HR 64 | Temp 98.4°F | Ht 70.0 in | Wt 191.0 lb

## 2022-12-29 DIAGNOSIS — I10 Essential (primary) hypertension: Secondary | ICD-10-CM | POA: Diagnosis not present

## 2022-12-29 NOTE — Assessment & Plan Note (Signed)
Chronic hypertension, well-controlled on valsartan 40 mg and hydrochlorothiazide 25 mg. Blood pressure today was 138/83 mmHg initially, but a manual reading showed 120/66 mmHg. No adverse reactions to current medications. Recent metabolic panel showed normal potassium and kidney function. Discussed the importance of monitoring potassium and kidney function due to the change from amlodipine to valsartan. Patient prefers manual blood pressure readings. - Continue valsartan 40 mg daily - Continue hydrochlorothiazide 25 mg daily - Order basic metabolic panel to monitor potassium and kidney function - Follow up in May for physical wellness exam

## 2022-12-29 NOTE — Progress Notes (Signed)
Established patient visit   Patient: Dylan [name]   DOB: [birth year]   72 y.o. Male  MRN: [id number] Visit Date: 12/29/2022  Today's healthcare provider: [name], MD   Chief Complaint  Patient presents with   Hypertension    Patient presents for follow up.  He does not check his blood pressure at home.  He reports good compliance and tolerance of the medication.  Management changes at that time were the discontinuation of Amlodipine and start of  Valsartan.   Subjective     HPI     Hypertension    Additional comments: Patient presents for follow up.  He does not check his blood pressure at home.  He reports good compliance and tolerance of the medication.  Management changes at that time were the discontinuation of Amlodipine and start of  Valsartan.      Last edited by Adline Peals, CMA on 12/29/2022 10:41 AM.       Discussed the use of AI scribe software for clinical note transcription with the patient, who gave verbal consent to proceed.  History of Present Illness   The patient is a 72 year old male with a history of hypertension, who recently transitioned from amlodipine to valsartan 40mg . The patient reports no adverse reactions to the new medication and denies any symptoms of dizziness or lightheadedness. The patient's blood pressure was measured at 138/83 during the visit.  The patient's most recent metabolic panel, completed on November 13th, showed a normal potassium level of 4.  The patient does not report any other health concerns at this time.         Past Medical History:  Diagnosis Date   Genital disorder, male 08/16/2005   Hypertension    Lumbago 01/17/2009   Pain syndrome, chronic    Sciatica     Medications: Outpatient Medications Prior to Visit  Medication Sig   atorvastatin (LIPITOR) 40 MG tablet Take 1 tablet (40 mg total) by mouth daily.   hydrochlorothiazide (HYDRODIURIL) 25 MG tablet TAKE ONE TABLET (25 MG  TOTAL) BY MOUTH DAILY   latanoprost (XALATAN) 0.005 % ophthalmic solution Place into both eyes daily.    traMADol (ULTRAM) 50 MG tablet Take 1 tablet (50 mg total) by mouth every 6 (six) hours as needed for severe pain. Each refill must last 30 days.   valsartan (DIOVAN) 40 MG tablet Take 1 tablet (40 mg total) by mouth daily.   No facility-administered medications prior to visit.    Review of Systems  Last metabolic panel Lab Results  Component Value Date   GLUCOSE 112 (H) 12/15/2022   NA 141 12/15/2022   K 4.1 12/15/2022   CL 99 12/15/2022   CO2 25 12/15/2022   BUN 15 12/15/2022   CREATININE 0.92 12/15/2022   EGFR 89 12/15/2022   CALCIUM 9.5 12/15/2022   PHOS 3.3 10/14/2011   PROT 6.5 12/15/2022   ALBUMIN 4.4 12/15/2022   LABGLOB 2.1 12/15/2022   AGRATIO 2.3 (H) 06/14/2022   BILITOT 0.4 12/15/2022   ALKPHOS 88 12/15/2022   AST 29 12/15/2022   ALT 25 12/15/2022   ANIONGAP 8 12/08/2018        Objective    BP 120/66   Pulse 64   Temp 98.4 F (36.9 C) (Oral)   Ht 5\' 10"  (1.778 m)   Wt 191 lb (86.6 kg)   SpO2 100%   BMI 27.41 kg/m  BP Readings from Last 3 Encounters:  12/29/22 120/66  12/15/22 (!) 146/78  10/06/22 (!) 141/69   Wt Readings from Last 3 Encounters:  12/29/22 191 lb (86.6 kg)  12/15/22 192 lb (87.1 kg)  10/06/22 186 lb (84.4 kg)        Physical Exam  General: Alert, no acute distress Cardio: Normal S1 and S2, RRR, no r/m/g Pulm: CTAB, normal work of breathing Abdomen: Bowel sounds normal. Abdomen soft and non-tender.  Extremities: No peripheral edema.    No results found for any visits on 12/29/22.  Assessment & Plan     Problem List Items Addressed This Visit       Cardiovascular and Mediastinum   Essential hypertension - Primary    Chronic hypertension, well-controlled on valsartan 40 mg and hydrochlorothiazide 25 mg. Blood pressure today was 138/83 mmHg initially, but a manual reading showed 120/66 mmHg. No adverse reactions to  current medications. Recent metabolic panel showed normal potassium and kidney function. Discussed the importance of monitoring potassium and kidney function due to the change from amlodipine to valsartan. Patient prefers manual blood pressure readings. - Continue valsartan 40 mg daily - Continue hydrochlorothiazide 25 mg daily - Order basic metabolic panel to monitor potassium and kidney function - Follow up in May for physical wellness exam      Relevant Orders   Basic Metabolic Panel (BMET)     General Health Maintenance Discussed shingles vaccination. Advised to get the vaccine at a pharmacy due to Medicare coverage issues. - Recommend shingles vaccination at a pharmacy.         Return in about 24 weeks (around 06/15/2023) for AWV.         [name], MD  [facility] [phone] (phone) [phone] (fax)  [facility]

## 2022-12-30 LAB — BASIC METABOLIC PANEL
BUN/Creatinine Ratio: 18 (ref 10–24)
BUN: 14 mg/dL (ref 8–27)
CO2: 26 mmol/L (ref 20–29)
Calcium: 9.2 mg/dL (ref 8.6–10.2)
Chloride: 100 mmol/L (ref 96–106)
Creatinine, Ser: 0.79 mg/dL (ref 0.76–1.27)
Glucose: 101 mg/dL — ABNORMAL HIGH (ref 70–99)
Potassium: 4 mmol/L (ref 3.5–5.2)
Sodium: 140 mmol/L (ref 134–144)
eGFR: 94 mL/min/{1.73_m2} (ref >59)

## 2023-01-17 DIAGNOSIS — H6063 Unspecified chronic otitis externa, bilateral: Secondary | ICD-10-CM | POA: Diagnosis not present

## 2023-01-17 DIAGNOSIS — H6123 Impacted cerumen, bilateral: Secondary | ICD-10-CM | POA: Diagnosis not present

## 2023-03-31 NOTE — Patient Instructions (Signed)
 ______________________________________________________________________    Opioid Pain Medication Update  To: All patients taking opioid pain medications. (I.e.: hydrocodone, hydromorphone, oxycodone, oxymorphone, morphine, codeine, methadone, tapentadol, tramadol, buprenorphine, fentanyl, etc.)  Re: Updated review of side effects and adverse reactions of opioid analgesics, as well as new information about long term effects of this class of medications.  Direct risks of long-term opioid therapy are not limited to opioid addiction and overdose. Potential medical risks include serious fractures, breathing problems during sleep, hyperalgesia, immunosuppression, chronic constipation, bowel obstruction, myocardial infarction, and tooth decay secondary to xerostomia.  Unpredictable adverse effects that can occur even if you take your medication correctly: Cognitive impairment, respiratory depression, and death. Most people think that if they take their medication "correctly", and "as instructed", that they will be safe. Nothing could be farther from the truth. In reality, a significant amount of recorded deaths associated with the use of opioids has occurred in individuals that had taken the medication for a long time, and were taking their medication correctly. The following are examples of how this can happen: Patient taking his/her medication for a long time, as instructed, without any side effects, is given a certain antibiotic or another unrelated medication, which in turn triggers a "Drug-to-drug interaction" leading to disorientation, cognitive impairment, impaired reflexes, respiratory depression or an untoward event leading to serious bodily harm or injury, including death.  Patient taking his/her medication for a long time, as instructed, without any side effects, develops an acute impairment of liver and/or kidney function. This will lead to a rapid inability of the body to breakdown and eliminate  their pain medication, which will result in effects similar to an "overdose", but with the same medicine and dose that they had always taken. This again may lead to disorientation, cognitive impairment, impaired reflexes, respiratory depression or an untoward event leading to serious bodily harm or injury, including death.  A similar problem will occur with patients as they grow older and their liver and kidney function begins to decrease as part of the aging process.  Background information: Historically, the original case for using long-term opioid therapy to treat chronic noncancer pain was based on safety assumptions that subsequent experience has called into question. In 1996, the American Pain Society and the American Academy of Pain Medicine issued a consensus statement supporting long-term opioid therapy. This statement acknowledged the dangers of opioid prescribing but concluded that the risk for addiction was low; respiratory depression induced by opioids was short-lived, occurred mainly in opioid-naive patients, and was antagonized by pain; tolerance was not a common problem; and efforts to control diversion should not constrain opioid prescribing. This has now proven to be wrong. Experience regarding the risks for opioid addiction, misuse, and overdose in community practice has failed to support these assumptions.  According to the Centers for Disease Control and Prevention, fatal overdoses involving opioid analgesics have increased sharply over the past decade. Currently, more than 96,700 people die from drug overdoses every year. Opioids are a factor in 7 out of every 10 overdose deaths. Deaths from drug overdose have surpassed motor vehicle accidents as the leading cause of death for individuals between the ages of 65 and 55.  Clinical data suggest that neuroendocrine dysfunction may be very common in both men and women, potentially causing hypogonadism, erectile dysfunction, infertility,  decreased libido, osteoporosis, and depression. Recent studies linked higher opioid dose to increased opioid-related mortality. Controlled observational studies reported that long-term opioid therapy may be associated with increased risk for cardiovascular events. Subsequent  meta-analysis concluded that the safety of long-term opioid therapy in elderly patients has not been proven.   Side Effects and adverse reactions: Common side effects: Drowsiness (sedation). Dizziness. Nausea and vomiting. Constipation. Physical dependence -- Dependence often manifests with withdrawal symptoms when opioids are discontinued or decreased. Tolerance -- As you take repeated doses of opioids, you require increased medication to experience the same effect of pain relief. Respiratory depression -- This can occur in healthy people, especially with higher doses. However, people with COPD, asthma or other lung conditions may be even more susceptible to fatal respiratory impairment.  Uncommon side effects: An increased sensitivity to feeling pain and extreme response to pain (hyperalgesia). Chronic use of opioids can lead to this. Delayed gastric emptying (the process by which the contents of your stomach are moved into your small intestine). Muscle rigidity. Immune system and hormonal dysfunction. Quick, involuntary muscle jerks (myoclonus). Arrhythmia. Itchy skin (pruritus). Dry mouth (xerostomia).  Long-term side effects: Chronic constipation. Sleep-disordered breathing (SDB). Increased risk of bone fractures. Hypothalamic-pituitary-adrenal dysregulation. Increased risk of overdose.  RISKS: Respiratory depression and death: Opioids increase the risk of respiratory depression and death.  Drug-to-drug interactions: Opioids are relatively contraindicated in combination with benzodiazepines, sleep inducers, and other central nervous system depressants. Other classes of medications (i.e.: certain antibiotics  and even over-the-counter medications) may also trigger or induce respiratory depression in some patients.  Medical conditions: Patients with pre-existing respiratory problems are at higher risk of respiratory failure and/or depression when in combination with opioid analgesics. Opioids are relatively contraindicated in some medical conditions such as central sleep apnea.   Fractures and Falls:  Opioids increase the risk and incidence of falls. This is of particular importance in elderly patients.  Endocrine System:  Long-term administration is associated with endocrine abnormalities (endocrinopathies). (Also known as Opioid-induced Endocrinopathy) Influences on both the hypothalamic-pituitary-adrenal axis?and the hypothalamic-pituitary-gonadal axis have been demonstrated with consequent hypogonadism and adrenal insufficiency in both sexes. Hypogonadism and decreased levels of dehydroepiandrosterone sulfate have been reported in men and women. Endocrine effects include: Amenorrhoea in women (abnormal absence of menstruation) Reduced libido in both sexes Decreased sexual function Erectile dysfunction in men Hypogonadisms (decreased testicular function with shrinkage of testicles) Infertility Depression and fatigue Loss of muscle mass Anxiety Depression Immune suppression Hyperalgesia Weight gain Anemia Osteoporosis Patients (particularly women of childbearing age) should avoid opioids. There is insufficient evidence to recommend routine monitoring of asymptomatic patients taking opioids in the long-term for hormonal deficiencies.  Immune System: Human studies have demonstrated that opioids have an immunomodulating effect. These effects are mediated via opioid receptors both on immune effector cells and in the central nervous system. Opioids have been demonstrated to have adverse effects on antimicrobial response and anti-tumour surveillance. Buprenorphine has been demonstrated to have  no impact on immune function.  Opioid Induced Hyperalgesia: Human studies have demonstrated that prolonged use of opioids can lead to a state of abnormal pain sensitivity, sometimes called opioid induced hyperalgesia (OIH). Opioid induced hyperalgesia is not usually seen in the absence of tolerance to opioid analgesia. Clinically, hyperalgesia may be diagnosed if the patient on long-term opioid therapy presents with increased pain. This might be qualitatively and anatomically distinct from pain related to disease progression or to breakthrough pain resulting from development of opioid tolerance. Pain associated with hyperalgesia tends to be more diffuse than the pre-existing pain and less defined in quality. Management of opioid induced hyperalgesia requires opioid dose reduction.  Cancer: Chronic opioid therapy has been associated with an increased risk  of cancer among noncancer patients with chronic pain. This association was more evident in chronic strong opioid users. Chronic opioid consumption causes significant pathological changes in the small intestine and colon. Epidemiological studies have found that there is a link between opium dependence and initiation of gastrointestinal cancers. Cancer is the second leading cause of death after cardiovascular disease. Chronic use of opioids can cause multiple conditions such as GERD, immunosuppression and renal damage as well as carcinogenic effects, which are associated with the incidence of cancers.   Mortality: Long-term opioid use has been associated with increased mortality among patients with chronic non-cancer pain (CNCP).  Prescription of long-acting opioids for chronic noncancer pain was associated with a significantly increased risk of all-cause mortality, including deaths from causes other than overdose.  Reference: Von Korff M, Kolodny A, Deyo RA, Chou R. Long-term opioid therapy reconsidered. Ann Intern Med. 2011 Sep 6;155(5):325-8. doi:  10.7326/[id number]. PMID: 16109604; PMCID: [id number]. Randon Goldsmith, Hayward RA, Dunn KM, [place] KP. Risk of adverse events in patients prescribed long-term opioids: A cohort study in the [place] [facility] Research Datalink. Eur J Pain. 2019 May;23(5):908-922. doi: 10.1002/ejp.1357. Epub 2019 Jan 31. PMID: 78295621. Colameco S, Coren JS, Ciervo CA. Continuous opioid treatment for chronic noncancer pain: a time for moderation in prescribing. Postgrad Med. 2009 Jul;121(4):61-6. doi: 10.3810/pgm.2009.07.2032. PMID: 30865784. William Hamburger RN, [place] SD, Blazina I, Cristopher Peru, Bougatsos C, Deyo RA. The effectiveness and risks of long-term opioid therapy for chronic pain: a systematic review for a [employer] of [employer] Pathways to [employer]. Ann Intern Med. 2015 Feb 17;162(4):276-86. doi: 10.7326/M14-2559. PMID: 69629528. [facility], Makuc DM. NCHS Data Brief No. 22. [place]: Centers for Disease Control and Prevention; 2009. Sep, Increase in Fatal Poisonings Involving Opioid Analgesics in the [place], 1999-2006. Song IA, Choi HR, Oh TK. Long-term opioid use and mortality in patients with chronic non-cancer pain: Ten-year follow-up study in [place] from 2010 through 2019. EClinicalMedicine. 2022 Jul 18;51:101558. doi: 10.1016/j.eclinm.2022.413244. PMID: 01027253; PMCID: [id number]. Huser, W., Schubert, T., Vogelmann, T. et al. All-cause mortality in patients with long-term opioid therapy compared with non-opioid analgesics for chronic non-cancer pain: a database study. BMC Med 18, 162 (2020). http://lester.info/ Rashidian H, Karie Kirks, Malekzadeh R, Haghdoost AA. An Ecological Study of the Association between Opiate Use and Incidence of Cancers. Addict Health. 2016 Fall;8(4):252-260. PMID: 59563875; PMCID: [id number].  Our Goal: Our goal is to control your pain with means other  than the use of opioid pain medications.  Our Recommendation: Talk to your physician about coming off of these medications. We can assist you with the tapering down and stopping these medicines. Based on the new information, even if you cannot completely stop the medication, a decrease in the dose may be associated with a lesser risk. Ask for other means of controlling the pain. Decrease or eliminate those factors that significantly contribute to your pain such as smoking, obesity, and a diet heavily tilted towards "inflammatory" nutrients.  Last Updated: 08/09/2022   ______________________________________________________________________       ______________________________________________________________________    National Pain Medication Shortage  The [place] is experiencing worsening drug shortages. These have had a negative widespread effect on patient care and treatment. Not expected to improve any time soon. Predicted to last past 2029.   Drug shortage list (generic names) Oxycodone IR Oxycodone/APAP Oxymorphone IR Hydromorphone Hydrocodone/APAP Morphine  Where is the problem?  Manufacturing and supply level.  Will this shortage  affect you?  Only if you take any of the above pain medications.  How? You may be unable to fill your prescription.  Your pharmacist may offer a "partial fill" of your prescription. (Warning: Do not accept partial fills.) Prescriptions partially filled cannot be transferred to another pharmacy. Read our Medication Rules and Regulation. Depending on how much medicine you are dependent on, you may experience withdrawals when unable to get the medication.  Recommendations: Consider ending your dependence on opioid pain medications. Ask your pain specialist to assist you with the process. Consider switching to a medication currently not in shortage, such as Buprenorphine. Talk to your pain specialist about this option. Consider decreasing your pain  medication requirements by managing tolerance thru "Drug Holidays". This may help minimize withdrawals, should you run out of medicine. Control your pain thru the use of non-pharmacological interventional therapies.   Your prescriber: Prescribers cannot be blamed for shortages. Medication manufacturing and supply issues cannot be fixed by the prescriber.   NOTE: The prescriber is not responsible for supplying the medication, or solving supply issues. Work with your pharmacist to solve it. The patient is responsible for the decision to take or continue taking the medication and for identifying and securing a legal supply source. By law, supplying the medication is the job and responsibility of the pharmacy. The prescriber is responsible for the evaluation, monitoring, and prescribing of these medications.   Prescribers will NOT: Re-issue prescriptions that have been partially filled. Re-issue prescriptions already sent to a pharmacy.  Re-send prescriptions to a different pharmacy because yours did not have your medication. Ask pharmacist to order more medicine or transfer the prescription to another pharmacy. (Read below.)  New 2023 regulation: "October 02, 2021 Revised Regulation Allows DEA-Registered Pharmacies to Transfer Electronic Prescriptions at a Patient's Request DEA Headquarters Division - Public Information Office Patients now have the ability to request their electronic prescription be transferred to another pharmacy without having to go back to their practitioner to initiate the request. This revised regulation went into effect on Monday, September 28, 2021.     At a patient's request, a DEA-registered retail pharmacy can now transfer an electronic prescription for a controlled substance (schedules II-V) to another DEA-registered retail pharmacy. Prior to this change, patients would have to go through their practitioner to cancel their prescription and have it re-issued to a different  pharmacy. The process was taxing and time consuming for both patients and practitioners.    The Drug Enforcement Administration [facility]) published its intent to revise the process for transferring electronic prescriptions on December 21, 2019.  The final rule was published in the federal register on August 27, 2021 and went into effect 30 days later.  Under the final rule, a prescription can only be transferred once between pharmacies, and only if allowed under existing state or other applicable law. The prescription must remain in its electronic form; may not be altered in any way; and the transfer must be communicated directly between two licensed pharmacists. It's important to note, any authorized refills transfer with the original prescription, which means the entire prescription will be filled at the same pharmacy".  Reference: [email or web address].is [facility] website announcement)  [email or web address] [employer] of Justice)   [employer] / Vol. 88, No. 143 / Thursday, August 27, 2021 / Rules and Regulations DEPARTMENT OF JUSTICE  Drug Enforcement Administration  21 CFR Part 1306  [Docket No. [id number]]  RIN [id number] Transfer of Electronic Prescriptions for Schedules II-V Controlled Substances Between  Pharmacies for Initial Filling  ______________________________________________________________________       ______________________________________________________________________    Transfer of Pain Medication between Pharmacies  Re: 2023 DEA Clarification on existing regulation  Published on DEA Website: October 02, 2021  Title: Revised Regulation Allows DEA-Registered Pharmacies to Electrical engineer Prescriptions at a Patient's Request DEA Headquarters Division - [employer]  "Patients now have the ability to  request their electronic prescription be transferred to another pharmacy without having to go back to their practitioner to initiate the request. This revised regulation went into effect on Monday, September 28, 2021.     At a patient's request, a DEA-registered retail pharmacy can now transfer an electronic prescription for a controlled substance (schedules II-V) to another DEA-registered retail pharmacy. Prior to this change, patients would have to go through their practitioner to cancel their prescription and have it re-issued to a different pharmacy. The process was taxing and time consuming for both patients and practitioners.    The Drug Enforcement Administration [facility]) published its intent to revise the process for transferring electronic prescriptions on December 21, 2019.  The final rule was published in the federal register on August 27, 2021 and went into effect 30 days later.  Under the final rule, a prescription can only be transferred once between pharmacies, and only if allowed under existing state or other applicable law. The prescription must remain in its electronic form; may not be altered in any way; and the transfer must be communicated directly between two licensed pharmacists. It's important to note, any authorized refills transfer with the original prescription, which means the entire prescription will be filled at the same pharmacy."    REFERENCES: 1. DEA website announcement [email or web address]  2. Department of Justice website  [email or web address].pdf  3. DEPARTMENT OF JUSTICE Drug Enforcement Administration 21 CFR Part 1306 [Docket No. [id number]] RIN [id number] "Transfer of Electronic Prescriptions for Schedules II-V Controlled Substances Between Pharmacies for Initial  Filling"  ______________________________________________________________________       ______________________________________________________________________    Medication Rules  Purpose: To inform patients, and their family members, of our medication rules and regulations.  Applies to: All patients receiving prescriptions from our practice (written or electronic).  Pharmacy of record: This is the pharmacy where your electronic prescriptions will be sent. Make sure we have the correct one.  Electronic prescriptions: In compliance with the [facility] Strengthen Opioid Misuse Prevention (STOP) Act of 2017 (Session Conni Elliot [phone]), effective February 01, 2018, all controlled substances must be electronically prescribed. Written prescriptions, faxing, or calling prescriptions to a pharmacy will no longer be done.  Prescription refills: These will be provided only during in-person appointments. No medications will be renewed without a "face-to-face" evaluation with your provider. Applies to all prescriptions.  NOTE: The following applies primarily to controlled substances (Opioid* Pain Medications).   Type of encounter (visit): For patients receiving controlled substances, face-to-face visits are required. (Not an option and not up to the patient.)  Patient's Responsibilities: Pain Pills: Bring all pain pills to every appointment (except for procedure appointments). Pill counts are required.  Pill Bottles: Bring pills in original pharmacy bottle. Bring bottle, even if empty. Always bring the bottle of the most recent fill.  Medication refills: You are responsible for knowing and keeping track of what medications you are taking and when is it that you will need a refill. The day before your appointment: write a list of all prescriptions that need to be refilled. The day of the appointment: give the list to  the admitting nurse. Prescriptions will be written only during appointments. No  prescriptions will be written on procedure days. If you forget a medication: it will not be "Called in", "Faxed", or "electronically sent". You will need to get another appointment to get these prescribed. No early refills. Do not call asking to have your prescription filled early. Partial  or short prescriptions: Occasionally your pharmacy may not have enough pills to fill your prescription.  NEVER ACCEPT a partial fill or a prescription that is short of the total amount of pills that you were prescribed.  With controlled substances the law allows 72 hours for the pharmacy to complete the prescription.  If the prescription is not completed within 72 hours, the pharmacist will require a new prescription to be written. This means that you will be short on your medicine and we WILL NOT send another prescription to complete your original prescription.  Instead, request the pharmacy to send a carrier to a nearby branch to get enough medication to provide you with your full prescription. Prescription Accuracy: You are responsible for carefully inspecting your prescriptions before leaving our office. Have the discharge nurse carefully go over each prescription with you, before taking them home. Make sure that your name is accurately spelled, that your address is correct. Check the name and dose of your medication to make sure it is accurate. Check the number of pills, and the written instructions to make sure they are clear and accurate. Make sure that you are given enough medication to last until your next medication refill appointment. Taking Medication: Take medication as prescribed. When it comes to controlled substances, taking less pills or less frequently than prescribed is permitted and encouraged. Never take more pills than instructed. Never take the medication more frequently than prescribed.  Inform other Doctors: Always inform, all of your healthcare providers, of all the medications you take. Pain  Medication from other Providers: You are not allowed to accept any additional pain medication from any other Doctor or Healthcare provider. There are two exceptions to this rule. (see below) In the event that you require additional pain medication, you are responsible for notifying us, as stated below. Cough Medicine: Often these contain an opioid, such as codeine or hydrocodone. Never accept or take cough medicine containing these opioids if you are already taking an opioid* medication. The combination may cause respiratory failure and death. Medication Agreement: You are responsible for carefully reading and following our Medication Agreement. This must be signed before receiving any prescriptions from our practice. Safely store a copy of your signed Agreement. Violations to the Agreement will result in no further prescriptions. (Additional copies of our Medication Agreement are available upon request.) Laws, Rules, & Regulations: All patients are expected to follow all [address] and [employer], [employer], Rules, [place] [employer]. Ignorance of the Laws does not constitute a valid excuse.  Illegal drugs and Controlled Substances: The use of illegal substances (including, but not limited to marijuana and its derivatives) and/or the illegal use of any controlled substances is strictly prohibited. Violation of this rule may result in the immediate and permanent discontinuation of any and all prescriptions being written by our practice. The use of any illegal substances is prohibited. Adopted CDC guidelines & recommendations: Target dosing levels will be at or below 60 MME/day. Use of benzodiazepines** is not recommended. Urine Drug testing: Patients taking controlled substances will be required to provide a urine sample upon request. Do not void before coming to your medication management appointments.  Hold emptying your bladder until you are admitted. The admitting nurse will inform you if a sample is required. Our  practice reserves the right to call you at any time to provide a sample. Once receiving the call, you have 24 hours to comply with request. Not providing a sample upon request may result in termination of medication therapy.  Exceptions: There are only two exceptions to the rule of not receiving pain medications from other Healthcare Providers. Exception #1 (Emergencies): In the event of an emergency (i.e.: accident requiring emergency care), you are allowed to receive additional pain medication. However, you are responsible for: As soon as you are able, call our office [phone], at any time of the day or night, and leave a message stating your name, the date and nature of the emergency, and the name and dose of the medication prescribed. In the event that your call is answered by a member of our staff, make sure to document and save the date, time, and the name of the person that took your information.  Exception #2 (Planned Surgery): In the event that you are scheduled by another doctor or dentist to have any type of surgery or procedure, you are allowed (for a period no longer than 30 days), to receive additional pain medication, for the acute post-op pain. However, in this case, you are responsible for picking up a copy of our "Post-op Pain Management for Surgeons" handout, and giving it to your surgeon or dentist. This document is available at our office, and does not require an appointment to obtain it. Simply go to our office during business hours (Monday-Thursday from 8:00 AM to 4:00 PM) (Friday 8:00 AM to 12:00 Noon) or if you have a scheduled appointment with [place], prior to your surgery, and ask for it by name. In addition, you are responsible for: calling our office [phone], at any time of the day or night, and leaving a message stating your name, name of your surgeon, type of surgery, and date of procedure or surgery. Failure to comply with your responsibilities may result in termination  of therapy involving the controlled substances.  Consequences:  Non-compliance with the above rules may result in permanent discontinuation of medication prescription therapy. All patients receiving any type of controlled substance is expected to comply with the above patient responsibilities. Not doing so may result in permanent discontinuation of medication prescription therapy. Medication Agreement Violation. Following the above rules, including your responsibilities will help you in avoiding a Medication Agreement Violation ("Breaking your Pain Medication Contract").  *Opioid medications include: morphine, codeine, oxycodone, oxymorphone, hydrocodone, hydromorphone, meperidine, tramadol, tapentadol, buprenorphine, fentanyl, methadone. **Benzodiazepine medications include: diazepam (Valium), alprazolam (Xanax), clonazepam (Klonopine), lorazepam (Ativan), clorazepate (Tranxene), chlordiazepoxide (Librium), estazolam (Prosom), oxazepam (Serax), temazepam (Restoril), triazolam (Halcion) (Last updated: 11/24/2022) ______________________________________________________________________      ______________________________________________________________________    Medication Recommendations and Reminders  Applies to: All patients receiving prescriptions (written and/or electronic).  Medication Rules & Regulations: You are responsible for reading, knowing, and following our "Medication Rules" document. These exist for your safety and that of others. They are not flexible and neither are we. Dismissing or ignoring them is an act of "non-compliance" that may result in complete and irreversible termination of such medication therapy. For safety reasons, "non-compliance" will not be tolerated. As with the [place] fundamental legal principle of "ignorance of the law is no defense", we will accept no excuses for not having read and knowing the content of documents provided to you by  our practice.  Pharmacy of  record:  Definition: This is the pharmacy where your electronic prescriptions will be sent.  We do not endorse any particular pharmacy. It is up to you and your insurance to decide what pharmacy to use.  We do not restrict you in your choice of pharmacy. However, once we write for your prescriptions, we will NOT be re-sending more prescriptions to fix restricted supply problems created by your pharmacy, or your insurance.  The pharmacy listed in the electronic medical record should be the one where you want electronic prescriptions to be sent. If you choose to change pharmacy, simply notify our nursing staff. Changes will be made only during your regular appointments and not over the phone.  Recommendations: Keep all of your pain medications in a safe place, under lock and key, even if you live alone. We will NOT replace lost, stolen, or damaged medication. We do not accept "Police Reports" as proof of medications having been stolen. After you fill your prescription, take 1 week's worth of pills and put them away in a safe place. You should keep a separate, properly labeled bottle for this purpose. The remainder should be kept in the original bottle. Use this as your primary supply, until it runs out. Once it's gone, then you know that you have 1 week's worth of medicine, and it is time to come in for a prescription refill. If you do this correctly, it is unlikely that you will ever run out of medicine. To make sure that the above recommendation works, it is very important that you make sure your medication refill appointments are scheduled at least 1 week before you run out of medicine. To do this in an effective manner, make sure that you do not leave the office without scheduling your next medication management appointment. Always ask the nursing staff to show you in your prescription , when your medication will be running out. Then arrange for the receptionist to get you a return appointment, at least  7 days before you run out of medicine. Do not wait until you have 1 or 2 pills left, to come in. This is very poor planning and does not take into consideration that we may need to cancel appointments due to bad weather, sickness, or emergencies affecting our staff. DO NOT ACCEPT A "Partial Fill": If for any reason your pharmacy does not have enough pills/tablets to completely fill or refill your prescription, do not allow for a "partial fill". The law allows the pharmacy to complete that prescription within 72 hours, without requiring a new prescription. If they do not fill the rest of your prescription within those 72 hours, you will need a separate prescription to fill the remaining amount, which we will NOT provide. If the reason for the partial fill is your insurance, you will need to talk to the pharmacist about payment alternatives for the remaining tablets, but again, DO NOT ACCEPT A PARTIAL FILL, unless you can trust your pharmacist to obtain the remainder of the pills within 72 hours.  Prescription refills and/or changes in medication(s):  Prescription refills, and/or changes in dose or medication, will be conducted only during scheduled medication management appointments. (Applies to both, written and electronic prescriptions.) No refills on procedure days. No medication will be changed or started on procedure days. No changes, adjustments, and/or refills will be conducted on a procedure day. Doing so will interfere with the diagnostic portion of the procedure. No phone refills. No medications will be "  called into the pharmacy". No Fax refills. No weekend refills. No Holliday refills. No after hours refills.  Remember:  Business hours are:  Monday to Thursday 8:00 AM to 4:00 PM Provider's Schedule: Delano Metz, MD - Appointments are:  Medication management: Monday and Wednesday 8:00 AM to 4:00 PM Procedure day: Tuesday and Thursday 7:30 AM to 4:00 PM Edward Jolly, MD -  Appointments are:  Medication management: Tuesday and Thursday 8:00 AM to 4:00 PM Procedure day: Monday and Wednesday 7:30 AM to 4:00 PM (Last update: 11/24/2021) ______________________________________________________________________      ______________________________________________________________________    Drug Holidays  What is a "Drug Holiday"? Drug Holiday: is the name given to the process of slowly tapering down and temporarily stopping the pain medication for the purpose of decreasing or eliminating tolerance to the drug.  Benefits Improved effectiveness Decreased required effective dose Improved pain control End dependence on high dose therapy Decrease cost of therapy Uncovering "opioid-induced hyperalgesia". (OIH)  What is "opioid hyperalgesia"? It is a paradoxical increase in pain caused by exposure to opioids. Stopping the opioid pain medication, contrary to the expected, it actually decreases or completely eliminates the pain. Ref.: "A comprehensive review of opioid-induced hyperalgesia". Donney Rankins, et.al. Pain Physician. 2011 Mar-Apr;14(2):145-61.  What is tolerance? Tolerance: the progressive loss of effectiveness of a pain medicine due to repetitive use. A common problem of opioid pain medications.  How long should a "Drug Holiday" last? Effectiveness depends on the patient staying off all opioid pain medicines for a minimum of 14 consecutive days. (2 weeks) During this time the patient should not be taking any other opioid analgesic medication.  How about just taking less of the medicine? Does not work. Will not accomplish goal of eliminating the excess receptors.  How about switching to a different pain medicine? (AKA. "Opioid rotation") Does not work. Creates the illusion of effectiveness by taking advantage of inaccurate equivalent dose calculations between different opioids. -This "technique" was promoted by studies funded by [employer], such  as [employer], creators of "OxyContin".  Can I stop the medicine "cold [place]"? We do not recommend it. You should always coordinate with your prescribing physician to make the transition as smoothly as possible. Avoid stopping the medicine abruptly without consulting. We recommend a "slow taper".  What is a slow taper? Taper: refers to the gradual decrease in dose.   How do I stop/taper the dose? Slowly. Decrease the daily amount of pills that you take by one (1) pill every seven (7) days. This is called a "slow downward taper". Example: if you normally take four (4) pills per day, drop it to three (3) pills per day for seven (7) days, then to two (2) pills per day for seven (7) days, then to one (1) per day for seven (7) days, and then stop the medicine. The 14 day "Drug Holiday" starts on the first day without medicine.   Will I experience withdrawals? Unlikely with a slow taper.  What triggers withdrawals? Withdrawals are triggered by the sudden/abrupt stop of high dose opioids. Withdrawals can be avoided by slowly decreasing the dose over a prolonged period of time.  What are withdrawals? Symptoms associated with sudden/abrupt reduction/stopping of high-dose, long-term use of pain medication. Withdrawal are seldom seen on low dose therapy, or patients rarely taking opioid medication.  Early Withdrawal Symptoms may include: Agitation Anxiety Muscle aches Increased tearing Insomnia Runny nose Sweating Yawning  Late symptoms may include: Abdominal cramping Diarrhea Dilated pupils Goose bumps Nausea Vomiting  When could I see withdrawals? Onset: 8-24 hours after last use for most opioids. 12-48 hours for long-acting opioids (i.e.: methadone)  How long could they last? Duration: 4-10 days for most opioids. 14-21 days for long-acting opioids (i.e.: methadone)  What will happen after I complete my "Drug Holiday"? The need and indications for the opioid analgesic will be  reviewed before restarting the medication. Dose requirements will likely decrease and the dose will need to be adjusted accordingly.   (Last update: 11/24/2022) ______________________________________________________________________      ______________________________________________________________________     Naloxone Nasal Spray  Why am I receiving this medication? [place] STOP ACT requires that all patients taking high dose opioids or at risk of opioids respiratory depression, be prescribed an opioid reversal agent, such as Naloxone (AKA: Narcan).  What is this medication? NALOXONE (nal OX one) treats opioid overdose, which causes slow or shallow breathing, severe drowsiness, or trouble staying awake. Call emergency services after using this medication. You may need additional treatment. Naloxone works by reversing the effects of opioids. It belongs to a group of medications called opioid blockers.  COMMON BRAND NAME(S): Kloxxado, Narcan  What should I tell my care team before I take this medication? They need to know if you have any of these conditions: Heart disease Substance use disorder An unusual or allergic reaction to naloxone, other medications, foods, dyes, or preservatives Pregnant or trying to get pregnant Breast-feeding  When to use this medication? This medication is to be used for the treatment of respiratory depression (less than 8 breaths per minute) secondary to opioid overdose.   How to use this medication? This medication is for use in the nose. Lay the person on their back. Support their neck with your hand and allow the head to tilt back before giving the medication. The nasal spray should be given into 1 nostril. After giving the medication, move the person onto their side. Do not remove or test the nasal spray until ready to use. Get emergency medical help right away after giving the first dose of this medication, even if the person wakes up. You  should be familiar with how to recognize the signs and symptoms of a narcotic overdose. If more doses are needed, give the additional dose in the other nostril. Talk to your care team about the use of this medication in children. While this medication may be prescribed for children as young as newborns for selected conditions, precautions do apply.  Naloxone Overdosage: If you think you have taken too much of this medicine contact a poison control center or emergency room at once.  NOTE: This medicine is only for you. Do not share this medicine with others.  What if I miss a dose? This does not apply.  What may interact with this medication? This is only used during an emergency. No interactions are expected during emergency use. This list may not describe all possible interactions. Give your health care provider a list of all the medicines, herbs, non-prescription drugs, or dietary supplements you use. Also tell them if you smoke, drink alcohol, or use illegal drugs. Some items may interact with your medicine.  What should I watch for while using this medication? Keep this medication ready for use in the case of an opioid overdose. Make sure that you have the phone number of your care team and local hospital ready. You may need to have additional doses of this medication. Each nasal spray contains a single dose. Some emergencies may require  additional doses. After use, bring the treated person to the nearest hospital or call 911. Make sure the treating care team knows that the person has received a dose of this medication. You will receive additional instructions on what to do during and after use of this medication before an emergency occurs.  What side effects may I notice from receiving this medication? Side effects that you should report to your care team as soon as possible: Allergic reactions--skin rash, itching, hives, swelling of the face, lips, tongue, or throat Side effects that  usually do not require medical attention (report these to your care team if they continue or are bothersome): Constipation Dryness or irritation inside the nose Headache Increase in blood pressure Muscle spasms Stuffy nose Toothache This list may not describe all possible side effects. Call your doctor for medical advice about side effects. You may report side effects to FDA at 1-800-FDA-1088.  Where should I keep my medication? Because this is an emergency medication, you should keep it with you at all times.  Keep out of the reach of children and pets. Store between 20 and 25 degrees C (68 and 77 degrees F). Do not freeze. Throw away any unused medication after the expiration date. Keep in original box until ready to use.  NOTE: This sheet is a summary. It may not cover all possible information. If you have questions about this medicine, talk to your doctor, pharmacist, or health care provider.   2023 Elsevier/Gold Standard (2020-09-26 00:00:00)  ______________________________________________________________________

## 2023-03-31 NOTE — Progress Notes (Signed)
 PROVIDER NOTE: Information contained herein reflects review and annotations entered in association with encounter. Interpretation of such information and data should be left to medically-trained personnel. Information provided to patient can be located elsewhere in the medical record under "Patient Instructions". Document created using STT-dictation technology, any transcriptional errors that may result from process are unintentional.    Patient: Dylan [name]  Service Category: E/M  Provider: Oswaldo Done, MD  DOB: 09-09-1950  DOS: 04/04/2023  Referring Provider: Brett Albino*  MRN: [id number]  Specialty: Interventional Pain Management  PCP: [name], MD  Type: Established Patient  Setting: Ambulatory outpatient    Location: Office  Delivery: Face-to-face     HPI  [name], a 73 y.o. year old male, is here today because of his Chronic pain syndrome [G89.4]. [name]'s primary complain today is Testicle Pain (left)  Pertinent problems: 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 Pain Assessment: Severity of Chronic pain is reported as a 1 /10. Location: Scrotum Left/denies. Onset: More than a month ago. Quality: Aching. Timing: Intermittent. Modifying factor(s): medications. Vitals:  height is 5\' 10"  (1.778 m) and weight is 195 lb (88.5 kg). His temporal temperature is 97.8 F (36.6 C). His blood pressure is 134/73 and his pulse is 90. His respiration is 18 and oxygen saturation is 100%.  BMI: Estimated body mass index is 27.98 kg/m as calculated from the following:   Height as of this encounter: 5\' 10"  (1.778 m).   Weight as of this encounter: 195 lb (88.5 kg). Last encounter: 10/06/2022. Last procedure: Visit date not found.  Reason for encounter: medication management.  The  patient indicates doing well with the current medication regimen. No adverse reactions or side effects reported to the medications.   Discussed the use of AI scribe software for clinical note transcription with the patient, who gave verbal consent to proceed.  History of Present Illness   [name] "Dylan [name]" is a 73 year old male who presents with a follow-up regarding pain management.  His current medication regimen is effectively managing his pain, and he expresses satisfaction with the results, stating, 'I think it does a real good job.'  There have been no changes to his pharmacy, which remains the same as before. His medication refills are up to date.     RTCB: 10/06/2023   Pharmacotherapy Assessment  Analgesic: Tramadol 50 mg, 1 tab PO q6 hrs (200 mg/day) MME/day: 20 mg/day.   Monitoring: [place] PMP: PDMP reviewed during this encounter.       Pharmacotherapy: No side-effects or adverse reactions reported. Compliance: No problems identified. Effectiveness: Clinically acceptable.  Dylan Barbara, RN  04/04/2023  8:15 AM  Signed Nursing Pain Medication Assessment:  Safety precautions to be maintained throughout the outpatient stay will include: orient to surroundings, keep bed in low position, maintain call bell within reach at all times, provide assistance with transfer out of bed and ambulation.  Medication Inspection Compliance: Pill count conducted under aseptic conditions, in front of the patient. Neither the pills nor the bottle was removed from the patient's sight at any time. Once count was completed pills were immediately returned to the patient in their original bottle.  Medication: Tramadol (Ultram) Pill/Patch Count:  87 of 120 pills remain Pill/Patch Appearance: Markings consistent with prescribed medication Bottle Appearance: Standard pharmacy container. Clearly  labeled. Filled Date: 02 / 11 / 2025 Last Medication intake:  Today    No results found for:  "CBDTHCR" No results found for: "D8THCCBX" No results found for: "D9THCCBX"  UDS:  Summary  Date Value Ref Range Status  10/06/2022 Note  Final    Comment:    ==================================================================== [employer] Select 13 (MW) ==================================================================== Test                             Result       Flag       Units  Drug Present and Declared for Prescription Verification   Tramadol                       >6667        EXPECTED   ng/mg creat   O-Desmethyltramadol            >6667        EXPECTED   ng/mg creat   N-Desmethyltramadol            1324         EXPECTED   ng/mg creat    Source of tramadol is a prescription medication. O-desmethyltramadol    and N-desmethyltramadol are expected metabolites of tramadol.  ==================================================================== Test                      Result    Flag   Units      Ref Range   Creatinine              75               mg/dL      >=29 ==================================================================== Declared Medications:  The flagging and interpretation on this report are based on the  following declared medications.  Unexpected results may arise from  inaccuracies in the declared medications.   **Note: The testing scope of this panel includes these medications:   Tramadol (Ultram)   **Note: The testing scope of this panel does not include the  following reported medications:   Amlodipine (Norvasc)  Atorvastatin (Lipitor)  Hydrochlorothiazide  Latanoprost (Xalatan) ==================================================================== For clinical consultation, please call [phone]. ====================================================================       ROS  Constitutional: Denies any fever or chills Gastrointestinal: No reported hemesis, hematochezia, vomiting, or acute GI distress Musculoskeletal: Denies any acute onset joint  swelling, redness, loss of ROM, or weakness Neurological: No reported episodes of acute onset apraxia, aphasia, dysarthria, agnosia, amnesia, paralysis, loss of coordination, or loss of consciousness  Medication Review  atorvastatin, hydrochlorothiazide, latanoprost, naloxone, traMADol, and valsartan  History Review  Allergy: Mr. Mally has no known allergies. Drug: Mr. Bogan  reports no history of drug use. Alcohol:  reports that he does not currently use alcohol. Tobacco:  reports that he has never smoked. He has never used smokeless tobacco. Social: Mr. Heldman  reports that he has never smoked. He has never used smokeless tobacco. He reports that he does not currently use alcohol. He reports that he does not use drugs. Medical:  has a past medical history of Genital disorder, male (08/16/2005), Hypertension, Lumbago (01/17/2009), Pain syndrome, chronic, and Sciatica. Surgical: Mr. Squier  has a past surgical history that includes Back surgery (02/01/2009); Cystoscopy with insertion of urolift; and Cystoscopy with insertion of urolift (N/A, 09/03/2021). Family: family history is not on file.  Laboratory Chemistry Profile   Renal Lab Results  Component  Value Date   BUN 14 12/29/2022   CREATININE 0.79 12/29/2022   BCR 18 12/29/2022   GFRAA 104 12/20/2019   GFRNONAA 90 12/20/2019    Hepatic Lab Results  Component Value Date   AST 29 12/15/2022   ALT 25 12/15/2022   ALBUMIN 4.4 12/15/2022   ALKPHOS 88 12/15/2022   HCVAB NEGATIVE 02/28/2009    Electrolytes Lab Results  Component Value Date   NA 140 12/29/2022   K 4.0 12/29/2022   CL 100 12/29/2022   CALCIUM 9.2 12/29/2022   MG 2.5 (H) 12/08/2018   PHOS 3.3 10/14/2011    Bone Lab Results  Component Value Date   VD25OH 25.44 (L) 12/08/2018   25OHVITD1 34 01/19/2016   25OHVITD2 1.2 01/19/2016   25OHVITD3 33 01/19/2016    Inflammation (CRP: Acute Phase) (ESR: Chronic Phase) Lab Results  Component Value Date   CRP 2.0  (H) 12/08/2018   ESRSEDRATE 9 12/08/2018         Note: Above Lab results reviewed.  Recent Imaging Review  CT ABDOMEN PELVIS W WO CONTRAST CLINICAL DATA:  Gross hematuria  EXAM: CT ABDOMEN AND PELVIS WITHOUT AND WITH CONTRAST  TECHNIQUE: Multidetector CT imaging of the abdomen and pelvis was performed following the standard protocol before and following the bolus administration of intravenous contrast.  CONTRAST:  ISOVUE-300 IOPAMIDOL (ISOVUE-300) INJECTION 61%  COMPARISON:  CT 03/23/2008  FINDINGS: Lower chest: Lung bases are clear.  Hepatobiliary: No focal hepatic lesion. No biliary duct dilatation. Gallbladder is normal. Common bile duct is normal.  Pancreas: Pancreas is normal. No ductal dilatation. No pancreatic inflammation.  Spleen: Normal spleen  Adrenals/urinary tract: Adrenal glands are normal. No nephrolithiasis ureterolithiasis. No enhancing renal cortical lesion. No filling defects within collecting systems or ureters. No bladder calculi, enhancing bladder lesions, or filling defect within the bladder.  Stomach/Bowel: Stomach, small bowel, appendix, and cecum are normal. The colon and rectosigmoid colon are normal.  Vascular/Lymphatic: Abdominal aorta is normal caliber with atherosclerotic calcification. There is no retroperitoneal or periportal lymphadenopathy. No pelvic lymphadenopathy.  Reproductive: Prostate normal  Other: No free fluid.  Musculoskeletal: No aggressive osseous lesion.  IMPRESSION: 1. No explanation for hematuria. No nephrolithiasis, ureterolithiasis, enhancing renal cortical lesion, or filling defects within the collecting systems. 2. No bladder stones or filling defects in the bladder which does not excluded a bladder lesion.  Electronically Signed   By: Genevive Bi M.D.   On: 11/26/2016 09:22 Note: Reviewed        Physical Exam  General appearance: Well nourished, well developed, and well hydrated. In no  apparent acute distress Mental status: Alert, oriented x 3 (person, place, & time)       Respiratory: No evidence of acute respiratory distress Eyes: PERLA Vitals: BP 134/73   Pulse 90   Temp 97.8 F (36.6 C) (Temporal)   Resp 18   Ht 5\' 10"  (1.778 m)   Wt 195 lb (88.5 kg)   SpO2 100%   BMI 27.98 kg/m  BMI: Estimated body mass index is 27.98 kg/m as calculated from the following:   Height as of this encounter: 5\' 10"  (1.778 m).   Weight as of this encounter: 195 lb (88.5 kg). Ideal: Ideal body weight: 73 kg (160 lb 15 oz) Adjusted ideal body weight: 79.2 kg (174 lb 9 oz)  Assessment   Diagnosis Status  1. Chronic pain syndrome   2. Orchialgia (1ry area of Pain) (Left)   3. Chronic groin pain (2ry area of  Pain) (Left)   4. 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 Neurogenic pain   6. Pharmacologic therapy   7. Chronic use of opiate for therapeutic purpose   8. Encounter for medication management   9. Encounter for chronic pain management    Controlled Controlled Controlled   Updated Problems: No problems updated.  Plan of Care  Problem-specific:  Assessment and Plan    Chronic Pain Management Current medication regimen effectively manages pain. PMP check and urine drug screen are up to date. Send refills to pharmacy. Request urine sample for next refill.       Mr. DARY DILAURO has a current medication list which includes the following long-term medication(s): atorvastatin, hydrochlorothiazide, [START ON 04/09/2023] tramadol, and valsartan.  Pharmacotherapy (Medications Ordered): Meds ordered this encounter  Medications   traMADol (ULTRAM) 50 MG tablet    Sig: Take 1 tablet (50 mg total) by mouth every 6 (six) hours as needed for severe pain (pain score 7-10). Each refill must last 30 days.    Dispense:  120 tablet    Refill:  5    Dispense 1 day early if closed on refill date. Avoid benzodiazepines within 8 hours of opioids. Do not send refill requests.    naloxone (NARCAN) nasal spray 4 mg/0.1 mL    Sig: Place 1 spray into the nose as needed for up to 365 doses (for opioid-induced respiratory depresssion). In case of emergency (overdose), spray once into each nostril. If no response within 3 minutes, repeat application and call 911.    Dispense:  1 each    Refill:  1    Instruct patient in proper use of device.   Orders:  No orders of the defined types were placed in this encounter.  Follow-up plan:   Return in about 6 months (around 10/06/2023) for Eval-day (M,W), (F2F), (MM).      Interventional Therapies  Risk Factors  Considerations:      Planned  Pending:      Under consideration:      Completed:   None since initial evaluation   Therapeutic  Palliative (PRN) options:   None   Pharmacotherapy  Nonopioids transferred 12/19/2019: Gabapentin      Recent Visits No visits were found meeting these conditions. Showing recent visits within past 90 days and meeting all other requirements Today's Visits Date Type Provider Dept  04/04/23 Office Visit Delano Metz, MD [facility]  Showing today's visits and meeting all other requirements Future Appointments No visits were found meeting these conditions. Showing future appointments within next 90 days and meeting all other requirements  I discussed the assessment and treatment plan with the patient. The patient was provided an opportunity to ask questions and all were answered. The patient agreed with the plan and demonstrated an understanding of the instructions.  Patient advised to call back or seek an in-person evaluation if the symptoms or condition worsens.  Duration of encounter: 30 minutes.  Total time on encounter, as per AMA guidelines included both the face-to-face and non-face-to-face time personally spent by the physician and/or other qualified health care professional(s) on the day of the encounter (includes time in activities that require the  physician or other qualified health care professional and does not include time in activities normally performed by clinical staff). Physician's time may include the following activities when performed: Preparing to see the patient (e.g., pre-charting review of records, searching for previously ordered imaging, lab work, and nerve conduction tests) Review  of prior analgesic pharmacotherapies. Reviewing PMP Interpreting ordered tests (e.g., lab work, imaging, nerve conduction tests) Performing post-procedure evaluations, including interpretation of diagnostic procedures Obtaining and/or reviewing separately obtained history Performing a medically appropriate examination and/or evaluation Counseling and educating the patient/family/caregiver Ordering medications, tests, or procedures Referring and communicating with other health care professionals (when not separately reported) Documenting clinical information in the electronic or other health record Independently interpreting results (not separately reported) and communicating results to the patient/ family/caregiver Care coordination (not separately reported)  Note by: Oswaldo Done, MD Date: 04/04/2023; Time: 8:17 AM

## 2023-04-04 ENCOUNTER — Ambulatory Visit: Payer: PRIVATE HEALTH INSURANCE | Attending: Pain Medicine | Admitting: Pain Medicine

## 2023-04-04 ENCOUNTER — Encounter: Payer: Self-pay | Admitting: Pain Medicine

## 2023-04-04 VITALS — BP 134/73 | HR 90 | Temp 97.8°F | Resp 18 | Ht 70.0 in | Wt 195.0 lb

## 2023-04-04 DIAGNOSIS — G894 Chronic pain syndrome: Secondary | ICD-10-CM | POA: Diagnosis not present

## 2023-04-04 DIAGNOSIS — R1032 Left lower quadrant pain: Secondary | ICD-10-CM | POA: Diagnosis not present

## 2023-04-04 DIAGNOSIS — Z79891 Long term (current) use of opiate analgesic: Secondary | ICD-10-CM | POA: Diagnosis present

## 2023-04-04 DIAGNOSIS — N50812 Left testicular pain: Secondary | ICD-10-CM

## 2023-04-04 DIAGNOSIS — M792 Neuralgia and neuritis, unspecified: Secondary | ICD-10-CM | POA: Diagnosis present

## 2023-04-04 DIAGNOSIS — N509 Disorder of male genital organs, unspecified: Secondary | ICD-10-CM | POA: Diagnosis not present

## 2023-04-04 DIAGNOSIS — Z79899 Other long term (current) drug therapy: Secondary | ICD-10-CM | POA: Diagnosis present

## 2023-04-04 DIAGNOSIS — G8929 Other chronic pain: Secondary | ICD-10-CM | POA: Diagnosis present

## 2023-04-04 MED ORDER — NALOXONE HCL 4 MG/0.1ML NA LIQD: 1.0000 | NASAL | 1 refills | Status: AC | PRN

## 2023-04-04 MED ORDER — TRAMADOL HCL 50 MG PO TABS: 50.0000 mg | ORAL_TABLET | Freq: Four times a day (QID) | ORAL | 5 refills | Status: DC | PRN

## 2023-04-04 NOTE — Progress Notes (Signed)
 Nursing Pain Medication Assessment:  Safety precautions to be maintained throughout the outpatient stay will include: orient to surroundings, keep bed in low position, maintain call bell within reach at all times, provide assistance with transfer out of bed and ambulation.  Medication Inspection Compliance: Pill count conducted under aseptic conditions, in front of the patient. Neither the pills nor the bottle was removed from the patient's sight at any time. Once count was completed pills were immediately returned to the patient in their original bottle.  Medication: Tramadol (Ultram) Pill/Patch Count:  87 of 120 pills remain Pill/Patch Appearance: Markings consistent with prescribed medication Bottle Appearance: Standard pharmacy container. Clearly labeled. Filled Date: 02 / 11 / 2025 Last Medication intake:  Today

## 2023-04-05 ENCOUNTER — Encounter: Payer: Self-pay | Admitting: Family Medicine

## 2023-04-05 ENCOUNTER — Ambulatory Visit (INDEPENDENT_AMBULATORY_CARE_PROVIDER_SITE_OTHER): Payer: PRIVATE HEALTH INSURANCE | Admitting: Family Medicine

## 2023-04-05 VITALS — BP 123/63 | HR 72 | Ht 70.0 in | Wt 195.7 lb

## 2023-04-05 DIAGNOSIS — K602 Anal fissure, unspecified: Secondary | ICD-10-CM | POA: Diagnosis not present

## 2023-04-05 MED ORDER — NITROGLYCERIN 0.4 % RE OINT: TOPICAL_OINTMENT | RECTAL | 0 refills | Status: DC

## 2023-04-05 NOTE — Progress Notes (Signed)
      Acute visit   Patient: Dylan [name]   DOB: [birth year]   73 y.o. Male  MRN: [id number] PCP: [name], MD   Chief Complaint  Patient presents with   Rectal Pain    Internal anal pain X 2 months. Patient reports pain is intermittent and no changes since it started. Hx of hemorrhoids. No bleeding to report   Subjective    Discussed the use of AI scribe software for clinical note transcription with the patient, who gave verbal consent to proceed.  History of Present Illness   The patient, Dylan [name], presents with a two-month history of intermittent rectal pain. The pain began after the resolution of a self-diagnosed hemorrhoid, which was treated with over-the-counter Preparation H suppositories. The pain is described as low-level and is not associated with bowel movements or any changes in bowel habits. The patient denies any constipation, diarrhea, or blood in the stool. The pain is more noticeable upon waking up but is not currently present.        Review of Systems  Objective    BP 123/63 (BP Location: Left Arm, Patient Position: Sitting, Cuff Size: Normal)   Pulse 72   Ht 5\' 10"  (1.778 m)   Wt 195 lb 11.2 oz (88.8 kg)   SpO2 100%   BMI 28.08 kg/m  Physical Exam   Physical Exam   RECTAL: Remnants of an old external hemorrhoid. Small anal fissure, mostly healed. Internal hemorrhoid present.       No results found for any visits on 04/05/23.  Assessment & Plan     Problem List Items Addressed This Visit   None Visit Diagnoses       Anal fissure    -  Primary       Assessment and Plan    Anal Fissure Intermittent rectal pain for two months following the resolution of hemorrhoids. Physical examination revealed a small, mostly healed anal fissure, likely the source of the pain. Discussed nitroglycerin ointment to promote healing by increasing blood flow. Informed about common side effect of headaches and advised discontinuation if severe. Expected  healing time is approximately one week, with continuation if pain persists. - Prescribe nitroglycerin ointment for topical application. - Monitor for headaches and discontinue if severe. - Use ointment for one week and reassess pain.  Internal Hemorrhoid Internal hemorrhoid identified on physical examination. No associated pain or bleeding reported. Internal hemorrhoids typically do not cause pain due to lack of pain-sensitive nerves. - No specific treatment required.  External Hemorrhoid (Resolved) Remnants of an old external hemorrhoid with no current active hemorrhoid present. - No further treatment required.  Follow-up - Send prescription for nitroglycerin ointment to [facility]. - Follow up if symptoms persist or worsen after one week.         Meds ordered this encounter  Medications   Nitroglycerin 0.4 % OINT    Sig: Apply 1 inch (375 mg) ointment intra-anally every 12 hours for anal fissure for 1 wk or until resolution    Dispense:  30 g    Refill:  0     Return if symptoms worsen or fail to improve.      Shirlee Latch, MD  [facility] [phone] (phone) [phone] (fax)  [facility]

## 2023-05-12 DIAGNOSIS — K080 Exfoliation of teeth due to systemic causes: Secondary | ICD-10-CM | POA: Diagnosis not present

## 2023-05-31 DIAGNOSIS — H401332 Pigmentary glaucoma, bilateral, moderate stage: Secondary | ICD-10-CM | POA: Diagnosis not present

## 2023-06-08 DIAGNOSIS — K080 Exfoliation of teeth due to systemic causes: Secondary | ICD-10-CM | POA: Diagnosis not present

## 2023-06-15 ENCOUNTER — Ambulatory Visit (INDEPENDENT_AMBULATORY_CARE_PROVIDER_SITE_OTHER): Payer: Self-pay | Admitting: Family Medicine

## 2023-06-15 ENCOUNTER — Encounter: Payer: Self-pay | Admitting: Family Medicine

## 2023-06-15 VITALS — BP 133/74 | HR 75 | Ht 70.0 in | Wt 198.0 lb

## 2023-06-15 DIAGNOSIS — N401 Enlarged prostate with lower urinary tract symptoms: Secondary | ICD-10-CM | POA: Diagnosis not present

## 2023-06-15 DIAGNOSIS — G2581 Restless legs syndrome: Secondary | ICD-10-CM

## 2023-06-15 DIAGNOSIS — F5101 Primary insomnia: Secondary | ICD-10-CM | POA: Diagnosis not present

## 2023-06-15 DIAGNOSIS — Z23 Encounter for immunization: Secondary | ICD-10-CM | POA: Diagnosis not present

## 2023-06-15 DIAGNOSIS — K601 Chronic anal fissure: Secondary | ICD-10-CM | POA: Diagnosis not present

## 2023-06-15 DIAGNOSIS — Z1211 Encounter for screening for malignant neoplasm of colon: Secondary | ICD-10-CM

## 2023-06-15 DIAGNOSIS — R972 Elevated prostate specific antigen [PSA]: Secondary | ICD-10-CM

## 2023-06-15 MED ORDER — NIFEDIPINE 0.3 % OINTMENT: 1.0000 | TOPICAL_OINTMENT | Freq: Four times a day (QID) | CUTANEOUS | 0 refills | Status: DC

## 2023-06-15 MED ORDER — ROPINIROLE HCL 0.5 MG PO TABS: 0.5000 mg | ORAL_TABLET | Freq: Every day | ORAL | 2 refills | Status: DC

## 2023-06-15 MED ORDER — TAMSULOSIN HCL 0.4 MG PO CAPS: 0.4000 mg | ORAL_CAPSULE | Freq: Every day | ORAL | 1 refills | Status: DC

## 2023-06-15 NOTE — Assessment & Plan Note (Signed)
 Anal fissure Intermittent anal fissure with recurrent anal pain. Previous nitroglycerin  ointment treatment was not pursued due to cost. No current pain or visible fissures on examination. No signs of infection or hemorrhoids. - Prescribe topical nifedipine ointment - Recommend sitz baths and stool softeners such as Miralax - Increase dietary fiber intake - Refer to gastroenterology for colonoscopy and further management

## 2023-06-15 NOTE — Patient Instructions (Signed)
  VISIT SUMMARY: Today, you were seen for recurrent anal pain, restless legs syndrome, and a medication refill. We discussed your symptoms, reviewed your current medications, and made some adjustments to your treatment plan.  YOUR PLAN: -RESTLESS LEGS SYNDROME: Restless legs syndrome is a condition that causes an uncontrollable urge to move your legs, usually due to an uncomfortable sensation. It has been significantly impacting your sleep. We will start you on Requip 0.5 mg at bedtime to help manage your symptoms. Additionally, we will conduct some blood tests to check for any underlying causes such as anemia or other metabolic issues.  -BENIGN PROSTATIC HYPERPLASIA: Benign prostatic hyperplasia is an enlarged prostate gland that can cause urinary problems. You have had Urolift surgery in the past, which improved your symptoms. We will continue your current medication, Flomax  0.4 mg daily, to help manage your symptoms.  -ANAL FISSURE: An anal fissure is a small tear in the lining of the anus, which can cause pain and discomfort. Although you are not currently experiencing pain, we will prescribe topical nifedipine ointment for future use. We also recommend sitz baths, stool softeners like Miralax, and increasing your dietary fiber intake. Additionally, we will refer you to gastroenterology for a colonoscopy and further management.  INSTRUCTIONS: Please follow up with the recommended blood tests (CBC, CMP, A1c, TSH, T4, T3, B12, folate, and magnesium levels) to help us  identify any underlying causes of your restless legs syndrome. Also, schedule an appointment with gastroenterology for a colonoscopy. Continue taking Flomax  0.4 mg daily as prescribed. If you experience any new or worsening symptoms, please contact our office.                      Contains text generated by [employer]bridge.                                 Contains text generated by [employer]bridge.

## 2023-06-15 NOTE — Assessment & Plan Note (Signed)
 Chronic restless legs syndrome significantly impacting sleep, with only 2-4 hours of sleep per night. Symptoms have intensified over the past few months. No previous medication trials. Differential includes potential anemia or other metabolic causes. - Order CBC, CMP, A1c, TSH, T4, T3, B12, folate, and magnesium levels - Prescribe Requip 0.5 mg at bedtime - Evaluate lab results for potential underlying causes

## 2023-06-15 NOTE — Assessment & Plan Note (Signed)
 Benign prostatic hyperplasia, chronic, stable  Benign prostatic hyperplasia with previous Urolift surgery. Reports improved urination post-surgery. Continues Flomax  for symptom management. - Refill Flomax  0.4 mg daily

## 2023-06-15 NOTE — Progress Notes (Signed)
 Established patient visit   Patient: Dylan [name]   DOB: 1950/11/15   73 y.o. Male  MRN: [id number] Visit Date: 06/15/2023  Today's healthcare provider: Mimi Alt, MD   Chief Complaint  Patient presents with   Follow-up    Returning Anal pain, medication and restless leg    Subjective     HPI     Follow-up    Additional comments: Returning Anal pain, medication and restless leg       Last edited by Bart Lieu, CMA on 06/15/2023  8:08 AM.       Discussed the use of AI scribe software for clinical note transcription with the patient, who gave verbal consent to proceed.  History of Present Illness Dylan [name] "[name]" is a 73 year old male who presents with recurrent anal pain and medication refill.  He experiences recurrent anal pain associated with anal fissures. Initially evaluated in March, he was prescribed a medication that he did not take due to cost. The pain improved on its own but recurred about two weeks ago. He describes the pain as intermittent, feeling like 'tender tissue' or 'paper cuts'. He has not used sitz baths or stool softeners but has tried using a hot, moist towel for relief. No regular constipation, and the pain is in the same area as before.  He is experiencing symptoms of restless legs, significantly impacting his sleep over the past year. He gets only two to four hours of sleep per night, with restless legs being the primary cause. The symptoms have intensified over the last few months, affecting him even during the day when sitting. He has not tried any medications for this condition yet. He takes a daily magnesium tablet.  He requests a refill for Flomax  0.4 mg daily, which he uses for an enlarged prostate. He has had Urolift surgery in the past, which improved his urinary symptoms.     Past Medical History:  Diagnosis Date   Genital disorder, male 08/16/2005   Hypertension    Lumbago 01/17/2009   Pain syndrome,  chronic    Sciatica     Medications: Outpatient Medications Prior to Visit  Medication Sig   atorvastatin  (LIPITOR) 40 MG tablet Take 1 tablet (40 mg total) by mouth daily.   hydrochlorothiazide  (HYDRODIURIL ) 25 MG tablet TAKE ONE TABLET (25 MG TOTAL) BY MOUTH DAILY   latanoprost (XALATAN) 0.005 % ophthalmic solution Place into both eyes daily.    traMADol  (ULTRAM ) 50 MG tablet Take 1 tablet (50 mg total) by mouth every 6 (six) hours as needed for severe pain (pain score 7-10). Each refill must last 30 days.   valsartan  (DIOVAN ) 40 MG tablet Take 1 tablet (40 mg total) by mouth daily.   [DISCONTINUED] tamsulosin  (FLOMAX ) 0.4 MG CAPS capsule Take 0.4 mg by mouth.   naloxone  (NARCAN ) nasal spray 4 mg/0.1 mL Place 1 spray into the nose as needed for up to 365 doses (for opioid-induced respiratory depresssion). In case of emergency (overdose), spray once into each nostril. If no response within 3 minutes, repeat application and call 911. (Patient not taking: Reported on 06/15/2023)   [DISCONTINUED] Nitroglycerin  0.4 % OINT Apply 1 inch (375 mg) ointment intra-anally every 12 hours for anal fissure for 1 wk or until resolution (Patient not taking: Reported on 06/15/2023)   No facility-administered medications prior to visit.    Review of Systems  Last CBC Lab Results  Component Value Date   WBC 6.0 07/08/2021  HGB 13.3 07/08/2021   HCT 39.3 07/08/2021   MCV 86 07/08/2021   MCH 29.0 07/08/2021   RDW 12.7 07/08/2021   PLT 273 07/08/2021   Last metabolic panel Lab Results  Component Value Date   GLUCOSE 101 (H) 12/29/2022   NA 140 12/29/2022   K 4.0 12/29/2022   CL 100 12/29/2022   CO2 26 12/29/2022   BUN 14 12/29/2022   CREATININE 0.79 12/29/2022   EGFR 94 12/29/2022   CALCIUM  9.2 12/29/2022   PHOS 3.3 10/14/2011   PROT 6.5 12/15/2022   ALBUMIN 4.4 12/15/2022   LABGLOB 2.1 12/15/2022   AGRATIO 2.3 (H) 06/14/2022   BILITOT 0.4 12/15/2022   ALKPHOS 88 12/15/2022   AST 29  12/15/2022   ALT 25 12/15/2022   ANIONGAP 8 12/08/2018   Last hemoglobin A1c Lab Results  Component Value Date   HGBA1C 5.9 (H) 06/14/2022   Last thyroid  functions Lab Results  Component Value Date   TSH 1.090 12/15/2022   Last vitamin D  Lab Results  Component Value Date   25OHVITD2 1.2 01/19/2016   25OHVITD3 33 01/19/2016   VD25OH 25.44 (L) 12/08/2018   Last vitamin B12 and Folate Lab Results  Component Value Date   VITAMINB12 410 12/08/2018        Objective    BP 133/74   Pulse 75   Ht 5\' 10"  (1.778 m)   Wt 198 lb (89.8 kg)   SpO2 99%   BMI 28.41 kg/m  BP Readings from Last 3 Encounters:  06/15/23 133/74  04/05/23 123/63  04/04/23 134/73   Wt Readings from Last 3 Encounters:  06/15/23 198 lb (89.8 kg)  04/05/23 195 lb 11.2 oz (88.8 kg)  04/04/23 195 lb (88.5 kg)        Physical Exam  Physical Exam RECTAL: Normal rectal exam, no skin tags, fissures, or external hemorrhoids. EXTREMITIES: No lower extremity edema. No erythema on lower extremities or feet. Normal posterior tibialis and dorsalis pedis pulses bilaterally. Normal range of motion of feet and lower extremities.    No results found for any visits on 06/15/23.  Assessment & Plan     Problem List Items Addressed This Visit       Digestive   Chronic anal fissure - Primary   Anal fissure Intermittent anal fissure with recurrent anal pain. Previous nitroglycerin  ointment treatment was not pursued due to cost. No current pain or visible fissures on examination. No signs of infection or hemorrhoids. - Prescribe topical nifedipine ointment - Recommend sitz baths and stool softeners such as Miralax - Increase dietary fiber intake - Refer to gastroenterology for colonoscopy and further management      Relevant Medications   nifedipine 0.3 % ointment     Genitourinary   BPH with elevated PSA and lower urinary tract symptoms   Benign prostatic hyperplasia, chronic, stable  Benign prostatic  hyperplasia with previous Urolift surgery. Reports improved urination post-surgery. Continues Flomax  for symptom management. - Refill Flomax  0.4 mg daily      Relevant Medications   tamsulosin  (FLOMAX ) 0.4 MG CAPS capsule     Other   Restless leg   Chronic restless legs syndrome significantly impacting sleep, with only 2-4 hours of sleep per night. Symptoms have intensified over the past few months. No previous medication trials. Differential includes potential anemia or other metabolic causes. - Order CBC, CMP, A1c, TSH, T4, T3, B12, folate, and magnesium levels - Prescribe Requip 0.5 mg at bedtime - Evaluate lab results for  potential underlying causes      Relevant Medications   rOPINIRole (REQUIP) 0.5 MG tablet   Other Relevant Orders   Hemoglobin A1c   CMP14+EGFR   CBC   TSH+T4F+T3Free   B12 and Folate Panel   Magnesium   Ambulatory referral to Gastroenterology   Primary insomnia   Relevant Orders   TSH+T4F+T3Free   Other Visit Diagnoses       Colon cancer screening       Relevant Orders   Ambulatory referral to Gastroenterology        Assessment & Plan          Return in about 1 month (around 07/16/2023) for AWV.         Mimi Alt, MD  [facility] [phone] (phone) [phone] (fax)  [facility]

## 2023-06-16 DIAGNOSIS — H902 Conductive hearing loss, unspecified: Secondary | ICD-10-CM | POA: Diagnosis not present

## 2023-06-16 DIAGNOSIS — H6123 Impacted cerumen, bilateral: Secondary | ICD-10-CM | POA: Diagnosis not present

## 2023-06-16 LAB — CMP14+EGFR
ALT: 24 IU/L (ref 0–44)
AST: 29 IU/L (ref 0–40)
Albumin: 4.6 g/dL (ref 3.8–4.8)
Alkaline Phosphatase: 81 IU/L (ref 44–121)
BUN/Creatinine Ratio: 18 (ref 10–24)
BUN: 16 mg/dL (ref 8–27)
Bilirubin Total: 0.5 mg/dL (ref 0.0–1.2)
CO2: 22 mmol/L (ref 20–29)
Calcium: 9.5 mg/dL (ref 8.6–10.2)
Chloride: 100 mmol/L (ref 96–106)
Creatinine, Ser: 0.88 mg/dL (ref 0.76–1.27)
Globulin, Total: 2.4 g/dL (ref 1.5–4.5)
Glucose: 105 mg/dL — ABNORMAL HIGH (ref 70–99)
Potassium: 4.2 mmol/L (ref 3.5–5.2)
Sodium: 139 mmol/L (ref 134–144)
Total Protein: 7 g/dL (ref 6.0–8.5)
eGFR: 91 mL/min/{1.73_m2} (ref >59)

## 2023-06-16 LAB — CBC
Hematocrit: 42.4 % (ref 37.5–51.0)
Hemoglobin: 14.3 g/dL (ref 13.0–17.7)
MCH: 29.7 pg (ref 26.6–33.0)
MCHC: 33.7 g/dL (ref 31.5–35.7)
MCV: 88 fL (ref 79–97)
Platelets: 312 10*3/uL (ref 150–450)
RBC: 4.81 x10E6/uL (ref 4.14–5.80)
RDW: 12.3 % (ref 11.6–15.4)
WBC: 6.3 10*3/uL (ref 3.4–10.8)

## 2023-06-16 LAB — HEMOGLOBIN A1C
Est. average glucose Bld gHb Est-mCnc: 120 mg/dL
Hgb A1c MFr Bld: 5.8 % — ABNORMAL HIGH (ref 4.8–5.6)

## 2023-06-16 LAB — TSH+T4F+T3FREE
Free T4: 1.11 ng/dL (ref 0.82–1.77)
T3, Free: 3.5 pg/mL (ref 2.0–4.4)
TSH: 1.25 u[IU]/mL (ref 0.450–4.500)

## 2023-06-16 LAB — B12 AND FOLATE PANEL
Folate: 19.7 ng/mL (ref >3.0)
Vitamin B-12: 765 pg/mL (ref 232–1245)

## 2023-06-16 LAB — MAGNESIUM: Magnesium: 2.4 mg/dL — ABNORMAL HIGH (ref 1.6–2.3)

## 2023-06-17 ENCOUNTER — Ambulatory Visit: Payer: Self-pay | Admitting: Family Medicine

## 2023-06-29 ENCOUNTER — Telehealth: Payer: Self-pay | Admitting: Family Medicine

## 2023-06-29 ENCOUNTER — Other Ambulatory Visit: Payer: Self-pay | Admitting: Family Medicine

## 2023-06-29 DIAGNOSIS — I10 Essential (primary) hypertension: Secondary | ICD-10-CM

## 2023-06-29 MED ORDER — VALSARTAN 40 MG PO TABS: 40.0000 mg | ORAL_TABLET | Freq: Every day | ORAL | 3 refills | Status: AC

## 2023-06-29 NOTE — Telephone Encounter (Signed)
 [facility] faxed refill request for the following medications:   valsartan  (DIOVAN ) 40 MG tablet    Need new rx. [facility] has never filled this medication.  Please advise.

## 2023-07-04 DIAGNOSIS — K080 Exfoliation of teeth due to systemic causes: Secondary | ICD-10-CM | POA: Diagnosis not present

## 2023-07-29 ENCOUNTER — Ambulatory Visit (INDEPENDENT_AMBULATORY_CARE_PROVIDER_SITE_OTHER): Admitting: Family Medicine

## 2023-07-29 ENCOUNTER — Encounter: Payer: Self-pay | Admitting: Family Medicine

## 2023-07-29 VITALS — BP 132/71 | HR 69 | Ht 70.0 in | Wt 198.0 lb

## 2023-07-29 DIAGNOSIS — Z0000 Encounter for general adult medical examination without abnormal findings: Secondary | ICD-10-CM

## 2023-07-29 DIAGNOSIS — G2581 Restless legs syndrome: Secondary | ICD-10-CM

## 2023-07-29 MED ORDER — ROPINIROLE HCL ER 2 MG PO TB24: 2.0000 mg | ORAL_TABLET | Freq: Every day | ORAL | 1 refills | Status: DC

## 2023-07-29 NOTE — Patient Instructions (Signed)
 Health Maintenance, Male  Adopting a healthy lifestyle and getting preventive care are important in promoting health and wellness. Ask your health care provider about:  The right schedule for you to have regular tests and exams.  Things you can do on your own to prevent diseases and keep yourself healthy.  What should I know about diet, weight, and exercise?  Eat a healthy diet    Eat a diet that includes plenty of vegetables, fruits, low-fat dairy products, and lean protein.  Do not eat a lot of foods that are high in solid fats, added sugars, or sodium.  Maintain a healthy weight  Body mass index (BMI) is a measurement that can be used to identify possible weight problems. It estimates body fat based on height and weight. Your health care provider can help determine your BMI and help you achieve or maintain a healthy weight.  Get regular exercise  Get regular exercise. This is one of the most important things you can do for your health. Most adults should:  Exercise for at least 150 minutes each week. The exercise should increase your heart rate and make you sweat (moderate-intensity exercise).  Do strengthening exercises at least twice a week. This is in addition to the moderate-intensity exercise.  Spend less time sitting. Even light physical activity can be beneficial.  Watch cholesterol and blood lipids  Have your blood tested for lipids and cholesterol at 73 years of age, then have this test every 5 years.  You may need to have your cholesterol levels checked more often if:  Your lipid or cholesterol levels are high.  You are older than 73 years of age.  You are at high risk for heart disease.  What should I know about cancer screening?  Many types of cancers can be detected early and may often be prevented. Depending on your health history and family history, you may need to have cancer screening at various ages. This may include screening for:  Colorectal cancer.  Prostate cancer.  Skin cancer.  Lung  cancer.  What should I know about heart disease, diabetes, and high blood pressure?  Blood pressure and heart disease  High blood pressure causes heart disease and increases the risk of stroke. This is more likely to develop in people who have high blood pressure readings or are overweight.  Talk with your health care provider about your target blood pressure readings.  Have your blood pressure checked:  Every 3-5 years if you are 9-95 years of age.  Every year if you are 85 years old or older.  If you are between the ages of 29 and 29 and are a current or former smoker, ask your health care provider if you should have a one-time screening for abdominal aortic aneurysm (AAA).  Diabetes  Have regular diabetes screenings. This checks your fasting blood sugar level. Have the screening done:  Once every three years after age 23 if you are at a normal weight and have a low risk for diabetes.  More often and at a younger age if you are overweight or have a high risk for diabetes.  What should I know about preventing infection?  Hepatitis B  If you have a higher risk for hepatitis B, you should be screened for this virus. Talk with your health care provider to find out if you are at risk for hepatitis B infection.  Hepatitis C  Blood testing is recommended for:  Everyone born from [birth year] through 1965.  Anyone  with known risk factors for hepatitis C.  Sexually transmitted infections (STIs)  You should be screened each year for STIs, including gonorrhea and chlamydia, if:  You are sexually active and are younger than 73 years of age.  You are older than 73 years of age and your health care provider tells you that you are at risk for this type of infection.  Your sexual activity has changed since you were last screened, and you are at increased risk for chlamydia or gonorrhea. Ask your health care provider if you are at risk.  Ask your health care provider about whether you are at high risk for HIV. Your health care provider  may recommend a prescription medicine to help prevent HIV infection. If you choose to take medicine to prevent HIV, you should first get tested for HIV. You should then be tested every 3 months for as long as you are taking the medicine.  Follow these instructions at home:  Alcohol use  Do not drink alcohol if your health care provider tells you not to drink.  If you drink alcohol:  Limit how much you have to 0-2 drinks a day.  Know how much alcohol is in your drink. In the [place] one drink equals one 12 oz bottle of beer (355 mL), one 5 oz glass of wine (148 mL), or one 1 oz glass of hard liquor (44 mL).  Lifestyle  Do not use any products that contain nicotine or tobacco. These products include cigarettes, chewing tobacco, and vaping devices, such as e-cigarettes. If you need help quitting, ask your health care provider.  Do not use street drugs.  Do not share needles.  Ask your health care provider for help if you need support or information about quitting drugs.  General instructions  Schedule regular health, dental, and eye exams.  Stay current with your vaccines.  Tell your health care provider if:  You often feel depressed.  You have ever been abused or do not feel safe at home.  Summary  Adopting a healthy lifestyle and getting preventive care are important in promoting health and wellness.  Follow your health care provider's instructions about healthy diet, exercising, and getting tested or screened for diseases.  Follow your health care provider's instructions on monitoring your cholesterol and blood pressure.  This information is not intended to replace advice given to you by your health care provider. Make sure you discuss any questions you have with your health care provider.  Document Revised: 06/09/2020 Document Reviewed: 06/09/2020  Elsevier Patient Education  2024 [employer].

## 2023-07-29 NOTE — Progress Notes (Signed)
 Subjective:   Dylan [name] is a 73 y.o. male who presents for Medicare Annual/Subsequent preventive examination.  Visit Complete: In person  Patient Medicare AWV questionnaire was completed by the patient on 07/29/23 ; I have confirmed that all information answered by patient is correct and no changes since this date.        Objective:    Today's Vitals   07/29/23 0808  BP: 132/71  Pulse: 69  SpO2: 98%  Weight: 198 lb (89.8 kg)  Height: 5' 10 (1.778 m)  PainSc: 0-No pain   Body mass index is 28.41 kg/m.     07/29/2023    8:13 AM 04/04/2023    8:11 AM 10/06/2022    7:55 AM 08/25/2021    8:35 AM 04/08/2021    8:00 AM 12/22/2017    8:44 AM 06/23/2017    8:31 AM  Advanced Directives  Does Patient Have a Medical Advance Directive? No No No No No No  Yes   Type of Advance Directive       Living will  Would patient like information on creating a medical advance directive? No - Patient declined  No - Patient declined No - Patient declined  No - Patient declined       Data saved with a previous flowsheet row definition     Current Medications (verified) Outpatient Encounter Medications as of 07/29/2023  Medication Sig   atorvastatin  (LIPITOR) 40 MG tablet Take 1 tablet (40 mg total) by mouth daily.   hydrochlorothiazide  (HYDRODIURIL ) 25 MG tablet TAKE ONE TABLET (25 MG TOTAL) BY MOUTH DAILY   latanoprost (XALATAN) 0.005 % ophthalmic solution Place into both eyes daily.    naloxone  (NARCAN ) nasal spray 4 mg/0.1 mL Place 1 spray into the nose as needed for up to 365 doses (for opioid-induced respiratory depresssion). In case of emergency (overdose), spray once into each nostril. If no response within 3 minutes, repeat application and call 911. (Patient not taking: Reported on 06/15/2023)   nifedipine  0.3 % ointment Place 1 Application rectally 4 (four) times daily.   rOPINIRole  (REQUIP ) 0.5 MG tablet Take 1 tablet (0.5 mg total) by mouth at bedtime.   tamsulosin  (FLOMAX ) 0.4 MG  CAPS capsule Take 1 capsule (0.4 mg total) by mouth daily.   traMADol  (ULTRAM ) 50 MG tablet Take 1 tablet (50 mg total) by mouth every 6 (six) hours as needed for severe pain (pain score 7-10). Each refill must last 30 days.   valsartan  (DIOVAN ) 40 MG tablet Take 1 tablet (40 mg total) by mouth daily.   No facility-administered encounter medications on file as of 07/29/2023.    Allergies (verified) Patient has no known allergies.   History: Past Medical History:  Diagnosis Date   Genital disorder, male 08/16/2005   Hypertension    Lumbago 01/17/2009   Pain syndrome, chronic    Sciatica    Past Surgical History:  Procedure Laterality Date   BACK SURGERY  02/01/2009   ruptured disk   CYSTOSCOPY WITH INSERTION OF UROLIFT     CYSTOSCOPY WITH INSERTION OF UROLIFT N/A 09/03/2021   Procedure: CYSTOSCOPY WITH INSERTION OF UROLIFT;  Surgeon: Kassie Ozell JONELLE, MD;  Location: [facility] ORS;  Service: Urology;  Laterality: N/A;   History reviewed. No pertinent family history. Social History   Socioeconomic History   Marital status: Married    Spouse name: Not on file   Number of children: Not on file   Years of education: Not on file   Highest  education level: Master's degree (e.g., MA, MS, MEng, MEd, MSW, MBA)  Occupational History   Not on file  Tobacco Use   Smoking status: Never   Smokeless tobacco: Never  Vaping Use   Vaping status: Never Used  Substance and Sexual Activity   Alcohol use: Not Currently    Comment: rarely alcohol   Drug use: Never   Sexual activity: Not on file  Other Topics Concern   Not on file  Social History Narrative   Not on file   Social Drivers of Health   Financial Resource Strain: Patient Declined (07/28/2023)   Overall Financial Resource Strain (CARDIA)    Difficulty of Paying Living Expenses: Patient declined  Food Insecurity: Patient Declined (07/28/2023)   Hunger Vital Sign    Worried About Running Out of Food in the Last Year: Patient declined     Ran Out of Food in the Last Year: Patient declined  Transportation Needs: No Transportation Needs (07/28/2023)   PRAPARE - Administrator, Civil Service (Medical): No    Lack of Transportation (Non-Medical): No  Physical Activity: Unknown (07/28/2023)   Exercise Vital Sign    Days of Exercise per Week: Patient declined    Minutes of Exercise per Session: Not on file  Stress: Patient Declined (07/28/2023)   [employer] of Occupational Health - Occupational Stress Questionnaire    Feeling of Stress: Patient declined  Social Connections: Unknown (07/28/2023)   Social Connection and Isolation Panel    Frequency of Communication with Friends and Family: Patient declined    Frequency of Social Gatherings with Friends and Family: Patient declined    Attends Religious Services: Patient declined    Database administrator or Organizations: Patient declined    Attends Engineer, structural: Not on file    Marital Status: Patient declined    Tobacco Counseling Counseling given: Not Answered   Clinical Intake:  Pre-visit preparation completed: Yes  Pain : No/denies pain Pain Score: 0-No pain     Nutritional Risks: None Diabetes: No  How often do you need to have someone help you when you read instructions, pamphlets, or other written materials from your doctor or pharmacy?: 1 - Never  Interpreter Needed?: No      Activities of Daily Living    07/29/2023    8:09 AM 07/28/2023    8:28 AM  In your present state of health, do you have any difficulty performing the following activities:  Hearing? 0 0  Vision? 1 0  Difficulty concentrating or making decisions? 0 0  Walking or climbing stairs? 0 0  Dressing or bathing? 0 0  Doing errands, shopping? 0 0  Preparing Food and eating ? N N  Using the Toilet? N N  In the past six months, have you accidently leaked urine? N N  Do you have problems with loss of bowel control? N N  Managing your Medications? N N   Managing your Finances? N N  Housekeeping or managing your Housekeeping? N N    Patient Care Team: Sharma Coyer, MD as PCP - General (Family Medicine) Tanya Glisson, MD as Consulting Physician (Pain Medicine)  Indicate any recent Medical Services you may have received from other than [facility] providers in the past year (date may be approximate).     Assessment:   This is a routine wellness examination for [place].  Hearing/Vision screen No results found.   Goals Addressed   None    Depression Screen  07/29/2023    8:17 AM 06/15/2023    8:14 AM 04/04/2023    8:12 AM 12/29/2022   10:46 AM 12/15/2022    8:39 AM 10/05/2022    2:52 PM 06/14/2022    9:04 AM  PHQ 2/9 Scores  PHQ - 2 Score 0 0 0 0 0 0 0  PHQ- 9 Score 3 3  3 3  0 0    Fall Risk    07/29/2023    8:14 AM 07/28/2023    8:28 AM 04/04/2023    8:12 AM 12/29/2022   10:46 AM 12/15/2022    8:39 AM  Fall Risk   Falls in the past year? 0 1 0 0 0  Number falls in past yr:  0  1 0  Injury with Fall?  0  0 0    MEDICARE RISK AT HOME: Medicare Risk at Home Any stairs in or around the home?: Yes If so, are there any without handrails?: Yes Home free of loose throw rugs in walkways, pet beds, electrical cords, etc?: Yes Adequate lighting in your home to reduce risk of falls?: Yes Life alert?: No Use of a cane, walker or w/c?: No Grab bars in the bathroom?: No Shower chair or bench in shower?: No Elevated toilet seat or a handicapped toilet?: No  TIMED UP AND GO:  Was the test performed?  No    Cognitive Function:        07/29/2023    8:10 AM 06/14/2022    9:06 AM  6CIT Screen  What Year? 0 points 0 points  What month? 0 points 0 points  What time? 0 points 0 points  Count back from 20 0 points 0 points  Months in reverse 0 points 0 points  Repeat phrase 6 points 4 points  Total Score 6 points 4 points    Immunizations Immunization History  Administered Date(s) Administered   Fluad  Quad(high Dose 65+) 11/10/2018, 12/20/2019, 11/26/2020, 10/27/2021   Influenza, High Dose Seasonal PF 01/06/2018, 09/29/2022   Influenza-Unspecified 01/16/2015, 01/04/2017   Moderna Covid-19 Fall Seasonal Vaccine 56yrs & older 10/31/2021, 09/29/2022   PFIZER Comirnaty(Gray Top)Covid-19 Tri-Sucrose Vaccine 07/16/2020   PFIZER(Purple Top)SARS-COV-2 Vaccination 03/20/2019, 04/10/2019, 11/05/2019   Pfizer Covid-19 Vaccine Bivalent Booster 109yrs & up 01/13/2021   Pneumococcal Conjugate-13 01/04/2017   Pneumococcal Polysaccharide-23 02/06/2019   Rsv, Bivalent, Protein Subunit Rsvpref,pf [place]) 10/31/2021   Tdap 08/16/2005, 06/15/2023   Zoster Recombinant(Shingrix ) 06/15/2023    TDAP status: Up to date  Flu Vaccine status: Up to date  Pneumococcal vaccine status: Up to date  Covid-19 vaccine status: Completed vaccines  Qualifies for Shingles Vaccine? Yes   Zostavax completed No   Shingrix  Completed?: Yes  Screening Tests Health Maintenance  Topic Date Due   Colonoscopy  01/20/2020   COVID-19 Vaccine (8 - Pfizer risk 2024-25 season) 04/01/2023   Zoster Vaccines- Shingrix  (2 of 2) 08/10/2023   INFLUENZA VACCINE  09/02/2023   Medicare Annual Wellness (AWV)  07/28/2024   DTaP/Tdap/Td (3 - Td or Tdap) 06/14/2033   Pneumococcal Vaccine: 50+ Years  Completed   Hepatitis C Screening  Completed   Hepatitis B Vaccines  Aged Out   HPV VACCINES  Aged Out   Meningococcal B Vaccine  Aged Out    Health Maintenance  Health Maintenance Due  Topic Date Due   Colonoscopy  01/20/2020   COVID-19 Vaccine (8 - Pfizer risk 2024-25 season) 04/01/2023    Colorectal cancer screening: Referral to GI placed scheduled 11/2023. Pt aware  the office will call re: appt.  Lung Cancer Screening: (Low Dose CT Chest recommended if Age 9-80 years, 20 pack-year currently smoking OR have quit w/in 15years.) does not qualify.   Lung Cancer Screening Referral: n/a  Additional Screening:  Hepatitis C  Screening: does qualify; Completed 2011  Vision Screening: Recommended annual ophthalmology exams for early detection of glaucoma and other disorders of the eye. Is the patient up to date with their annual eye exam?  Yes  Who is the provider or what is the name of the office in which the patient attends annual eye exams? [facility] in [place] q 6months for glaucoma If pt is not established with a provider, would they like to be referred to a provider to establish care? No .   Dental Screening: Recommended annual dental exams for proper oral hygiene  Diabetic Foot Exam: n/a  Community Resource Referral / Chronic Care Management: CRR required this visit?  No   CCM required this visit?  No  Physical Exam Constitutional:      General: He is not in acute distress.    Appearance: Normal appearance. He is not ill-appearing.  HENT:     Mouth/Throat:     Mouth: Mucous membranes are moist.   Eyes:     General: No scleral icterus.       Right eye: No discharge.        Left eye: No discharge.     Extraocular Movements: Extraocular movements intact.     Conjunctiva/sclera: Conjunctivae normal.     Pupils: Pupils are equal, round, and reactive to light.    Cardiovascular:     Rate and Rhythm: Normal rate and regular rhythm.     Heart sounds: Normal heart sounds.  Pulmonary:     Effort: Pulmonary effort is normal.     Breath sounds: Normal breath sounds.  Abdominal:     General: Bowel sounds are normal. There is no distension.     Palpations: Abdomen is soft.     Tenderness: There is no abdominal tenderness.   Musculoskeletal:        General: No deformity or signs of injury. Normal range of motion.     Cervical back: Normal range of motion and neck supple. No tenderness.     Right lower leg: No edema.     Left lower leg: No edema.  Lymphadenopathy:     Cervical: No cervical adenopathy.   Neurological:     Mental Status: He is alert and oriented to person, place, and  time.     Cranial Nerves: No cranial nerve deficit.     Motor: No weakness.     Gait: Gait normal.   Psychiatric:        Mood and Affect: Mood normal.        Behavior: Behavior normal.        Plan:     I have personally reviewed and noted the following in the patient's chart:   Medical and social history Use of alcohol, tobacco or illicit drugs  Current medications and supplements including opioid prescriptions. Patient is currently taking opioid prescriptions. Information provided to patient regarding non-opioid alternatives. Patient advised to discuss non-opioid treatment plan with their provider. Functional ability and status Nutritional status Physical activity Advanced directives List of other physicians Hospitalizations, surgeries, and ER visits in previous 12 months Vitals Screenings to include cognitive, depression, and falls Referrals and appointments   Assessment & Plan Restless Leg Syndrome He experiences restless leg  syndrome affecting his sleep. Initially prescribed ropinirole  0.5 mg, which provided inconsistent results with 2-4 hours of sleep. Self-adjusted to 1 mg, improving sleep to 4-6 hours but lacking daytime relief. He inquired about extended-release formulation for consistent relief. Decision made to try ropinirole  extended-release 2 mg to extend relief into the afternoon. - Prescribe ropinirole  extended-release 2 mg. - Monitor symptoms and report in 3-4 weeks via MyChart for further adjustments.  Pre-Diabetes A1c is 5.8%, indicating pre-diabetes. Lifestyle modifications can reverse this to an A1c of 5.6% or lower.  General Health Maintenance Up to date on tetanus, influenza, pneumococcal, and COVID vaccinations. Due for shingles vaccine booster and has a colonoscopy office visit scheduled for October. Hepatitis C screening completed. - Administer second dose of shingles vaccine after July 14th. - Ensure colonoscopy is scheduled following the office  visit in October. - Encourage obtaining COVID vaccine and provide documentation to the office.  Goals of Care No advanced directive in place.  Follow-up Scheduled for follow-up in six months unless medication adjustments are needed sooner. - Schedule follow-up in six months. - Contact office via MyChart if ropinirole  adjustments are needed before the next visit.    In addition, I have reviewed and discussed with patient certain preventive protocols, quality metrics, and best practice recommendations. A written personalized care plan for preventive services as well as general preventive health recommendations were provided to patient.     Rockie Agent, MD   07/29/2023

## 2023-08-08 DIAGNOSIS — D225 Melanocytic nevi of trunk: Secondary | ICD-10-CM | POA: Diagnosis not present

## 2023-08-08 DIAGNOSIS — D2261 Melanocytic nevi of right upper limb, including shoulder: Secondary | ICD-10-CM | POA: Diagnosis not present

## 2023-08-08 DIAGNOSIS — D2262 Melanocytic nevi of left upper limb, including shoulder: Secondary | ICD-10-CM | POA: Diagnosis not present

## 2023-08-08 DIAGNOSIS — L570 Actinic keratosis: Secondary | ICD-10-CM | POA: Diagnosis not present

## 2023-08-08 DIAGNOSIS — D2272 Melanocytic nevi of left lower limb, including hip: Secondary | ICD-10-CM | POA: Diagnosis not present

## 2023-08-11 ENCOUNTER — Other Ambulatory Visit: Payer: Self-pay | Admitting: Family Medicine

## 2023-08-11 DIAGNOSIS — I10 Essential (primary) hypertension: Secondary | ICD-10-CM

## 2023-08-17 ENCOUNTER — Ambulatory Visit (INDEPENDENT_AMBULATORY_CARE_PROVIDER_SITE_OTHER)

## 2023-08-17 DIAGNOSIS — Z23 Encounter for immunization: Secondary | ICD-10-CM

## 2023-08-17 NOTE — Progress Notes (Signed)
 Patient presents for second vaccine in 2 shot series of Shingrix .  Consent/screening form completed, signed by patient and reviewed by CMA. Vaccine was administered to patient without incident.  Patient had no allergic or adverse event after 15 minutes observation.

## 2023-08-18 DIAGNOSIS — M9903 Segmental and somatic dysfunction of lumbar region: Secondary | ICD-10-CM | POA: Diagnosis not present

## 2023-08-18 DIAGNOSIS — M5416 Radiculopathy, lumbar region: Secondary | ICD-10-CM | POA: Diagnosis not present

## 2023-08-18 DIAGNOSIS — M62830 Muscle spasm of back: Secondary | ICD-10-CM | POA: Diagnosis not present

## 2023-08-18 DIAGNOSIS — M9904 Segmental and somatic dysfunction of sacral region: Secondary | ICD-10-CM | POA: Diagnosis not present

## 2023-08-19 DIAGNOSIS — M9904 Segmental and somatic dysfunction of sacral region: Secondary | ICD-10-CM | POA: Diagnosis not present

## 2023-08-19 DIAGNOSIS — M9903 Segmental and somatic dysfunction of lumbar region: Secondary | ICD-10-CM | POA: Diagnosis not present

## 2023-08-19 DIAGNOSIS — M62830 Muscle spasm of back: Secondary | ICD-10-CM | POA: Diagnosis not present

## 2023-08-19 DIAGNOSIS — M5416 Radiculopathy, lumbar region: Secondary | ICD-10-CM | POA: Diagnosis not present

## 2023-08-22 DIAGNOSIS — M62830 Muscle spasm of back: Secondary | ICD-10-CM | POA: Diagnosis not present

## 2023-08-22 DIAGNOSIS — M9903 Segmental and somatic dysfunction of lumbar region: Secondary | ICD-10-CM | POA: Diagnosis not present

## 2023-08-22 DIAGNOSIS — M9904 Segmental and somatic dysfunction of sacral region: Secondary | ICD-10-CM | POA: Diagnosis not present

## 2023-08-22 DIAGNOSIS — M5416 Radiculopathy, lumbar region: Secondary | ICD-10-CM | POA: Diagnosis not present

## 2023-08-31 DIAGNOSIS — K080 Exfoliation of teeth due to systemic causes: Secondary | ICD-10-CM | POA: Diagnosis not present

## 2023-09-01 DIAGNOSIS — K080 Exfoliation of teeth due to systemic causes: Secondary | ICD-10-CM | POA: Diagnosis not present

## 2023-09-20 DIAGNOSIS — K080 Exfoliation of teeth due to systemic causes: Secondary | ICD-10-CM | POA: Diagnosis not present

## 2023-09-26 ENCOUNTER — Encounter: Payer: Self-pay | Admitting: Nurse Practitioner

## 2023-09-26 ENCOUNTER — Ambulatory Visit: Payer: PRIVATE HEALTH INSURANCE | Attending: Pain Medicine | Admitting: Nurse Practitioner

## 2023-09-26 DIAGNOSIS — N509 Disorder of male genital organs, unspecified: Secondary | ICD-10-CM | POA: Insufficient documentation

## 2023-09-26 DIAGNOSIS — R1032 Left lower quadrant pain: Secondary | ICD-10-CM | POA: Insufficient documentation

## 2023-09-26 DIAGNOSIS — Z79899 Other long term (current) drug therapy: Secondary | ICD-10-CM | POA: Insufficient documentation

## 2023-09-26 DIAGNOSIS — G894 Chronic pain syndrome: Secondary | ICD-10-CM | POA: Insufficient documentation

## 2023-09-26 DIAGNOSIS — G8929 Other chronic pain: Secondary | ICD-10-CM | POA: Diagnosis not present

## 2023-09-26 DIAGNOSIS — M792 Neuralgia and neuritis, unspecified: Secondary | ICD-10-CM | POA: Diagnosis not present

## 2023-09-26 DIAGNOSIS — Z79891 Long term (current) use of opiate analgesic: Secondary | ICD-10-CM | POA: Insufficient documentation

## 2023-09-26 DIAGNOSIS — N50812 Left testicular pain: Secondary | ICD-10-CM | POA: Diagnosis not present

## 2023-09-26 MED ORDER — TRAMADOL HCL 50 MG PO TABS: 50.0000 mg | ORAL_TABLET | Freq: Four times a day (QID) | ORAL | 5 refills | Status: DC | PRN

## 2023-09-26 NOTE — Progress Notes (Signed)
 PROVIDER NOTE: Interpretation of information contained herein should be left to medically-trained personnel. Specific patient instructions are provided elsewhere under Patient Instructions section of medical record. This document was created in part using AI and STT-dictation technology, any transcriptional errors that may result from this process are unintentional.  Patient: Dylan [name]  Service: E/M   PCP: Sharma Coyer, MD  DOB: 03-19-1950  DOS: 09/26/2023  Provider: Emmy MARLA Blanch, NP  MRN: [id number]  Delivery: Face-to-face  Specialty: Interventional Pain Management  Type: Established Patient  Setting: Ambulatory outpatient facility  Specialty designation: 09  Referring Prov.: Simmons-Robinson, Makie*  Location: Outpatient office facility       History of present illness (HPI) Dylan [name], a 73 y.o. year old male, is here today because of his No primary diagnosis found.. 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   Pain Assessment: Severity of Chronic pain is reported as a 1 /10. Location: Scrotum Left/left side. Onset: More than a month ago. Quality: Aching, Sharp. Timing: Intermittent. Modifying factor(s):  SABRA Vitals:  height is 5' 10 (1.778 m) and weight is 195 lb (88.5 kg). His temperature is 97.7 F (36.5 C). His blood pressure is 127/68 and his pulse is 94. His respiration is 18 and oxygen saturation is 100%.  BMI: Estimated body mass index is 27.98 kg/m as calculated from the following:   Height as of this encounter: 5' 10 (1.778 m).   Weight as of this encounter: 195 lb (88.5 kg).  Last encounter: 04/04/2023 Last procedure: Visit date not found.  Reason for encounter: medication management.  The patient  indicates doing well with current medication regimen.  No adverse reaction or side effects reported to the medication.  The patient reports that his current medication regimen effectively controlling his pain without causing any adverse or side effects.  Pharmacotherapy Assessment   Tramadol  (Ultram ) 50 mg tablet every 6 hours as needed for pain. MME=40 Monitoring: [place] PMP: PDMP reviewed during this encounter.       Pharmacotherapy: No side-effects or adverse reactions reported. Compliance: No problems identified. Effectiveness: Clinically acceptable.  Rebecka Wolm HERO, RN  09/26/2023  8:13 AM  Sign when Signing Visit Nursing Pain Medication Assessment:  Safety precautions to be maintained throughout the outpatient stay will include: orient to surroundings, keep bed in low position, maintain call bell within reach at all times, provide assistance with transfer out of bed and ambulation.  Medication Inspection Compliance: Pill count conducted under aseptic conditions, in front of the patient. Neither the pills nor the bottle was removed from the patient's sight at any time. Once count was completed pills were immediately returned to the patient in their original bottle.  Medication: Tramadol  (Ultram ) Pill/Patch Count: 68 of 120 pills/patches remain Pill/Patch Appearance: Markings consistent with prescribed medication Bottle Appearance: Standard pharmacy container. Clearly labeled. Filled Date: 08 / 13 / 2025 Last Medication intake:  Today    UDS:  Summary  Date Value Ref Range Status  10/06/2022 Note  Final    Comment:    ==================================================================== [employer] Select 13 (MW) ==================================================================== Test                             Result  Flag       Units  Drug Present and Declared for Prescription Verification   Tramadol                        [phone]        EXPECTED   ng/mg creat   O-Desmethyltramadol             >6667        EXPECTED   ng/mg creat   N-Desmethyltramadol            1324         EXPECTED   ng/mg creat    Source of tramadol  is a prescription medication. O-desmethyltramadol    and N-desmethyltramadol are expected metabolites of tramadol .  ==================================================================== Test                      Result    Flag   Units      Ref Range   Creatinine              75               mg/dL      >=79 ==================================================================== Declared Medications:  The flagging and interpretation on this report are based on the  following declared medications.  Unexpected results may arise from  inaccuracies in the declared medications.   **Note: The testing scope of this panel includes these medications:   Tramadol  (Ultram )   **Note: The testing scope of this panel does not include the  following reported medications:   Amlodipine  (Norvasc )  Atorvastatin  (Lipitor)  Hydrochlorothiazide   Latanoprost (Xalatan) ==================================================================== For clinical consultation, please call [phone]. ====================================================================     No results found for: CBDTHCR No results found for: D8THCCBX No results found for: D9THCCBX  ROS  Constitutional: Denies any fever or chills Gastrointestinal: No reported hemesis, hematochezia, vomiting, or acute GI distress Musculoskeletal: Denies any acute onset joint swelling, redness, loss of ROM, or weakness Neurological: No reported episodes of acute onset apraxia, aphasia, dysarthria, agnosia, amnesia, paralysis, loss of coordination, or loss of consciousness  Medication Review  atorvastatin , hydrochlorothiazide , latanoprost, naloxone , rOPINIRole , tamsulosin , traMADol , and valsartan   History Review  Allergy: Dylan [name] has no known allergies. Drug: Dylan [name]  reports no history of drug  use. Alcohol:  reports that he does not currently use alcohol. Tobacco:  reports that he has never smoked. He has never used smokeless tobacco. Social: Dylan [name]  reports that he has never smoked. He has never used smokeless tobacco. He reports that he does not currently use alcohol. He reports that he does not use drugs. Medical:  has a past medical history of Genital disorder, male (08/16/2005), Hypertension, Lumbago (01/17/2009), Pain syndrome, chronic, and Sciatica. Surgical: Dylan [name]  has a past surgical history that includes Back surgery (02/01/2009); Cystoscopy with insertion of urolift; and Cystoscopy with insertion of urolift (N/A, 09/03/2021). Family: family history is not on file.  Laboratory Chemistry Profile   Renal Lab Results  Component Value Date   BUN 16 06/15/2023   CREATININE 0.88 06/15/2023   BCR 18 06/15/2023   GFRAA 104 12/20/2019   GFRNONAA 90 12/20/2019    Hepatic Lab Results  Component Value Date   AST 29 06/15/2023   ALT 24 06/15/2023   ALBUMIN 4.6 06/15/2023   ALKPHOS 81 06/15/2023   HCVAB NEGATIVE 02/28/2009    Electrolytes Lab Results  Component Value Date   NA 139 06/15/2023  K 4.2 06/15/2023   CL 100 06/15/2023   CALCIUM  9.5 06/15/2023   MG 2.4 (H) 06/15/2023   PHOS 3.3 10/14/2011    Bone Lab Results  Component Value Date   VD25OH 25.44 (L) 12/08/2018   25OHVITD1 34 01/19/2016   25OHVITD2 1.2 01/19/2016   25OHVITD3 33 01/19/2016    Inflammation (CRP: Acute Phase) (ESR: Chronic Phase) Lab Results  Component Value Date   CRP 2.0 (H) 12/08/2018   ESRSEDRATE 9 12/08/2018         Note: Above Lab results reviewed.  Recent Imaging Review  CT ABDOMEN PELVIS W WO CONTRAST CLINICAL DATA:  Gross hematuria  EXAM: CT ABDOMEN AND PELVIS WITHOUT AND WITH CONTRAST  TECHNIQUE: Multidetector CT imaging of the abdomen and pelvis was performed following the standard protocol before and following the bolus administration of intravenous  contrast.  CONTRAST:  125mL ISOVUE -300 IOPAMIDOL  (ISOVUE -300) INJECTION 61%  COMPARISON:  CT 03/23/2008  FINDINGS: Lower chest: Lung bases are clear.  Hepatobiliary: No focal hepatic lesion. No biliary duct dilatation. Gallbladder is normal. Common bile duct is normal.  Pancreas: Pancreas is normal. No ductal dilatation. No pancreatic inflammation.  Spleen: Normal spleen  Adrenals/urinary tract: Adrenal glands are normal. No nephrolithiasis ureterolithiasis. No enhancing renal cortical lesion. No filling defects within collecting systems or ureters. No bladder calculi, enhancing bladder lesions, or filling defect within the bladder.  Stomach/Bowel: Stomach, small bowel, appendix, and cecum are normal. The colon and rectosigmoid colon are normal.  Vascular/Lymphatic: Abdominal aorta is normal caliber with atherosclerotic calcification. There is no retroperitoneal or periportal lymphadenopathy. No pelvic lymphadenopathy.  Reproductive: Prostate normal  Other: No free fluid.  Musculoskeletal: No aggressive osseous lesion.  IMPRESSION: 1. No explanation for hematuria. No nephrolithiasis, ureterolithiasis, enhancing renal cortical lesion, or filling defects within the collecting systems. 2. No bladder stones or filling defects in the bladder which does not excluded a bladder lesion.  Electronically Signed   By: Jackquline Boxer M.D.   On: 11/26/2016 09:22 Note: Reviewed        Physical Exam  Vitals: BP 127/68   Pulse 94   Temp 97.7 F (36.5 C)   Resp 18   Ht 5' 10 (1.778 m)   Wt 195 lb (88.5 kg)   SpO2 100%   BMI 27.98 kg/m  BMI: Estimated body mass index is 27.98 kg/m as calculated from the following:   Height as of this encounter: 5' 10 (1.778 m).   Weight as of this encounter: 195 lb (88.5 kg). Ideal: Ideal body weight: 73 kg (160 lb 15 oz) Adjusted ideal body weight: 79.2 kg (174 lb 9 oz) General appearance: Well nourished, well developed, and well  hydrated. In no apparent acute distress Mental status: Alert, oriented x 3 (person, place, & time)       Respiratory: No evidence of acute respiratory distress Eyes: PERLA   Assessment   Diagnosis Status  1. Orchialgia (1ry area of Pain) (Left)   2. 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 Neurogenic pain   5. Chronic pain syndrome   6. Pharmacologic therapy   7. Chronic use of opiate for therapeutic purpose   8. Encounter for medication management   9. Encounter for chronic pain management    Controlled Controlled Controlled   Updated Problems: No problems updated.  Plan of Care  Problem-specific:  Assessment and Plan We will continue on current medication regimen.  Prescribing drug monitoring (  PDMP) reviewed; findings consistent with the use of prescribed medication and no evidence of narcotic misuse or abuse. Routine UDS ordered today.  No other new issues or problems reported at this visit.  Schedule follow-up in 6 months for medication management with Symone Cornman, NP.  Dylan [name] has a current medication list which includes the following long-term medication(s): atorvastatin , hydrochlorothiazide , ropinirole , valsartan , and tramadol .  Pharmacotherapy (Medications Ordered): Meds ordered this encounter  Medications   traMADol  (ULTRAM ) 50 MG tablet    Sig: Take 1 tablet (50 mg total) by mouth every 6 (six) hours as needed for severe pain (pain score 7-10). Each refill must last 30 days.    Dispense:  120 tablet    Refill:  5    Dispense 1 day early if closed on refill date. Avoid benzodiazepines within 8 hours of opioids. Do not send refill requests.   Orders:  Orders Placed This Encounter  Procedures   Tox[employer]SU[employer] Select 13 (MW), Urine    Volume: 30 ml(s). Minimum 3 ml of urine is needed. Document temperature of fresh sample. Indications: Long term (current) use of opiate analgesic (S20.108)    Release to patient:    Immediate        Return in about 6 months (around 03/28/2024) for (F2F), (MM), Emmy Blanch NP.    Recent Visits No visits were found meeting these conditions. Showing recent visits within past 90 days and meeting all other requirements Today's Visits Date Type Provider Dept  09/26/23 Office Visit Alese Furniss K, NP [facility]  Showing today's visits and meeting all other requirements Future Appointments No visits were found meeting these conditions. Showing future appointments within next 90 days and meeting all other requirements  I discussed the assessment and treatment plan with the patient. The patient was provided an opportunity to ask questions and all were answered. The patient agreed with the plan and demonstrated an understanding of the instructions.  Patient advised to call back or seek an in-person evaluation if the symptoms or condition worsens.  Duration of encounter: 30 minutes.  Total time on encounter, as per AMA guidelines included both the face-to-face and non-face-to-face time personally spent by the physician and/or other qualified health care professional(s) on the day of the encounter (includes time in activities that require the physician or other qualified health care professional and does not include time in activities normally performed by clinical staff). Physician's time may include the following activities when performed: Preparing to see the patient (e.g., pre-charting review of records, searching for previously ordered imaging, lab work, and nerve conduction tests) Review of prior analgesic pharmacotherapies. Reviewing PMP Interpreting ordered tests (e.g., lab work, imaging, nerve conduction tests) Performing post-procedure evaluations, including interpretation of diagnostic procedures Obtaining and/or reviewing separately obtained history Performing a medically appropriate examination and/or evaluation Counseling and educating the  patient/family/caregiver Ordering medications, tests, or procedures Referring and communicating with other health care professionals (when not separately reported) Documenting clinical information in the electronic or other health record Independently interpreting results (not separately reported) and communicating results to the patient/ family/caregiver Care coordination (not separately reported)  Note by: Nuchem Grattan K Ladena Jacquez, NP (TTS and AI technology used. I apologize for any typographical errors that were not detected and corrected.) Date: 09/26/2023; Time: 8:27 AM

## 2023-09-26 NOTE — Progress Notes (Signed)
 Nursing Pain Medication Assessment:  Safety precautions to be maintained throughout the outpatient stay will include: orient to surroundings, keep bed in low position, maintain call bell within reach at all times, provide assistance with transfer out of bed and ambulation.  Medication Inspection Compliance: Pill count conducted under aseptic conditions, in front of the patient. Neither the pills nor the bottle was removed from the patient's sight at any time. Once count was completed pills were immediately returned to the patient in their original bottle.  Medication: Tramadol  (Ultram ) Pill/Patch Count: 68 of 120 pills/patches remain Pill/Patch Appearance: Markings consistent with prescribed medication Bottle Appearance: Standard pharmacy container. Clearly labeled. Filled Date: 08 / 13 / 2025 Last Medication intake:  Today

## 2023-09-29 LAB — TOXASSURE SELECT 13 (MW), URINE

## 2023-10-24 DIAGNOSIS — M9904 Segmental and somatic dysfunction of sacral region: Secondary | ICD-10-CM | POA: Diagnosis not present

## 2023-10-24 DIAGNOSIS — M9903 Segmental and somatic dysfunction of lumbar region: Secondary | ICD-10-CM | POA: Diagnosis not present

## 2023-10-24 DIAGNOSIS — M62830 Muscle spasm of back: Secondary | ICD-10-CM | POA: Diagnosis not present

## 2023-10-24 DIAGNOSIS — M5416 Radiculopathy, lumbar region: Secondary | ICD-10-CM | POA: Diagnosis not present

## 2023-10-31 DIAGNOSIS — M62830 Muscle spasm of back: Secondary | ICD-10-CM | POA: Diagnosis not present

## 2023-10-31 DIAGNOSIS — M5416 Radiculopathy, lumbar region: Secondary | ICD-10-CM | POA: Diagnosis not present

## 2023-10-31 DIAGNOSIS — M9904 Segmental and somatic dysfunction of sacral region: Secondary | ICD-10-CM | POA: Diagnosis not present

## 2023-10-31 DIAGNOSIS — M9903 Segmental and somatic dysfunction of lumbar region: Secondary | ICD-10-CM | POA: Diagnosis not present

## 2023-11-02 DIAGNOSIS — M9903 Segmental and somatic dysfunction of lumbar region: Secondary | ICD-10-CM | POA: Diagnosis not present

## 2023-11-02 DIAGNOSIS — M5416 Radiculopathy, lumbar region: Secondary | ICD-10-CM | POA: Diagnosis not present

## 2023-11-02 DIAGNOSIS — M9904 Segmental and somatic dysfunction of sacral region: Secondary | ICD-10-CM | POA: Diagnosis not present

## 2023-11-02 DIAGNOSIS — M62830 Muscle spasm of back: Secondary | ICD-10-CM | POA: Diagnosis not present

## 2023-11-09 DIAGNOSIS — M9903 Segmental and somatic dysfunction of lumbar region: Secondary | ICD-10-CM | POA: Diagnosis not present

## 2023-11-09 DIAGNOSIS — M9904 Segmental and somatic dysfunction of sacral region: Secondary | ICD-10-CM | POA: Diagnosis not present

## 2023-11-09 DIAGNOSIS — M5416 Radiculopathy, lumbar region: Secondary | ICD-10-CM | POA: Diagnosis not present

## 2023-11-09 DIAGNOSIS — M62830 Muscle spasm of back: Secondary | ICD-10-CM | POA: Diagnosis not present

## 2023-11-14 DIAGNOSIS — Z1211 Encounter for screening for malignant neoplasm of colon: Secondary | ICD-10-CM | POA: Diagnosis not present

## 2023-11-14 DIAGNOSIS — K5909 Other constipation: Secondary | ICD-10-CM | POA: Diagnosis not present

## 2023-11-22 DIAGNOSIS — H900 Conductive hearing loss, bilateral: Secondary | ICD-10-CM | POA: Diagnosis not present

## 2023-11-22 DIAGNOSIS — K080 Exfoliation of teeth due to systemic causes: Secondary | ICD-10-CM | POA: Diagnosis not present

## 2023-11-22 DIAGNOSIS — H6123 Impacted cerumen, bilateral: Secondary | ICD-10-CM | POA: Diagnosis not present

## 2023-11-30 DIAGNOSIS — M9904 Segmental and somatic dysfunction of sacral region: Secondary | ICD-10-CM | POA: Diagnosis not present

## 2023-11-30 DIAGNOSIS — M5416 Radiculopathy, lumbar region: Secondary | ICD-10-CM | POA: Diagnosis not present

## 2023-11-30 DIAGNOSIS — M62830 Muscle spasm of back: Secondary | ICD-10-CM | POA: Diagnosis not present

## 2023-11-30 DIAGNOSIS — M9903 Segmental and somatic dysfunction of lumbar region: Secondary | ICD-10-CM | POA: Diagnosis not present

## 2023-12-01 DIAGNOSIS — H401332 Pigmentary glaucoma, bilateral, moderate stage: Secondary | ICD-10-CM | POA: Diagnosis not present

## 2023-12-08 ENCOUNTER — Ambulatory Visit: Payer: Self-pay

## 2023-12-08 DIAGNOSIS — K640 First degree hemorrhoids: Secondary | ICD-10-CM | POA: Diagnosis not present

## 2023-12-08 DIAGNOSIS — Z1211 Encounter for screening for malignant neoplasm of colon: Secondary | ICD-10-CM | POA: Diagnosis not present

## 2023-12-28 ENCOUNTER — Other Ambulatory Visit: Payer: Self-pay | Admitting: Family Medicine

## 2023-12-28 DIAGNOSIS — M9903 Segmental and somatic dysfunction of lumbar region: Secondary | ICD-10-CM | POA: Diagnosis not present

## 2023-12-28 DIAGNOSIS — M5416 Radiculopathy, lumbar region: Secondary | ICD-10-CM | POA: Diagnosis not present

## 2023-12-28 DIAGNOSIS — G2581 Restless legs syndrome: Secondary | ICD-10-CM

## 2023-12-28 DIAGNOSIS — M9904 Segmental and somatic dysfunction of sacral region: Secondary | ICD-10-CM | POA: Diagnosis not present

## 2023-12-28 DIAGNOSIS — M62830 Muscle spasm of back: Secondary | ICD-10-CM | POA: Diagnosis not present

## 2024-01-02 ENCOUNTER — Other Ambulatory Visit: Payer: Self-pay | Admitting: Family Medicine

## 2024-01-02 DIAGNOSIS — G2581 Restless legs syndrome: Secondary | ICD-10-CM

## 2024-01-19 ENCOUNTER — Ambulatory Visit: Admitting: Family Medicine

## 2024-01-19 ENCOUNTER — Encounter: Payer: Self-pay | Admitting: Family Medicine

## 2024-01-19 VITALS — BP 110/70 | HR 70 | Ht 70.0 in | Wt 198.5 lb

## 2024-01-19 DIAGNOSIS — I10 Essential (primary) hypertension: Secondary | ICD-10-CM | POA: Diagnosis not present

## 2024-01-19 DIAGNOSIS — F5101 Primary insomnia: Secondary | ICD-10-CM

## 2024-01-19 DIAGNOSIS — M5432 Sciatica, left side: Secondary | ICD-10-CM

## 2024-01-19 DIAGNOSIS — E781 Pure hyperglyceridemia: Secondary | ICD-10-CM | POA: Diagnosis not present

## 2024-01-19 DIAGNOSIS — E559 Vitamin D deficiency, unspecified: Secondary | ICD-10-CM | POA: Diagnosis not present

## 2024-01-19 DIAGNOSIS — G2581 Restless legs syndrome: Secondary | ICD-10-CM | POA: Diagnosis not present

## 2024-01-19 DIAGNOSIS — N50812 Left testicular pain: Secondary | ICD-10-CM | POA: Diagnosis not present

## 2024-01-19 DIAGNOSIS — G894 Chronic pain syndrome: Secondary | ICD-10-CM | POA: Diagnosis not present

## 2024-01-19 MED ORDER — GABAPENTIN 300 MG PO CAPS: 300.0000 mg | ORAL_CAPSULE | Freq: Three times a day (TID) | ORAL | 3 refills | Status: AC

## 2024-01-19 MED ORDER — TRAZODONE HCL 100 MG PO TABS: 100.0000 mg | ORAL_TABLET | Freq: Every day | ORAL | 1 refills | Status: AC

## 2024-01-19 MED ORDER — ROPINIROLE HCL ER 4 MG PO TB24: 4.0000 mg | ORAL_TABLET | Freq: Every day | ORAL | Status: DC

## 2024-01-19 NOTE — Assessment & Plan Note (Signed)
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 Previous use of gabapentin  was discontinued by him. Symptoms include nerve spasms and numbness in the left foot. Discussed potential benefit of restarting gabapentin  for neuropathic pain management. - Restarted gabapentin  300 mg at night, with option to titrate up to three times daily as needed.

## 2024-01-19 NOTE — Assessment & Plan Note (Signed)
 Chronic  Well controlled  Blood pressure elevated at 143/71 mmHg, possibly due to recent stress. Current medications include hydrochlorothiazide  and valsartan . - Rechecked blood pressure during the visit, normalized to 110/70  - continue valsartan  40mg  daily and hydrochlorothiazide  25mg  daily  - CMP ordered today

## 2024-01-19 NOTE — Assessment & Plan Note (Signed)
 Restless legs syndrome Chronic restless legs syndrome with improvement on self-adjusted dose of Requip  (ropinirole ) to 4 mg daily. No side effects reported. Prefers to maintain current dose rather than increasing further. - Continue Requip  at 4 mg daily. - Will adjust prescription to 4 mg dose when refill is due.

## 2024-01-19 NOTE — Assessment & Plan Note (Signed)
 Chronic  F/u with pain management  Continue tramadol  as managed by specialist  Restarting gabapentin  300mg  at bedtime and titrate up to TID if needed

## 2024-01-19 NOTE — Patient Instructions (Signed)
 To keep you healthy, please keep in mind the following health maintenance items that you are due for:   Health Maintenance Due  Topic Date Due   COVID-19 Vaccine (8 - 2025-26 season) 10/03/2023     Best Wishes,   Dr. Lang

## 2024-01-19 NOTE — Progress Notes (Unsigned)
 Established patient visit   Patient: Dylan [name]   DOB: [birth year]   73 y.o. Male  MRN: [id number] Visit Date: 01/19/2024  Today's healthcare provider: Rockie Agent, MD   Chief Complaint  Patient presents with   Medical Management of Chronic Issues    Patient is present for 50mo f/u with PCP   Subjective     HPI     Medical Management of Chronic Issues    Additional comments: Patient is present for 50mo f/u with PCP      Last edited by Cherry Chiquita HERO, CMA on 01/19/2024  8:24 AM.       Discussed the use of AI scribe software for clinical note transcription with the patient, who gave verbal consent to proceed.  History of Present Illness Dylan [name] is a 73 year old male with chronic hypertension and hyperlipidemia who presents for a follow-up visit.  He has chronic hypertension managed with hydrochlorothiazide  25 mg daily and valsartan  40 mg daily. He also has hyperlipidemia and is on atorvastatin  40 mg daily.  He experiences restless legs and sleep disturbances. Initially prescribed ropinirole  1 mg, the dose was increased to 2 mg daily, but he continued to struggle with sleep, getting only 3-4 hours per night. Two weeks ago, he began taking 4 mg daily, which has significantly reduced his restless leg symptoms and slightly improved his sleep. He reports no side effects from the increased dose. He frequently awakens at night, often due to the need to urinate, which he attributes to an enlarged prostate. He also mentions occasional sinus issues affecting his breathing at night, for which he uses Breathe Right strips with good effect. He feels very tired in the evening but is unable to sleep when he goes to bed. No waking up choking or gasping for air. He wakes up every 1.5 to 2 hours, primarily to urinate. He experiences occasional sinus congestion at night.  He has chronic pain, for which he takes tramadol  50 mg every six hours as needed. He  describes a nerve spasm in his left foot, likening it to a 'Charlie horse' but nerve-related, occurring 2-3 times a day. He previously took gabapentin  for pain management but discontinued it due to taking multiple medications and not noticing a difference. He is open to retrying gabapentin  to manage these symptoms. He has a history of a ruptured disc and subsequent sciatic nerve issues, resulting in numbness on the side of his foot. He continues to experience pain in the left side of his scrotum, managed with tramadol .     Past Medical History:  Diagnosis Date   Genital disorder, male 08/16/2005   Hypertension    Lumbago 01/17/2009   Pain syndrome, chronic    Sciatica     Medications: Show/hide medication list[1]  Review of Systems  Last metabolic panel Lab Results  Component Value Date   GLUCOSE 105 (H) 06/15/2023   NA 139 06/15/2023   K 4.2 06/15/2023   CL 100 06/15/2023   CO2 22 06/15/2023   BUN 16 06/15/2023   CREATININE 0.88 06/15/2023   EGFR 91 06/15/2023   CALCIUM  9.5 06/15/2023   PHOS 3.3 10/14/2011   PROT 7.0 06/15/2023   ALBUMIN 4.6 06/15/2023   LABGLOB 2.4 06/15/2023   AGRATIO 2.3 (H) 06/14/2022   BILITOT 0.5 06/15/2023   ALKPHOS 81 06/15/2023   AST 29 06/15/2023   ALT 24 06/15/2023   ANIONGAP 8 12/08/2018   Last lipids Lab Results  Component Value Date   CHOL 151 12/15/2022   HDL 43 12/15/2022   LDLCALC 70 12/15/2022   TRIG 232 (H) 12/15/2022   CHOLHDL 3.5 12/15/2022   The 10-year ASCVD risk score (Arnett DK, et al., 2019) is: 18.8%    {See past labs  Heme  Chem  Endocrine  Serology  Results Review (optional):1}   Objective    BP 110/70 (Cuff Size: Normal)   Pulse 70   Ht 5' 10 (1.778 m)   Wt 198 lb 8 oz (90 kg)   SpO2 100%   BMI 28.48 kg/m   BP Readings from Last 3 Encounters:  01/19/24 110/70  09/26/23 127/68  07/29/23 132/71   Wt Readings from Last 3 Encounters:  01/19/24 198 lb 8 oz (90 kg)  09/26/23 195 lb (88.5 kg)   07/29/23 198 lb (89.8 kg)    {See vitals history (optional):1}    Physical Exam Vitals reviewed.  Constitutional:      General: He is not in acute distress.    Appearance: Normal appearance. He is not ill-appearing.  Cardiovascular:     Rate and Rhythm: Normal rate and regular rhythm.  Pulmonary:     Effort: Pulmonary effort is normal. No respiratory distress.     Breath sounds: No wheezing, rhonchi or rales.  Neurological:     Mental Status: He is alert and oriented to person, place, and time.  Psychiatric:        Mood and Affect: Mood normal.        Behavior: Behavior normal.       No results found for any visits on 01/19/24.  Assessment & Plan     Problem List Items Addressed This Visit     Chronic pain syndrome (Chronic)   Managed by pain management specialist Dr. Rolando. Continues to use tramadol  50 mg every 6 hours as needed for severe pain. - Continue tramadol  50 mg every 6 hours as needed - Continue follow-up with pain management specialist as scheduled      Relevant Medications   traZODone  (DESYREL ) 100 MG tablet   gabapentin  (NEURONTIN ) 300 MG capsule   Essential hypertension   Chronic  Well controlled  Blood pressure elevated at 143/71 mmHg, possibly due to recent stress. Current medications include hydrochlorothiazide  and valsartan . - Rechecked blood pressure during the visit, normalized to 110/70  - continue valsartan  40mg  daily and hydrochlorothiazide  25mg  daily  - CMP ordered today         Relevant Orders   CMP14+EGFR   Hypertriglyceridemia    Hypertriglyceridemia Chronic  Previous triglyceride level at 230 mg/dL. Discussed dietary modifications to reduce carbohydrate intake, particularly breads, pasta, and sugary foods. Consideration of additional medication if triglycerides remain elevated. - Ordered lipid panel to assess current triglyceride levels. - Advised dietary modifications to reduce carbohydrate intake. 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 Previous use of gabapentin  was discontinued by him. Symptoms include nerve spasms and numbness in the left foot. Discussed potential benefit of restarting gabapentin  for neuropathic pain management. - Restarted gabapentin  300 mg at night, with option to titrate up to three times daily as needed.      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 No evidence of sleep apnea. Previous trial of trazodone  was ineffective at lower doses. Discussed potential use of trazodone  at higher doses and other options like Ambien and Lunesta, noting risks of habit formation and side effects, especially in older adults. - Prescribed trazodone  100-150 mg at bedtime as needed for sleep. - Scheduled follow-up in 2 months to assess effectiveness of trazodone . -consider Lunesta 2mg  if no improvement on this regimen  -We discussed importance of sleep hygiene with specific recommendations to get out of bed if not able to fall asleep within 30 mins        Relevant Medications   traZODone  (DESYREL ) 100 MG tablet   Restless leg   Restless legs syndrome Chronic restless legs syndrome with improvement on self-adjusted dose of  Requip  (ropinirole ) to 4 mg daily. No side effects reported. Prefers to maintain current dose rather than increasing further. - Continue Requip  at 4 mg daily. - Will adjust prescription to 4 mg dose when refill is due.      Relevant Medications   rOPINIRole  (REQUIP  XL) 4 MG 24 hr tablet   Vitamin D  insufficiency    Assessment and Plan Assessment & Plan        Return in about 2 months (around 03/21/2024) for Insomnia,restless legs, left sided sciatica  .         Rockie Agent, MD  [facility] [phone] (phone) [phone] (fax)  [facility]     [1]  Outpatient Medications Prior to Visit  Medication Sig   atorvastatin  (LIPITOR) 40 MG tablet Take 1 tablet (40 mg total) by mouth daily.   hydrochlorothiazide  (HYDRODIURIL ) 25 MG tablet TAKE 1 TABLET BY MOUTH DAILY   latanoprost (XALATAN) 0.005 % ophthalmic solution Place into both eyes daily.    naloxone  (NARCAN ) nasal spray 4 mg/0.1 mL Place 1 spray into the nose as needed for up to 365 doses (for opioid-induced respiratory depresssion). In case of emergency (overdose), spray once into each nostril. If no response within 3 minutes, repeat application and call 911.   tamsulosin  (FLOMAX ) 0.4 MG CAPS capsule Take 1 capsule (0.4 mg total) by mouth daily.   valsartan  (DIOVAN ) 40 MG tablet Take 1 tablet (40 mg total) by mouth daily.   [DISCONTINUED] rOPINIRole  (REQUIP  XL) 2 MG 24 hr tablet TAKE ONE TABLET BY MOUTH AT BEDTIME   [DISCONTINUED] traMADol  (ULTRAM ) 50 MG tablet Take 1 tablet (50 mg total) by mouth every 6 (six) hours as needed for severe pain (pain score 7-10). Each refill must last 30 days.   No facility-administered medications prior to visit.

## 2024-01-19 NOTE — Assessment & Plan Note (Signed)
°  Hypertriglyceridemia Chronic  Previous triglyceride level at 230 mg/dL. Discussed dietary modifications to reduce carbohydrate intake, particularly breads, pasta, and sugary foods. Consideration of additional medication if triglycerides remain elevated. - Ordered lipid panel to assess current triglyceride levels. - Advised dietary modifications to reduce carbohydrate intake. - continue lipitor 40mg  daily  -consider adding fenofibrate 48mg  daily if TG remain >150

## 2024-01-19 NOTE — Assessment & Plan Note (Signed)
 Managed by pain management specialist Dr. Butch Penny. Continues to use tramadol 50 mg every 6 hours as needed for severe pain. - Continue tramadol 50 mg every 6 hours as needed - Continue follow-up with pain management specialist as scheduled

## 2024-01-19 NOTE — Assessment & Plan Note (Signed)
 Chronic insomnia with difficulty maintaining sleep, waking every 1.5 to 2 hours, often due to nocturia. No evidence of sleep apnea. Previous trial of trazodone  was ineffective at lower doses. Discussed potential use of trazodone  at higher doses and other options like Ambien and Lunesta, noting risks of habit formation and side effects, especially in older adults. - Prescribed trazodone  100-150 mg at bedtime as needed for sleep. - Scheduled follow-up in 2 months to assess effectiveness of trazodone . -consider Lunesta 2mg  if no improvement on this regimen  -We discussed importance of sleep hygiene with specific recommendations to get out of bed if not able to fall asleep within 30 mins

## 2024-01-20 ENCOUNTER — Ambulatory Visit: Payer: Self-pay | Admitting: Family Medicine

## 2024-01-20 LAB — CMP14+EGFR
ALT: 25 IU/L (ref 0–44)
AST: 28 IU/L (ref 0–40)
Albumin: 4.6 g/dL (ref 3.8–4.8)
Alkaline Phosphatase: 77 IU/L (ref 47–123)
BUN/Creatinine Ratio: 21 (ref 10–24)
BUN: 18 mg/dL (ref 8–27)
Bilirubin Total: 0.5 mg/dL (ref 0.0–1.2)
CO2: 24 mmol/L (ref 20–29)
Calcium: 9.7 mg/dL (ref 8.6–10.2)
Chloride: 98 mmol/L (ref 96–106)
Creatinine, Ser: 0.84 mg/dL (ref 0.76–1.27)
Globulin, Total: 2.2 g/dL (ref 1.5–4.5)
Glucose: 96 mg/dL (ref 70–99)
Potassium: 4.7 mmol/L (ref 3.5–5.2)
Sodium: 138 mmol/L (ref 134–144)
Total Protein: 6.8 g/dL (ref 6.0–8.5)
eGFR: 92 mL/min/1.73

## 2024-01-20 LAB — LIPID PANEL
Chol/HDL Ratio: 3.4 ratio (ref 0.0–5.0)
Cholesterol, Total: 150 mg/dL (ref 100–199)
HDL: 44 mg/dL
LDL Chol Calc (NIH): 74 mg/dL (ref 0–99)
Triglycerides: 193 mg/dL — ABNORMAL HIGH (ref 0–149)
VLDL Cholesterol Cal: 32 mg/dL (ref 5–40)

## 2024-02-15 ENCOUNTER — Other Ambulatory Visit: Payer: Self-pay | Admitting: Family Medicine

## 2024-02-15 DIAGNOSIS — G2581 Restless legs syndrome: Secondary | ICD-10-CM

## 2024-03-05 ENCOUNTER — Other Ambulatory Visit: Payer: Self-pay | Admitting: Family Medicine

## 2024-03-05 DIAGNOSIS — E781 Pure hyperglyceridemia: Secondary | ICD-10-CM

## 2024-03-05 DIAGNOSIS — R972 Elevated prostate specific antigen [PSA]: Secondary | ICD-10-CM

## 2024-03-22 ENCOUNTER — Encounter: Admitting: Nurse Practitioner

## 2024-04-05 ENCOUNTER — Ambulatory Visit: Admitting: Family Medicine
